# Patient Record
Sex: Female | Born: 1970 | Race: White | Hispanic: No | Marital: Married | Smoking: Never smoker
Health system: Southern US, Community
[De-identification: ages and names within clinical notes are randomized; demographics above are authoritative.]

## PROBLEM LIST (undated history)

## (undated) DIAGNOSIS — F319 Bipolar disorder, unspecified: Secondary | ICD-10-CM

## (undated) DIAGNOSIS — N200 Calculus of kidney: Secondary | ICD-10-CM

## (undated) DIAGNOSIS — I82409 Acute embolism and thrombosis of unspecified deep veins of unspecified lower extremity: Secondary | ICD-10-CM

## (undated) DIAGNOSIS — G2581 Restless legs syndrome: Secondary | ICD-10-CM

## (undated) DIAGNOSIS — K8020 Calculus of gallbladder without cholecystitis without obstruction: Secondary | ICD-10-CM

## (undated) DIAGNOSIS — K219 Gastro-esophageal reflux disease without esophagitis: Secondary | ICD-10-CM

## (undated) DIAGNOSIS — D649 Anemia, unspecified: Secondary | ICD-10-CM

## (undated) DIAGNOSIS — F419 Anxiety disorder, unspecified: Secondary | ICD-10-CM

## (undated) DIAGNOSIS — I2699 Other pulmonary embolism without acute cor pulmonale: Secondary | ICD-10-CM

## (undated) DIAGNOSIS — Z9889 Other specified postprocedural states: Secondary | ICD-10-CM

## (undated) HISTORY — PX: GASTRIC BYPASS: SHX52

## (undated) HISTORY — PX: CHOLECYSTECTOMY: SHX55

## (undated) HISTORY — PX: HERNIA REPAIR: SHX51

## (undated) HISTORY — DX: Anxiety disorder, unspecified: F41.9

## (undated) HISTORY — DX: Gastro-esophageal reflux disease without esophagitis: K21.9

## (undated) HISTORY — DX: Anemia, unspecified: D64.9

## (undated) HISTORY — DX: Calculus of gallbladder without cholecystitis without obstruction: K80.20

## (undated) HISTORY — DX: Acute embolism and thrombosis of unspecified deep veins of unspecified lower extremity: I82.409

## (undated) HISTORY — PX: KNEE ARTHROSCOPY: SUR90

## (undated) HISTORY — PX: BREAST BIOPSY: SHX20

## (undated) HISTORY — PX: TONSILLECTOMY: SUR1361

## (undated) HISTORY — PX: OTHER SURGICAL HISTORY: SHX169

---

## 2005-04-19 ENCOUNTER — Ambulatory Visit (HOSPITAL_BASED_OUTPATIENT_CLINIC_OR_DEPARTMENT_OTHER): Admission: RE | Admit: 2005-04-19 | Discharge: 2005-04-19 | Payer: Self-pay | Admitting: Orthopedic Surgery

## 2005-04-26 ENCOUNTER — Ambulatory Visit (HOSPITAL_COMMUNITY): Admission: RE | Admit: 2005-04-26 | Discharge: 2005-04-26 | Payer: Self-pay | Admitting: Orthopedic Surgery

## 2005-05-11 ENCOUNTER — Ambulatory Visit: Payer: Self-pay | Admitting: Family Medicine

## 2005-05-11 ENCOUNTER — Ambulatory Visit (HOSPITAL_COMMUNITY): Admission: RE | Admit: 2005-05-11 | Discharge: 2005-05-11 | Payer: Self-pay | Admitting: Orthopedic Surgery

## 2005-05-11 ENCOUNTER — Observation Stay (HOSPITAL_COMMUNITY): Admission: EM | Admit: 2005-05-11 | Discharge: 2005-05-14 | Payer: Self-pay | Admitting: Emergency Medicine

## 2005-05-12 ENCOUNTER — Ambulatory Visit: Payer: Self-pay | Admitting: Cardiology

## 2005-05-16 ENCOUNTER — Ambulatory Visit: Payer: Self-pay | Admitting: Family Medicine

## 2005-05-18 ENCOUNTER — Ambulatory Visit: Payer: Self-pay | Admitting: Family Medicine

## 2005-05-22 ENCOUNTER — Ambulatory Visit: Payer: Self-pay | Admitting: Family Medicine

## 2005-05-26 ENCOUNTER — Ambulatory Visit: Payer: Self-pay | Admitting: Family Medicine

## 2005-06-01 ENCOUNTER — Ambulatory Visit: Payer: Self-pay | Admitting: Family Medicine

## 2005-06-08 ENCOUNTER — Ambulatory Visit: Payer: Self-pay | Admitting: Family Medicine

## 2005-06-15 ENCOUNTER — Ambulatory Visit: Payer: Self-pay | Admitting: Sports Medicine

## 2005-06-22 ENCOUNTER — Ambulatory Visit: Payer: Self-pay | Admitting: Family Medicine

## 2005-06-29 ENCOUNTER — Ambulatory Visit: Payer: Self-pay | Admitting: Family Medicine

## 2005-07-21 ENCOUNTER — Ambulatory Visit: Payer: Self-pay | Admitting: Family Medicine

## 2005-07-28 ENCOUNTER — Ambulatory Visit: Payer: Self-pay | Admitting: Family Medicine

## 2005-08-04 ENCOUNTER — Ambulatory Visit: Payer: Self-pay | Admitting: Family Medicine

## 2005-08-17 ENCOUNTER — Ambulatory Visit: Payer: Self-pay | Admitting: Family Medicine

## 2005-08-31 ENCOUNTER — Ambulatory Visit: Payer: Self-pay | Admitting: Family Medicine

## 2005-09-14 ENCOUNTER — Ambulatory Visit: Payer: Self-pay | Admitting: Family Medicine

## 2005-09-28 ENCOUNTER — Ambulatory Visit: Payer: Self-pay | Admitting: Sports Medicine

## 2005-10-12 ENCOUNTER — Ambulatory Visit: Payer: Self-pay | Admitting: Family Medicine

## 2005-11-02 ENCOUNTER — Ambulatory Visit: Payer: Self-pay | Admitting: Family Medicine

## 2005-11-14 ENCOUNTER — Ambulatory Visit: Payer: Self-pay | Admitting: Sports Medicine

## 2005-11-26 ENCOUNTER — Inpatient Hospital Stay (HOSPITAL_COMMUNITY): Admission: AD | Admit: 2005-11-26 | Discharge: 2005-11-28 | Payer: Self-pay | Admitting: Psychiatry

## 2005-11-26 ENCOUNTER — Emergency Department (HOSPITAL_COMMUNITY): Admission: EM | Admit: 2005-11-26 | Discharge: 2005-11-26 | Payer: Self-pay | Admitting: Emergency Medicine

## 2005-11-27 ENCOUNTER — Ambulatory Visit: Payer: Self-pay | Admitting: Psychiatry

## 2005-12-04 ENCOUNTER — Other Ambulatory Visit (HOSPITAL_COMMUNITY): Admission: RE | Admit: 2005-12-04 | Discharge: 2005-12-08 | Payer: Self-pay | Admitting: Psychiatry

## 2005-12-18 HISTORY — PX: CERVICAL BIOPSY  W/ LOOP ELECTRODE EXCISION: SUR135

## 2007-02-14 DIAGNOSIS — E282 Polycystic ovarian syndrome: Secondary | ICD-10-CM | POA: Insufficient documentation

## 2007-02-14 DIAGNOSIS — E669 Obesity, unspecified: Secondary | ICD-10-CM | POA: Insufficient documentation

## 2007-02-14 DIAGNOSIS — F319 Bipolar disorder, unspecified: Secondary | ICD-10-CM | POA: Insufficient documentation

## 2007-10-08 DIAGNOSIS — E7800 Pure hypercholesterolemia, unspecified: Secondary | ICD-10-CM | POA: Insufficient documentation

## 2009-07-02 ENCOUNTER — Ambulatory Visit: Payer: Self-pay | Admitting: Family Medicine

## 2009-07-06 ENCOUNTER — Ambulatory Visit: Payer: Self-pay | Admitting: Family Medicine

## 2009-08-27 DIAGNOSIS — K769 Liver disease, unspecified: Secondary | ICD-10-CM | POA: Insufficient documentation

## 2009-10-01 DIAGNOSIS — E559 Vitamin D deficiency, unspecified: Secondary | ICD-10-CM | POA: Insufficient documentation

## 2009-11-08 HISTORY — PX: IVC FILTER INSERTION: CATH118245

## 2009-11-17 ENCOUNTER — Ambulatory Visit: Payer: Self-pay | Admitting: Internal Medicine

## 2009-11-27 ENCOUNTER — Inpatient Hospital Stay: Payer: Self-pay | Admitting: Internal Medicine

## 2009-12-06 DIAGNOSIS — R7309 Other abnormal glucose: Secondary | ICD-10-CM | POA: Insufficient documentation

## 2009-12-18 ENCOUNTER — Ambulatory Visit: Payer: Self-pay | Admitting: Internal Medicine

## 2009-12-22 ENCOUNTER — Ambulatory Visit: Payer: Self-pay | Admitting: Internal Medicine

## 2010-01-18 ENCOUNTER — Ambulatory Visit: Payer: Self-pay | Admitting: Internal Medicine

## 2010-04-27 ENCOUNTER — Emergency Department: Payer: Self-pay

## 2010-05-12 ENCOUNTER — Ambulatory Visit: Payer: Self-pay | Admitting: Urology

## 2010-05-16 ENCOUNTER — Ambulatory Visit: Payer: Self-pay | Admitting: Urology

## 2010-06-17 ENCOUNTER — Ambulatory Visit: Payer: Self-pay | Admitting: Internal Medicine

## 2010-07-14 ENCOUNTER — Ambulatory Visit: Payer: Self-pay | Admitting: Internal Medicine

## 2010-07-18 ENCOUNTER — Ambulatory Visit: Payer: Self-pay | Admitting: Internal Medicine

## 2010-12-02 ENCOUNTER — Ambulatory Visit: Payer: Self-pay | Admitting: Family Medicine

## 2010-12-05 ENCOUNTER — Ambulatory Visit: Payer: Self-pay | Admitting: Family Medicine

## 2011-02-07 ENCOUNTER — Ambulatory Visit: Payer: Self-pay | Admitting: Family Medicine

## 2011-02-14 ENCOUNTER — Ambulatory Visit: Payer: Self-pay | Admitting: Urology

## 2011-02-20 ENCOUNTER — Ambulatory Visit: Payer: Self-pay | Admitting: Urology

## 2011-06-13 ENCOUNTER — Other Ambulatory Visit: Payer: Self-pay | Admitting: Urology

## 2011-06-13 ENCOUNTER — Encounter (HOSPITAL_COMMUNITY): Payer: 59

## 2011-06-13 LAB — CBC
HCT: 42.1 % (ref 36.0–46.0)
Hemoglobin: 13.3 g/dL (ref 12.0–15.0)
MCH: 29.4 pg (ref 26.0–34.0)
MCHC: 31.6 g/dL (ref 30.0–36.0)
RBC: 4.52 MIL/uL (ref 3.87–5.11)

## 2011-06-13 LAB — PREGNANCY, URINE: Preg Test, Ur: NEGATIVE

## 2011-06-13 LAB — BASIC METABOLIC PANEL
BUN: 13 mg/dL (ref 6–23)
Chloride: 104 mEq/L (ref 96–112)
GFR calc Af Amer: >60 mL/min (ref >60)
GFR calc non Af Amer: >60 mL/min (ref >60)
Potassium: 4.3 mEq/L (ref 3.5–5.1)
Sodium: 139 mEq/L (ref 135–145)

## 2011-06-13 LAB — PROTIME-INR: INR: 1.26 (ref 0.00–1.49)

## 2011-06-13 LAB — SURGICAL PCR SCREEN: Staphylococcus aureus: NEGATIVE

## 2011-06-14 ENCOUNTER — Ambulatory Visit (HOSPITAL_COMMUNITY): Payer: 59

## 2011-06-14 ENCOUNTER — Ambulatory Visit (HOSPITAL_COMMUNITY)
Admission: RE | Admit: 2011-06-14 | Discharge: 2011-06-14 | Disposition: A | Discharge status: Home / Self Care | Payer: 59 | Source: Ambulatory Visit | Attending: Urology | Admitting: Urology

## 2011-06-14 DIAGNOSIS — Z79899 Other long term (current) drug therapy: Secondary | ICD-10-CM | POA: Insufficient documentation

## 2011-06-14 DIAGNOSIS — Z01812 Encounter for preprocedural laboratory examination: Secondary | ICD-10-CM | POA: Insufficient documentation

## 2011-06-14 DIAGNOSIS — Z9884 Bariatric surgery status: Secondary | ICD-10-CM | POA: Insufficient documentation

## 2011-06-14 DIAGNOSIS — Z86718 Personal history of other venous thrombosis and embolism: Secondary | ICD-10-CM | POA: Insufficient documentation

## 2011-06-14 DIAGNOSIS — N201 Calculus of ureter: Secondary | ICD-10-CM | POA: Insufficient documentation

## 2011-06-14 DIAGNOSIS — E7800 Pure hypercholesterolemia, unspecified: Secondary | ICD-10-CM | POA: Insufficient documentation

## 2011-06-14 DIAGNOSIS — Z7901 Long term (current) use of anticoagulants: Secondary | ICD-10-CM | POA: Insufficient documentation

## 2011-06-14 DIAGNOSIS — Z01818 Encounter for other preprocedural examination: Secondary | ICD-10-CM | POA: Insufficient documentation

## 2011-06-14 DIAGNOSIS — Z01810 Encounter for preprocedural cardiovascular examination: Secondary | ICD-10-CM | POA: Insufficient documentation

## 2011-06-14 DIAGNOSIS — I252 Old myocardial infarction: Secondary | ICD-10-CM | POA: Insufficient documentation

## 2011-06-14 NOTE — Op Note (Signed)
  NAMEEMERSON, BARRETTO              ACCOUNT NO.:  [id number]  MEDICAL RECORD NO.:  [id number]  LOCATION:  DAYL                         FACILITY:  [facility]  PHYSICIAN:  Bertram Millard. Sadi Arave, M.D.DATE OF BIRTH:  1971-03-30  DATE OF PROCEDURE:  06/14/2011 DATE OF DISCHARGE:                              OPERATIVE REPORT   PREOPERATIVE DIAGNOSIS:  A 3-mm left distal ureteral stone.  POSTOPERATIVE DIAGNOSIS:  A 3-mm left distal ureteral stone.  PRINCIPAL PROCEDURES:  Left ureteroscopic stone extraction.  SURGEON:  Bertram Millard. Bryann Mcnealy, M.D.  ANESTHESIA:  General with LMA.  COMPLICATIONS:  None.  BRIEF HISTORY:  The patient is a 40 year old female with persistent, symptomatic left distal ureteral stone.  This has not passed with conservative management using medical expulsion therapy.  She is quite symptomatic and wants the procedure done before her upcoming vacation. Risks and complications of cystoscopy, ureteroscopy and stone extraction were discussed in depth with the patient.  She understands these and desires to proceed.  DESCRIPTION OF PROCEDURE:  The patient was identified and marked in the holding area.  She received preoperative IV antibiotics.  She was taken to the operating room where general anesthetic was administered using the LMA.  She was placed in the dorsal lithotomy position.  Genitalia and perineum were prepped and draped.  Time-out was then performed.  Rigid cystoscope was passed in the bladder.  This revealed normal findings except for some inflammatory change around the left ureteral orifice.  The right ureteral orifice was normal.  I then passed a guidewire which was easily advanced in the left renal pelvis using fluoroscopic guidance.  The cystoscope was removed and the ureteroscope passed without difficulty through the ureteral orifice.  The small distal ureteral stone was encountered.  It was grasped with a nitinol basket and extracted.  Following  extraction, I inspected the entire left ureter.  No further abnormalities or stones were noted.  I decided not to leave a stent in. Toradol was given intravenously, this scope was removed and the bladder drained.  The patient tolerated the procedure well was returned to PACU in stable condition.     Bertram Millard. Retta Diones, M.D.     SMD/MEDQ  D:  06/14/2011  T:  06/14/2011  Job:  [id number]  Electronically Signed by Marcine Matar M.D. on 06/14/2011 02:46:34 PM

## 2012-08-15 ENCOUNTER — Ambulatory Visit: Payer: Self-pay | Admitting: Family Medicine

## 2012-08-15 LAB — COMPREHENSIVE METABOLIC PANEL
Albumin: 3.4 g/dL (ref 3.4–5.0)
Alkaline Phosphatase: 98 U/L (ref 50–136)
Anion Gap: 8 (ref 7–16)
BUN: 10 mg/dL (ref 7–18)
Bilirubin,Total: 0.8 mg/dL (ref 0.2–1.0)
Calcium, Total: 9 mg/dL (ref 8.5–10.1)
Chloride: 106 mmol/L (ref 98–107)
Co2: 26 mmol/L (ref 21–32)
Creatinine: 0.55 mg/dL — ABNORMAL LOW (ref 0.60–1.30)
EGFR (African American): >60
EGFR (Non-African Amer.): >60
Glucose: 83 mg/dL (ref 65–99)
Osmolality: 278 (ref 275–301)
Potassium: 3.6 mmol/L (ref 3.5–5.1)
SGOT(AST): 22 U/L (ref 15–37)
SGPT (ALT): 23 U/L (ref 12–78)
Sodium: 140 mmol/L (ref 136–145)
Total Protein: 6.5 g/dL (ref 6.4–8.2)

## 2012-08-15 LAB — URINALYSIS, COMPLETE
Leukocyte Esterase: NEGATIVE
Nitrite: NEGATIVE
Ph: 5 (ref 4.5–8.0)
Protein: NEGATIVE

## 2012-08-15 LAB — CBC WITH DIFFERENTIAL/PLATELET
Basophil %: 0.4 %
Eosinophil #: 0.2 10*3/uL (ref 0.0–0.7)
Eosinophil %: 2.5 %
HGB: 11.7 g/dL — ABNORMAL LOW (ref 12.0–16.0)
Lymphocyte %: 19.5 %
MCH: 29.8 pg (ref 26.0–34.0)
MCV: 91 fL (ref 80–100)
Monocyte %: 8.2 %
Neutrophil #: 6.7 10*3/uL — ABNORMAL HIGH (ref 1.4–6.5)
Neutrophil %: 69.4 %
RBC: 3.95 10*6/uL (ref 3.80–5.20)
WBC: 9.7 10*3/uL (ref 3.6–11.0)

## 2012-08-15 LAB — LIPASE, BLOOD: Lipase: 157 U/L (ref 73–393)

## 2012-08-16 ENCOUNTER — Emergency Department (HOSPITAL_COMMUNITY): Payer: 59

## 2012-08-16 ENCOUNTER — Emergency Department (HOSPITAL_COMMUNITY)
Admission: EM | Admit: 2012-08-16 | Discharge: 2012-08-16 | Disposition: A | Discharge status: Home / Self Care | Payer: 59 | Attending: Emergency Medicine | Admitting: Emergency Medicine

## 2012-08-16 ENCOUNTER — Encounter (HOSPITAL_COMMUNITY): Payer: Self-pay | Admitting: *Deleted

## 2012-08-16 DIAGNOSIS — R1013 Epigastric pain: Secondary | ICD-10-CM | POA: Insufficient documentation

## 2012-08-16 DIAGNOSIS — Z9089 Acquired absence of other organs: Secondary | ICD-10-CM | POA: Insufficient documentation

## 2012-08-16 DIAGNOSIS — F319 Bipolar disorder, unspecified: Secondary | ICD-10-CM | POA: Insufficient documentation

## 2012-08-16 DIAGNOSIS — Z87891 Personal history of nicotine dependence: Secondary | ICD-10-CM | POA: Insufficient documentation

## 2012-08-16 DIAGNOSIS — R109 Unspecified abdominal pain: Secondary | ICD-10-CM

## 2012-08-16 DIAGNOSIS — Z7901 Long term (current) use of anticoagulants: Secondary | ICD-10-CM | POA: Insufficient documentation

## 2012-08-16 HISTORY — DX: Restless legs syndrome: G25.81

## 2012-08-16 HISTORY — DX: Other pulmonary embolism without acute cor pulmonale: I26.99

## 2012-08-16 HISTORY — DX: Bipolar disorder, unspecified: F31.9

## 2012-08-16 HISTORY — DX: Calculus of kidney: N20.0

## 2012-08-16 LAB — CBC WITH DIFFERENTIAL/PLATELET
Basophils Absolute: 0 10*3/uL (ref 0.0–0.1)
Eosinophils Relative: 3 % (ref 0–5)
Lymphocytes Relative: 18 % (ref 12–46)
Lymphs Abs: 1.7 10*3/uL (ref 0.7–4.0)
MCV: 88.6 fL (ref 78.0–100.0)
Neutro Abs: 6.4 10*3/uL (ref 1.7–7.7)
Neutrophils Relative %: 70 % (ref 43–77)
Platelets: 387 10*3/uL (ref 150–400)
RBC: 4.28 MIL/uL (ref 3.87–5.11)
RDW: 15.3 % (ref 11.5–15.5)
WBC: 9.2 10*3/uL (ref 4.0–10.5)

## 2012-08-16 LAB — URINALYSIS, ROUTINE W REFLEX MICROSCOPIC
Bilirubin Urine: NEGATIVE
Hgb urine dipstick: NEGATIVE
Nitrite: NEGATIVE
Specific Gravity, Urine: 1.013 (ref 1.005–1.030)
Urobilinogen, UA: 0.2 mg/dL (ref 0.0–1.0)

## 2012-08-16 LAB — COMPREHENSIVE METABOLIC PANEL
ALT: 14 U/L (ref 0–35)
AST: 23 U/L (ref 0–37)
Alkaline Phosphatase: 85 U/L (ref 39–117)
CO2: 24 mEq/L (ref 19–32)
Calcium: 8.9 mg/dL (ref 8.4–10.5)
Chloride: 106 mEq/L (ref 96–112)
GFR calc Af Amer: >90 mL/min (ref >90)
GFR calc non Af Amer: >90 mL/min (ref >90)
Glucose, Bld: 77 mg/dL (ref 70–99)
Potassium: 4.7 mEq/L (ref 3.5–5.1)
Sodium: 139 mEq/L (ref 135–145)

## 2012-08-16 LAB — URINE MICROSCOPIC-ADD ON

## 2012-08-16 LAB — PROTIME-INR: Prothrombin Time: 27.1 seconds — ABNORMAL HIGH (ref 11.6–15.2)

## 2012-08-16 LAB — LIPASE, BLOOD: Lipase: 30 U/L (ref 11–59)

## 2012-08-16 MED ORDER — IOHEXOL 300 MG/ML  SOLN
100.0000 mL | Freq: Once | INTRAMUSCULAR | Status: AC | PRN
  Administered 2012-08-16: 100 mL via INTRAVENOUS

## 2012-08-16 MED ORDER — SODIUM CHLORIDE 0.9 % IV SOLN
INTRAVENOUS | Status: DC
  Administered 2012-08-16: 14:00:00 via INTRAVENOUS

## 2012-08-16 MED ORDER — PANTOPRAZOLE SODIUM 20 MG PO TBEC: 40.0000 mg | DELAYED_RELEASE_TABLET | Freq: Every day | ORAL | Status: DC

## 2012-08-16 MED ORDER — HYDROCODONE-ACETAMINOPHEN 5-500 MG PO TABS: 1.0000 | ORAL_TABLET | Freq: Four times a day (QID) | ORAL | Status: DC | PRN

## 2012-08-16 MED ORDER — SUCRALFATE 1 G PO TABS
1.0000 g | ORAL_TABLET | Freq: Once | ORAL | Status: AC
  Administered 2012-08-16: 1 g via ORAL
  Filled 2012-08-16: qty 1

## 2012-08-16 MED ORDER — PANTOPRAZOLE SODIUM 40 MG PO TBEC
40.0000 mg | DELAYED_RELEASE_TABLET | Freq: Once | ORAL | Status: AC
  Administered 2012-08-16: 40 mg via ORAL
  Filled 2012-08-16: qty 1

## 2012-08-16 MED ORDER — SODIUM CHLORIDE 0.9 % IV BOLUS (SEPSIS)
1000.0000 mL | Freq: Once | INTRAVENOUS | Status: AC
  Administered 2012-08-16: 1000 mL via INTRAVENOUS

## 2012-08-16 MED ORDER — HYDROCODONE-ACETAMINOPHEN 5-325 MG PO TABS
1.0000 | ORAL_TABLET | Freq: Once | ORAL | Status: AC
  Administered 2012-08-16: 1 via ORAL
  Filled 2012-08-16: qty 1

## 2012-08-16 MED ORDER — SUCRALFATE 1 G PO TABS: ORAL_TABLET | ORAL | Status: DC

## 2012-08-16 MED ORDER — HYDROMORPHONE HCL PF 1 MG/ML IJ SOLN
1.0000 mg | Freq: Once | INTRAMUSCULAR | Status: AC
  Administered 2012-08-16: 1 mg via INTRAVENOUS
  Filled 2012-08-16: qty 1

## 2012-08-16 MED ORDER — ONDANSETRON HCL 4 MG/2ML IJ SOLN
4.0000 mg | Freq: Once | INTRAMUSCULAR | Status: AC
  Administered 2012-08-16: 4 mg via INTRAVENOUS
  Filled 2012-08-16: qty 2

## 2012-08-16 NOTE — ED Notes (Signed)
Rick, PA at bedside. 

## 2012-08-16 NOTE — ED Provider Notes (Signed)
History     CSN: [id number]  Arrival date & time 08/16/12  1140   First MD Initiated Contact with Patient 08/16/12 1214      Chief Complaint  Patient presents with  . Abdominal Pain  . Nausea    (Consider location/radiation/quality/duration/timing/severity/associated sxs/prior treatment) HPI Comments: Seen yest at [facility] ED.  States she had small amount of blood in her urine and was thought to have a kidney stone.  Told to return today for a scheduled CT to further evaluate.  H/o kidney stones.  She primarily complains of episgastric pain, "burning" which is worse with eating or drinking..  Has only had a couple episodes of vomiting.  Has been having pain ~ 2 weeks although worse in past 5 days and acutely worse since yest.  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  No leg pain or trauma.  No CP, SOB or hemoptysis.  No change in coumadin dosage.  S/p cholecystectomy and gastric bypass surgery.  Has GI MD in [place].  Patient is a 41 y.o. female presenting with abdominal pain. The history is provided by the patient. No language interpreter was used.  Abdominal Pain The primary symptoms of the illness include abdominal pain, nausea and vomiting. The primary symptoms of the illness do not include fever, diarrhea, dysuria, vaginal discharge or vaginal bleeding. The onset of the illness was gradual.  The illness is associated with eating. The patient states that she believes she is currently not pregnant. The patient has not had a change in bowel habit. Risk factors for an acute abdominal problem include a history of abdominal surgery. Additional symptoms associated with the illness include anorexia and back pain. Symptoms associated with the illness do not include diaphoresis, constipation, urgency, hematuria or frequency. Significant associated medical issues do not include PUD, GERD, inflammatory bowel disease, diabetes, sickle cell disease, gallstones, liver disease, substance abuse,  diverticulitis, HIV or cardiac disease.    Past Medical History  Diagnosis Date  . Kidney stones   . Pulmonary embolism   . RLS (restless legs syndrome)   . Bipolar disorder     Past Surgical History  Procedure Date  . Gastric bypass   . Tonsillectomy   . Kidney stone removal   . Knee arthroscopy   . Cholecystectomy     No family history on file.  History  Substance Use Topics  . Smoking status: Former Games developer  . Smokeless tobacco: Not on file  . Alcohol Use: No    OB History    Grav Para Term Preterm Abortions TAB SAB Ect Mult Living                  Review of Systems  Constitutional: Negative for fever and diaphoresis.  Gastrointestinal: Positive for nausea, vomiting, abdominal pain and anorexia. Negative for diarrhea and constipation.  Genitourinary: Negative for dysuria, urgency, frequency, hematuria, vaginal bleeding and vaginal discharge.  Musculoskeletal: Positive for back pain.  All other systems reviewed and are negative.    Allergies  Review of patient's allergies indicates no known allergies.  Home Medications   Current Outpatient Rx  Name Route Sig Dispense Refill  . CLONAZEPAM 0.5 MG PO TABS Oral Take 0.5 mg by mouth 2 (two) times daily as needed. Anxiety    . LAMOTRIGINE 200 MG PO TABS Oral Take 200 mg by mouth daily.    Marland Kitchen PRAMIPEXOLE DIHYDROCHLORIDE 0.5 MG PO TABS Oral Take 1 mg by mouth at bedtime and may  repeat dose one time if needed. Take two 0.5mg  (1 mg total)    . WARFARIN SODIUM 5 MG PO TABS Oral Take 10-15 mg by mouth daily. Take three tablets (15 mg total) on Monday, Wednesday, Friday; then take two tablets (10 mg total) on Sunday, Tuesday, Thursday Saturday    . HYDROCODONE-ACETAMINOPHEN 5-500 MG PO TABS Oral Take 1 tablet by mouth every 6 (six) hours as needed for pain. 20 tablet 0  . PANTOPRAZOLE SODIUM 20 MG PO TBEC Oral Take 2 tablets (40 mg total) by mouth daily. 14 tablet 0  . SUCRALFATE 1 G PO TABS  One tablet 1 hr before meals  and at bedtime 56 tablet 0    BP 99/51  Pulse 65  Temp 98.2 F (36.8 C) (Oral)  Resp 18  Wt 150 lb (68.04 kg)  SpO2 99%  LMP 08/02/2012  Physical Exam  Nursing note and vitals reviewed. Constitutional: She is oriented to person, place, and time. She appears well-developed and well-nourished. No distress.  HENT:  Head: Normocephalic and atraumatic.  Eyes: EOM are normal.  Neck: Normal range of motion.  Cardiovascular: Normal rate, regular rhythm, normal heart sounds and normal pulses.   No extrasystoles are present.  Pulmonary/Chest: Effort normal and breath sounds normal. No accessory muscle usage. Not tachypneic. No respiratory distress. She has no decreased breath sounds. She exhibits no tenderness.  Abdominal: Soft. Normal appearance and bowel sounds are normal. She exhibits no distension and no mass. There is tenderness in the epigastric area. There is CVA tenderness. There is no rebound, no guarding, no tenderness at McBurney's point and negative Murphy's sign.         + L CVAT  Musculoskeletal: Normal range of motion.  Neurological: She is alert and oriented to person, place, and time.  Skin: Skin is warm and dry.  Psychiatric: She has a normal mood and affect. Judgment normal.    ED Course  Procedures (including critical care time)  Labs Reviewed  COMPREHENSIVE METABOLIC PANEL - Abnormal; Notable for the following:    Albumin 3.4 (*)     All other components within normal limits  URINALYSIS, ROUTINE W REFLEX MICROSCOPIC - Abnormal; Notable for the following:    Ketones, ur TRACE (*)     Leukocytes, UA SMALL (*)     All other components within normal limits  PROTIME-INR - Abnormal; Notable for the following:    Prothrombin Time 27.1 (*)     INR 2.46 (*)     All other components within normal limits  CBC WITH DIFFERENTIAL  POCT PREGNANCY, URINE  LIPASE, BLOOD  URINE MICROSCOPIC-ADD ON   Ct Abdomen Pelvis W Contrast  08/16/2012  *RADIOLOGY REPORT*  Clinical  Data: Persistent epigastric pain, history of gastric bypass surgery  CT ABDOMEN AND PELVIS WITH CONTRAST  Technique:  Multidetector CT imaging of the abdomen and pelvis was performed following the standard protocol during bolus administration of intravenous contrast. Sagittal and coronal MPR images reconstructed from axial data set.  Contrast: OMNIPAQUE IOHEXOL 300 MG/ML  SOLN Dilute oral contrast.  Comparison: 04/18/2012  Findings: Minimal dependent atelectasis at lung bases. Post cholecystectomy and gastric bypass surgery. Linear high attenuation focus within inferior right lobe of liver, unchanged. Liver, spleen, pancreas, kidneys, and adrenal glands normal appearance. IVC filter present with the distal aspects of the limbs extending external to the IVC. Minimal scattered atherosclerotic calcification.  Stomach decompressed without evidence of anastomotic obstruction. Large and small bowel loops normal appearance. Unremarkable bladder,  ureters, and appendix. IUD within uterus. Normal sized ovaries and tiny amount of cul-de-sac free pelvic fluid noted. No mass, adenopathy, free fluid or inflammatory process. No acute osseous findings.  IMPRESSION: No acute intra abdominal or intrapelvic abnormalities. Distal aspects of the limbs of the IVC filter are again noted to extend external to the IVC.   Original Report Authenticated By: Lollie Marrow, M.D.      1. Abdominal pain       MDM          Evalina Field, PA 09/05/12 [id number]

## 2012-08-16 NOTE — ED Notes (Signed)
Pt states "was seen @ an UC in [place] yesterday, had blood work done, was supposed to have a CT @ 2:15 today, I was told it was probably kidney stones but if the pain got too bad to go to the ER, I have a hx of kidney stones and go to [facility] and they've always told me to come here, I've felt so bloated, my stomach burns even when I drink water."

## 2012-08-16 NOTE — ED Notes (Signed)
Pt returned from CT scan. NAD noted. 

## 2012-08-16 NOTE — ED Notes (Signed)
Pt currently drinking contrast

## 2012-08-16 NOTE — ED Notes (Signed)
Pt also states "they told me @ UC I was dehydrated, my back hurts but that only started last night"

## 2012-08-16 NOTE — ED Notes (Signed)
Pt getting dressed. Waiting for discharge paperwork. NAD noted at this time.

## 2012-08-16 NOTE — ED Notes (Signed)
Patient transported to CT 

## 2012-08-21 ENCOUNTER — Ambulatory Visit (INDEPENDENT_AMBULATORY_CARE_PROVIDER_SITE_OTHER): Payer: 59 | Admitting: Family Medicine

## 2012-08-21 VITALS — BP 102/65 | HR 73 | Temp 98.3°F | Resp 18 | Ht 67.75 in | Wt 165.0 lb

## 2012-08-21 DIAGNOSIS — R1013 Epigastric pain: Secondary | ICD-10-CM

## 2012-08-21 DIAGNOSIS — R110 Nausea: Secondary | ICD-10-CM

## 2012-08-21 LAB — POCT CBC
HCT, POC: 39 % (ref 37.7–47.9)
Hemoglobin: 11.9 g/dL — AB (ref 12.2–16.2)
MCH, POC: 28.1 pg (ref 27–31.2)
MCV: 92.1 fL (ref 80–97)
MPV: 8.1 fL (ref 0–99.8)
POC MID %: 5.9 %M (ref 0–12)
RBC: 4.23 M/uL (ref 4.04–5.48)
WBC: 8.5 10*3/uL (ref 4.6–10.2)

## 2012-08-21 MED ORDER — SUCRALFATE 1 G PO TABS: 1.0000 g | ORAL_TABLET | Freq: Four times a day (QID) | ORAL | Status: DC

## 2012-08-21 MED ORDER — HYDROCODONE-ACETAMINOPHEN 5-500 MG PO TABS: 1.0000 | ORAL_TABLET | Freq: Four times a day (QID) | ORAL | Status: AC | PRN

## 2012-08-21 MED ORDER — PANTOPRAZOLE SODIUM 40 MG PO TBEC: 40.0000 mg | DELAYED_RELEASE_TABLET | Freq: Every day | ORAL | Status: DC

## 2012-08-21 MED ORDER — PANTOPRAZOLE SODIUM 20 MG PO TBEC: 40.0000 mg | DELAYED_RELEASE_TABLET | Freq: Every day | ORAL | Status: DC

## 2012-08-21 MED ORDER — HYDROCODONE-ACETAMINOPHEN 5-500 MG PO TABS: 1.0000 | ORAL_TABLET | Freq: Four times a day (QID) | ORAL | Status: DC | PRN

## 2012-08-21 NOTE — Progress Notes (Signed)
Subjective:    Patient ID: Kristen [name], female    DOB: [birth year], 41 y.o.   MRN: [id number]  HPIThis 41 y.o. female presents for evaluation of abdominal pain.  Onset upper abdomen with burning and radiates down into abdomen. Diffuse abdominal pain; +burning pain.  Diffuse abdominal cramping like labor pains. Eating has no effect; does not make better or worse.  Severe pain x 10 minutes and then improves.  S/p ED visit on Friday [facility]; s/p labs, urine, negative.  S/p CT with contrast; no kidney stones; no blockage.  Diagnosed with GI process/etiology.  Recommended referral to GI specialist.  Discharged on Protonix 40mg  daily and Carafate to coat stomach; also prescribed hydrocodone at night.  Carafate pills are huge.  Really sore epigastric region.  When drinks coffee, no pain.  Could eat chicken noodle soup with burning.  Trying to eat Activia yogurt; also eating Boost.  Eating makes worse but will start spontaneously.  At night, taking Coumadin, Mirapex.  Crying and screaming after taking medication with water.  S/p cholelithiasis.  Mother has similar symptoms but has H. Pylori.  Some people suggest gastritis.  +very sore in epigastric region.  +nausea; no vomiting; at first of August went on trip with son's school.  Twice after eating dinner, started vomiting.  Has only vomited x 2 since surgery.  Burning started after vomiting. Last week, drank Maalox without improvement.  Having normal bowel movements; has not had bowel movement in past two days but has taken hydrocodone; +bloating; +abdominal swelling.  No diarrhea; no black stools. No urinary symptoms; no flank pain.  Does not drink a lot.  Pain not horrible in mornings.  Milk helps a little.  Has not been weighing so not sure wight.   Review of Systems  Constitutional: Positive for appetite change. Negative for fever, chills, diaphoresis, activity change, fatigue and unexpected weight change.  Respiratory: Negative for shortness of breath.     Cardiovascular: Negative for chest pain, palpitations and leg swelling.  Gastrointestinal: Positive for nausea, vomiting, abdominal pain and rectal pain. Negative for diarrhea, constipation, blood in stool, abdominal distention and anal bleeding.  Genitourinary: Negative for dysuria, urgency, frequency, hematuria, flank pain, vaginal pain and dyspareunia.    Past Medical History  Diagnosis Date  . Kidney stones   . Pulmonary embolism   . RLS (restless legs syndrome)   . Bipolar disorder   . DVT (deep venous thrombosis)   . Anxiety   . GERD (gastroesophageal reflux disease)   . Cholelithiasis     Past Surgical History  Procedure Date  . Gastric bypass   . Tonsillectomy   . Kidney stone removal   . Knee arthroscopy   . Cholecystectomy   . Hernia repair     Prior to Admission medications   Medication Sig Start Date End Date Taking? Authorizing Provider  clonazePAM (KLONOPIN) 0.5 MG tablet Take 0.5 mg by mouth 2 (two) times daily as needed. Anxiety   Yes Historical Provider, MD  lamoTRIgine (LAMICTAL) 200 MG tablet Take 200 mg by mouth daily.   Yes Historical Provider, MD  pantoprazole (PROTONIX) 40 MG tablet Take 1 tablet (40 mg total) by mouth daily. 08/21/12 08/21/13 Yes Ethelda Chick, MD  pramipexole (MIRAPEX) 0.5 MG tablet Take 1 mg by mouth at bedtime and may repeat dose one time if needed. Take two 0.5mg  (1 mg total)   Yes Historical Provider, MD  sucralfate (CARAFATE) 1 G tablet Take 1 tablet (1 g total) by mouth  4 (four) times daily. One tablet 1 hr before meals and at bedtime 08/21/12  Yes Ethelda Chick, MD  warfarin (COUMADIN) 5 MG tablet Take 10-15 mg by mouth daily. Take three tablets (15 mg total) on Monday, Wednesday, Friday; then take two tablets (10 mg total) on Sunday, Tuesday, Thursday Saturday   Yes Historical Provider, MD    No Known Allergies  History   Social History  . Marital Status: Married    Spouse Name: N/A    Number of Children: 3  . Years of  Education: N/A   Occupational History  . teacher's assistant    Social History Main Topics  . Smoking status: Former Games developer  . Smokeless tobacco: Not on file  . Alcohol Use: No  . Drug Use: No  . Sexually Active: Yes   Other Topics Concern  . Not on file   Social History Narrative  . No narrative on file    Family History  Problem Relation Age of Onset  . Diabetes Father   . Stroke Father   . Heart attack Father   . Kidney failure Father   . Hypertension Father   . Colon cancer Paternal Grandfather   . Hypertension Paternal Grandfather   . Breast cancer Paternal Grandmother   . Diabetes Maternal Grandfather   . Stroke Maternal Grandfather   . Uterine cancer Maternal Grandmother   . Ulcers Maternal Grandmother        Objective:   Physical Exam  Nursing note and vitals reviewed. Constitutional: She is oriented to person, place, and time. She appears well-developed and well-nourished. No distress.  HENT:  Mouth/Throat: Oropharynx is clear and moist.  Eyes: Conjunctivae normal are normal. Pupils are equal, round, and reactive to light.  Neck: Normal range of motion. Neck supple. No thyromegaly present.  Cardiovascular: Normal rate and normal heart sounds.   Pulmonary/Chest: Effort normal and breath sounds normal. No respiratory distress. She has no wheezes. She has no rales.  Abdominal: Soft. Bowel sounds are normal. She exhibits no distension and no mass. There is no hepatosplenomegaly. There is tenderness in the epigastric area. There is no rebound, no guarding and no CVA tenderness. No hernia.  Lymphadenopathy:    She has no cervical adenopathy.  Neurological: She is alert and oriented to person, place, and time.  Skin: Skin is warm and dry. She is not diaphoretic.  Psychiatric: She has a normal mood and affect. Her behavior is normal. Judgment and thought content normal.       Assessment & Plan:   1. Abdominal pain, epigastric  H. pylori antibody, IgG, POCT  CBC, Amylase, Lipase, Ambulatory referral to Gastroenterology, pantoprazole (PROTONIX) 40 MG tablet, sucralfate (CARAFATE) 1 G tablet, HYDROcodone-acetaminophen (VICODIN) 5-500 MG per tablet, DISCONTINUED: pantoprazole (PROTONIX) 20 MG tablet, DISCONTINUED: sucralfate (CARAFATE) 1 G tablet, DISCONTINUED: HYDROcodone-acetaminophen (VICODIN) 5-500 MG per tablet  2. Nausea  Ambulatory referral to Gastroenterology  3.  S/p gastric bypass for obesity.  1.  Epigastric Abdominal Pain: New.  Associated with nausea, anorexia.  Suggestive of PUD versus gastritis.  Increase dose of Protonix; rx for Carafate.  Refer to GI. Refill of Vicodin to use sparingly at night. 2. Nausea: New. Associated with epigastric abdominal pain.  BRAT diet, hydrate. 3.  S/p gastric bypass for obesity.  Meds ordered this encounter  Medications  . DISCONTD: pantoprazole (PROTONIX) 20 MG tablet    Sig: Take 2 tablets (40 mg total) by mouth daily.    Dispense:  60 tablet  Refill:  2  . DISCONTD: sucralfate (CARAFATE) 1 G tablet    Sig: Take 1 tablet (1 g total) by mouth 4 (four) times daily. One tablet 1 hr before meals and at bedtime    Dispense:  120 tablet    Refill:  3    Liquid if available  . DISCONTD: HYDROcodone-acetaminophen (VICODIN) 5-500 MG per tablet    Sig: Take 1 tablet by mouth every 6 (six) hours as needed for pain.    Dispense:  40 tablet    Refill:  0  . pantoprazole (PROTONIX) 40 MG tablet    Sig: Take 1 tablet (40 mg total) by mouth daily.    Dispense:  60 tablet    Refill:  2  . sucralfate (CARAFATE) 1 G tablet    Sig: Take 1 tablet (1 g total) by mouth 4 (four) times daily. One tablet 1 hr before meals and at bedtime    Dispense:  120 tablet    Refill:  3    Liquid if available  . HYDROcodone-acetaminophen (VICODIN) 5-500 MG per tablet    Sig: Take 1 tablet by mouth every 6 (six) hours as needed for pain.    Dispense:  40 tablet    Refill:  0

## 2012-08-21 NOTE — Patient Instructions (Addendum)
1. Abdominal pain, epigastric  H. pylori antibody, IgG, POCT CBC, Amylase, Lipase, Ambulatory referral to Gastroenterology, pantoprazole (PROTONIX) 40 MG tablet, sucralfate (CARAFATE) 1 G tablet, HYDROcodone-acetaminophen (VICODIN) 5-500 MG per tablet, DISCONTINUED: pantoprazole (PROTONIX) 20 MG tablet, DISCONTINUED: sucralfate (CARAFATE) 1 G tablet, DISCONTINUED: HYDROcodone-acetaminophen (VICODIN) 5-500 MG per tablet  2. Nausea  Ambulatory referral to Gastroenterology   Peptic Ulcers Ulcers are small, open craters or sores that develop in the lining of the stomach or the duodenum (the first part of the small intestine). The term peptic ulcer is used to describe both types of ulcers. There are a number of treatments that relieve the discomfort of ulcers. In most cases ulcers do heal.  CAUSES AND COMMON FEATURES OF PEPTIC ULCERS  Peptic ulcers occur only in areas of the digestive system that come in contact with digestive juices. These juices are secreted (given off) by the stomach. They include acid and an enzyme called pepsin that breaks down proteins. Many people with duodenal ulcers have too much digestive juice spilling down from the stomach. Most people with gastric (stomach) ulcers have normal or below normal amounts of stomach acid. Sometimes, when the mucous membrane (protective lining) of the stomach and duodenum does not protect well, this may add to the growth of ulcers. Duodenal ulcers often produces pain in a small area between the breastbone and navel. Pain varies from hunger pain to constant gnawing or burning sensations (feeling). Sometimes the pain is felt during sleep and may awaken the person in the middle of the night. Often the pain occurs two or three hours after eating, when the stomach is empty. Other common symptoms (problems) include overeating for pain relief. Eating relieves the pain of a duodenal ulcer. Gastric ulcer pain may be felt in the same place as the pain of a duodenal  ulcer, or slightly higher up. There may also be sensations of feeling full, indigestion, and heartburn. Sometimes pain occurs when the stomach is full. This causes loss of appetite followed by weight loss. HOME CARE INSTRUCTIONS   Use of tobacco products have been found to slow down the healing of an ulcer. STOP SMOKING.   Avoid alcohol, aspirin, and other inflammation (swelling and soreness) reducing drugs. These substances weaken the stomach lining.   Eat regular, nutritious meals.   Avoid foods that bother you.   Take medications and antacids as directed. Over-the-counter medications are used to neutralize stomach acid. Prescription medications reduce acid secretion, block acid production, or provide a protective coating over the ulcer. If a specific antacid was prescribed, do not switch brands without your caregiver's approval.  Surgery is usually not necessary. Diet and/or drug therapy usually is effective. Surgery may be necessary if perforation, obstruction due to scarring, or uncontrollable bleeding is found, or if severe pain is not otherwise controlled. SEEK IMMEDIATE MEDICAL CARE IF:  You see signs of bleeding. This includes vomiting fresh, bright red blood or passing bloody or tarry, black stools.   You suffer weakness, fatigue, or loss of consciousness. These symptoms can result from severe hemorrhaging (bleeding). Shock may result.   You have sudden, intense, severe abdominal (belly) pain. This is the first sign of a perforation. This would require immediate surgical treatment.   You have intense pain and continued vomiting. This could signal an obstruction of the digestive tract.  Document Released: 12/01/2000 Document Revised: 11/23/2011 Document Reviewed: 11/30/2008 [facility] Patient Information 2012 [place], [place].

## 2012-08-23 ENCOUNTER — Telehealth: Payer: Self-pay

## 2012-08-23 NOTE — Telephone Encounter (Signed)
Please advise. Patient was seen 9/4 for abdominal pain, states no relief with Vicodin. Waiting on GI appt and Hpylori labs pending.

## 2012-08-23 NOTE — Telephone Encounter (Signed)
Pt states that she is still experiencing abdominal pain. She states that the vicoden is not helping her pain and would like to know what can be done until she has an appt with the gastroenterologist Best# [phone]

## 2012-08-23 NOTE — Telephone Encounter (Signed)
Call --- patient can take 2 Vicodin at one time if needed for pain.  Also continue Protonix and Carafate/Sulcrulfate.  KMS

## 2012-08-24 NOTE — Telephone Encounter (Signed)
LMOM TO CB 

## 2012-08-24 NOTE — Telephone Encounter (Signed)
Advised pt of Dr. Lonn Georgia Note

## 2012-08-28 DIAGNOSIS — R1013 Epigastric pain: Secondary | ICD-10-CM | POA: Insufficient documentation

## 2012-09-05 NOTE — ED Provider Notes (Signed)
Medical screening examination/treatment/procedure(s) were performed by non-physician practitioner and as supervising physician I was immediately available for consultation/collaboration.  Juliet Rude. Rubin Payor, MD 09/05/12 1102

## 2012-09-17 ENCOUNTER — Encounter: Payer: Self-pay | Admitting: Family Medicine

## 2012-09-17 DIAGNOSIS — R1013 Epigastric pain: Secondary | ICD-10-CM

## 2012-09-27 NOTE — Progress Notes (Signed)
Reviewed and agree.

## 2013-01-27 ENCOUNTER — Encounter: Payer: Self-pay | Admitting: Obstetrics & Gynecology

## 2013-01-27 ENCOUNTER — Ambulatory Visit (INDEPENDENT_AMBULATORY_CARE_PROVIDER_SITE_OTHER): Payer: BC Managed Care – PPO | Admitting: Obstetrics & Gynecology

## 2013-01-27 VITALS — BP 113/77 | HR 79 | Ht 69.0 in | Wt 175.0 lb

## 2013-01-27 DIAGNOSIS — Z124 Encounter for screening for malignant neoplasm of cervix: Secondary | ICD-10-CM

## 2013-01-27 DIAGNOSIS — Z1151 Encounter for screening for human papillomavirus (HPV): Secondary | ICD-10-CM

## 2013-01-27 DIAGNOSIS — Z01419 Encounter for gynecological examination (general) (routine) without abnormal findings: Secondary | ICD-10-CM

## 2013-01-27 NOTE — Patient Instructions (Signed)
Preventive Care for Adults, Female A healthy lifestyle and preventive care can promote health and wellness. Preventive health guidelines for women include the following key practices.  A routine yearly physical is a good way to check with your caregiver about your health and preventive screening. It is a chance to share any concerns and updates on your health, and to receive a thorough exam.  Visit your dentist for a routine exam and preventive care every 6 months. Brush your teeth twice a day and floss once a day. Good oral hygiene prevents tooth decay and gum disease.  The frequency of eye exams is based on your age, health, family medical history, use of contact lenses, and other factors. Follow your caregiver's recommendations for frequency of eye exams.  Eat a healthy diet. Foods like vegetables, fruits, whole grains, low-fat dairy products, and lean protein foods contain the nutrients you need without too many calories. Decrease your intake of foods high in solid fats, added sugars, and salt. Eat the right amount of calories for you.Get information about a proper diet from your caregiver, if necessary.  Regular physical exercise is one of the most important things you can do for your health. Most adults should get at least 150 minutes of moderate-intensity exercise (any activity that increases your heart rate and causes you to sweat) each week. In addition, most adults need muscle-strengthening exercises on 2 or more days a week.  Maintain a healthy weight. The body mass index (BMI) is a screening tool to identify possible weight problems. It provides an estimate of body fat based on height and weight. Your caregiver can help determine your BMI, and can help you achieve or maintain a healthy weight.For adults 20 years and older:  A BMI below 18.5 is considered underweight.  A BMI of 18.5 to 24.9 is normal.  A BMI of 25 to 29.9 is considered overweight.  A BMI of 30 and above is  considered obese.  Maintain normal blood lipids and cholesterol levels by exercising and minimizing your intake of saturated fat. Eat a balanced diet with plenty of fruit and vegetables. Blood tests for lipids and cholesterol should begin at age 20 and be repeated every 5 years. If your lipid or cholesterol levels are high, you are over 50, or you are at high risk for heart disease, you may need your cholesterol levels checked more frequently.Ongoing high lipid and cholesterol levels should be treated with medicines if diet and exercise are not effective.  If you smoke, find out from your caregiver how to quit. If you do not use tobacco, do not start.  If you are pregnant, do not drink alcohol. If you are breastfeeding, be very cautious about drinking alcohol. If you are not pregnant and choose to drink alcohol, do not exceed 1 drink per day. One drink is considered to be 12 ounces (355 mL) of beer, 5 ounces (148 mL) of wine, or 1.5 ounces (44 mL) of liquor.  Avoid use of street drugs. Do not share needles with anyone. Ask for help if you need support or instructions about stopping the use of drugs.  High blood pressure causes heart disease and increases the risk of stroke. Your blood pressure should be checked at least every 1 to 2 years. Ongoing high blood pressure should be treated with medicines if weight loss and exercise are not effective.  If you are 55 to 41 years old, ask your caregiver if you should take aspirin to prevent strokes.  Diabetes   screening involves taking a blood sample to check your fasting blood sugar level. This should be done once every 3 years, after age 45, if you are within normal weight and without risk factors for diabetes. Testing should be considered at a younger age or be carried out more frequently if you are overweight and have at least 1 risk factor for diabetes.  Breast cancer screening is essential preventive care for women. You should practice "breast  self-awareness." This means understanding the normal appearance and feel of your breasts and may include breast self-examination. Any changes detected, no matter how small, should be reported to a caregiver. Women in their 20s and 30s should have a clinical breast exam (CBE) by a caregiver as part of a regular health exam every 1 to 3 years. After age 40, women should have a CBE every year. Starting at age 40, women should consider having a mammography (breast X-ray test) every year. Women who have a family history of breast cancer should talk to their caregiver about genetic screening. Women at a high risk of breast cancer should talk to their caregivers about having magnetic resonance imaging (MRI) and a mammography every year.  The Pap test is a screening test for cervical cancer. A Pap test can show cell changes on the cervix that might become cervical cancer if left untreated. A Pap test is a procedure in which cells are obtained and examined from the lower end of the uterus (cervix).  Women should have a Pap test starting at age 21.  Between ages 21 and 29, Pap tests should be repeated every 2 years.  Beginning at age 30, you should have a Pap test every 3 years as long as the past 3 Pap tests have been normal.  Some women have medical problems that increase the chance of getting cervical cancer. Talk to your caregiver about these problems. It is especially important to talk to your caregiver if a new problem develops soon after your last Pap test. In these cases, your caregiver may recommend more frequent screening and Pap tests.  The above recommendations are the same for women who have or have not gotten the vaccine for human papillomavirus (HPV).  If you had a hysterectomy for a problem that was not cancer or a condition that could lead to cancer, then you no longer need Pap tests. Even if you no longer need a Pap test, a regular exam is a good idea to make sure no other problems are  starting.  If you are between ages 65 and 70, and you have had normal Pap tests going back 10 years, you no longer need Pap tests. Even if you no longer need a Pap test, a regular exam is a good idea to make sure no other problems are starting.  If you have had past treatment for cervical cancer or a condition that could lead to cancer, you need Pap tests and screening for cancer for at least 20 years after your treatment.  If Pap tests have been discontinued, risk factors (such as a new sexual partner) need to be reassessed to determine if screening should be resumed.  The HPV test is an additional test that may be used for cervical cancer screening. The HPV test looks for the virus that can cause the cell changes on the cervix. The cells collected during the Pap test can be tested for HPV. The HPV test could be used to screen women aged 30 years and older, and should   be used in women of any age who have unclear Pap test results. After the age of 30, women should have HPV testing at the same frequency as a Pap test.  Colorectal cancer can be detected and often prevented. Most routine colorectal cancer screening begins at the age of 50 and continues through age 75. However, your caregiver may recommend screening at an earlier age if you have risk factors for colon cancer. On a yearly basis, your caregiver may provide home test kits to check for hidden blood in the stool. Use of a small camera at the end of a tube, to directly examine the colon (sigmoidoscopy or colonoscopy), can detect the earliest forms of colorectal cancer. Talk to your caregiver about this at age 50, when routine screening begins. Direct examination of the colon should be repeated every 5 to 10 years through age 75, unless early forms of pre-cancerous polyps or small growths are found.  Hepatitis C blood testing is recommended for all people born from 1945 through 1965 and any individual with known risks for hepatitis C.  Practice  safe sex. Use condoms and avoid high-risk sexual practices to reduce the spread of sexually transmitted infections (STIs). STIs include gonorrhea, chlamydia, syphilis, trichomonas, herpes, HPV, and human immunodeficiency virus (HIV). Herpes, HIV, and HPV are viral illnesses that have no cure. They can result in disability, cancer, and death. Sexually active women aged 25 and younger should be checked for chlamydia. Older women with new or multiple partners should also be tested for chlamydia. Testing for other STIs is recommended if you are sexually active and at increased risk.  Osteoporosis is a disease in which the bones lose minerals and strength with aging. This can result in serious bone fractures. The risk of osteoporosis can be identified using a bone density scan. Women ages 65 and over and women at risk for fractures or osteoporosis should discuss screening with their caregivers. Ask your caregiver whether you should take a calcium supplement or vitamin D to reduce the rate of osteoporosis.  Menopause can be associated with physical symptoms and risks. Hormone replacement therapy is available to decrease symptoms and risks. You should talk to your caregiver about whether hormone replacement therapy is right for you.  Use sunscreen with sun protection factor (SPF) of 30 or more. Apply sunscreen liberally and repeatedly throughout the day. You should seek shade when your shadow is shorter than you. Protect yourself by wearing long sleeves, pants, a wide-brimmed hat, and sunglasses year round, whenever you are outdoors.  Once a month, do a whole body skin exam, using a mirror to look at the skin on your back. Notify your caregiver of new moles, moles that have irregular borders, moles that are larger than a pencil eraser, or moles that have changed in shape or color.  Stay current with required immunizations.  Influenza. You need a dose every fall (or winter). The composition of the flu vaccine  changes each year, so being vaccinated once is not enough.  Pneumococcal polysaccharide. You need 1 to 2 doses if you smoke cigarettes or if you have certain chronic medical conditions. You need 1 dose at age 65 (or older) if you have never been vaccinated.  Tetanus, diphtheria, pertussis (Tdap, Td). Get 1 dose of Tdap vaccine if you are younger than age 65, are over 65 and have contact with an infant, are a healthcare worker, are pregnant, or simply want to be protected from whooping cough. After that, you need a Td   booster dose every 10 years. Consult your caregiver if you have not had at least 3 tetanus and diphtheria-containing shots sometime in your life or have a deep or dirty wound.  HPV. You need this vaccine if you are a woman age 26 or younger. The vaccine is given in 3 doses over 6 months.  Measles, mumps, rubella (MMR). You need at least 1 dose of MMR if you were born in 1957 or later. You may also need a second dose.  Meningococcal. If you are age 19 to 21 and a first-year college student living in a residence hall, or have one of several medical conditions, you need to get vaccinated against meningococcal disease. You may also need additional booster doses.  Zoster (shingles). If you are age 60 or older, you should get this vaccine.  Varicella (chickenpox). If you have never had chickenpox or you were vaccinated but received only 1 dose, talk to your caregiver to find out if you need this vaccine.  Hepatitis A. You need this vaccine if you have a specific risk factor for hepatitis A virus infection or you simply wish to be protected from this disease. The vaccine is usually given as 2 doses, 6 to 18 months apart.  Hepatitis B. You need this vaccine if you have a specific risk factor for hepatitis B virus infection or you simply wish to be protected from this disease. The vaccine is given in 3 doses, usually over 6 months. Preventive Services / Frequency Ages 19 to 39  Blood  pressure check.** / Every 1 to 2 years.  Lipid and cholesterol check.** / Every 5 years beginning at age 20.  Clinical breast exam.** / Every 3 years for women in their 20s and 30s.  Pap test.** / Every 2 years from ages 21 through 29. Every 3 years starting at age 30 through age 65 or 70 with a history of 3 consecutive normal Pap tests.  HPV screening.** / Every 3 years from ages 30 through ages 65 to 70 with a history of 3 consecutive normal Pap tests.  Hepatitis C blood test.** / For any individual with known risks for hepatitis C.  Skin self-exam. / Monthly.  Influenza immunization.** / Every year.  Pneumococcal polysaccharide immunization.** / 1 to 2 doses if you smoke cigarettes or if you have certain chronic medical conditions.  Tetanus, diphtheria, pertussis (Tdap, Td) immunization. / A one-time dose of Tdap vaccine. After that, you need a Td booster dose every 10 years.  HPV immunization. / 3 doses over 6 months, if you are 26 and younger.  Measles, mumps, rubella (MMR) immunization. / You need at least 1 dose of MMR if you were born in 1957 or later. You may also need a second dose.  Meningococcal immunization. / 1 dose if you are age 19 to 21 and a first-year college student living in a residence hall, or have one of several medical conditions, you need to get vaccinated against meningococcal disease. You may also need additional booster doses.  Varicella immunization.** / Consult your caregiver.  Hepatitis A immunization.** / Consult your caregiver. 2 doses, 6 to 18 months apart.  Hepatitis B immunization.** / Consult your caregiver. 3 doses usually over 6 months. Ages 40 to 64  Blood pressure check.** / Every 1 to 2 years.  Lipid and cholesterol check.** / Every 5 years beginning at age 20.  Clinical breast exam.** / Every year after age 40.  Mammogram.** / Every year beginning at age 40   and continuing for as long as you are in good health. Consult with your  caregiver.  Pap test.** / Every 3 years starting at age 30 through age 65 or 70 with a history of 3 consecutive normal Pap tests.  HPV screening.** / Every 3 years from ages 30 through ages 65 to 70 with a history of 3 consecutive normal Pap tests.  Fecal occult blood test (FOBT) of stool. / Every year beginning at age 50 and continuing until age 75. You may not need to do this test if you get a colonoscopy every 10 years.  Flexible sigmoidoscopy or colonoscopy.** / Every 5 years for a flexible sigmoidoscopy or every 10 years for a colonoscopy beginning at age 50 and continuing until age 75.  Hepatitis C blood test.** / For all people born from 1945 through 1965 and any individual with known risks for hepatitis C.  Skin self-exam. / Monthly.  Influenza immunization.** / Every year.  Pneumococcal polysaccharide immunization.** / 1 to 2 doses if you smoke cigarettes or if you have certain chronic medical conditions.  Tetanus, diphtheria, pertussis (Tdap, Td) immunization.** / A one-time dose of Tdap vaccine. After that, you need a Td booster dose every 10 years.  Measles, mumps, rubella (MMR) immunization. / You need at least 1 dose of MMR if you were born in 1957 or later. You may also need a second dose.  Varicella immunization.** / Consult your caregiver.  Meningococcal immunization.** / Consult your caregiver.  Hepatitis A immunization.** / Consult your caregiver. 2 doses, 6 to 18 months apart.  Hepatitis B immunization.** / Consult your caregiver. 3 doses, usually over 6 months. Ages 65 and over  Blood pressure check.** / Every 1 to 2 years.  Lipid and cholesterol check.** / Every 5 years beginning at age 20.  Clinical breast exam.** / Every year after age 40.  Mammogram.** / Every year beginning at age 40 and continuing for as long as you are in good health. Consult with your caregiver.  Pap test.** / Every 3 years starting at age 30 through age 65 or 70 with a 3  consecutive normal Pap tests. Testing can be stopped between 65 and 70 with 3 consecutive normal Pap tests and no abnormal Pap or HPV tests in the past 10 years.  HPV screening.** / Every 3 years from ages 30 through ages 65 or 70 with a history of 3 consecutive normal Pap tests. Testing can be stopped between 65 and 70 with 3 consecutive normal Pap tests and no abnormal Pap or HPV tests in the past 10 years.  Fecal occult blood test (FOBT) of stool. / Every year beginning at age 50 and continuing until age 75. You may not need to do this test if you get a colonoscopy every 10 years.  Flexible sigmoidoscopy or colonoscopy.** / Every 5 years for a flexible sigmoidoscopy or every 10 years for a colonoscopy beginning at age 50 and continuing until age 75.  Hepatitis C blood test.** / For all people born from 1945 through 1965 and any individual with known risks for hepatitis C.  Osteoporosis screening.** / A one-time screening for women ages 65 and over and women at risk for fractures or osteoporosis.  Skin self-exam. / Monthly.  Influenza immunization.** / Every year.  Pneumococcal polysaccharide immunization.** / 1 dose at age 65 (or older) if you have never been vaccinated.  Tetanus, diphtheria, pertussis (Tdap, Td) immunization. / A one-time dose of Tdap vaccine if you are over   65 and have contact with an infant, are a healthcare worker, or simply want to be protected from whooping cough. After that, you need a Td booster dose every 10 years.  Varicella immunization.** / Consult your caregiver.  Meningococcal immunization.** / Consult your caregiver.  Hepatitis A immunization.** / Consult your caregiver. 2 doses, 6 to 18 months apart.  Hepatitis B immunization.** / Check with your caregiver. 3 doses, usually over 6 months. ** Family history and personal history of risk and conditions may change your caregiver's recommendations. Document Released: 01/30/2002 Document Revised: 02/26/2012  Document Reviewed: 05/01/2011 [employer] Patient Information 2013 [employer].  

## 2013-01-27 NOTE — Progress Notes (Signed)
  Subjective:     Kristen [name] is a 42 y.o. G21P3003 female and is here for a comprehensive gynecologic physical exam. The patient reports no problems.  History   Social History  . Marital Status: Married    Spouse Name: N/A    Number of Children: 3  . Years of Education: N/A   Occupational History  . teacher's assistant    Social History Main Topics  . Smoking status: Never Smoker   . Smokeless tobacco: Not on file  . Alcohol Use: No  . Drug Use: No  . Sexually Active: Yes -- Female partner(s)    Birth Control/ Protection: IUD   Other Topics Concern  . Not on file   Social History Narrative  . No narrative on file   Health Maintenance  Topic Date Due  . Influenza Vaccine  08/18/1972  . Pap Smear  02/18/1990  . Tetanus/tdap  02/19/1991    The following portions of the patient's history were reviewed and updated as appropriate: allergies, current medications, past family history, past medical history, past social history, past surgical history and problem list.  Review of Systems A comprehensive review of systems was negative.   Objective:   BP 113/77  Pulse 79  Ht 5\' 9"  (1.753 m)  Wt 175 lb (79.379 kg)  BMI 25.83 kg/m2  LMP 01/06/2013 GENERAL: Well-developed, well-nourished female in no acute distress. Multiple red nevi noted on torso and abdomen (followed by a Dermatologist) HEENT: Normocephalic, atraumatic. Sclerae anicteric.  NECK: Supple. Normal thyroid.  LUNGS: Clear to auscultation bilaterally.  HEART: Regular rate and rhythm. BREASTS: Symmetric in size. No masses, skin changes, nipple drainage, or lymphadenopathy. ABDOMEN: Soft, nontender, nondistended. No organomegaly. PELVIC: Normal external female genitalia. Vagina is pink and rugated.  Normal discharge. Normal cervix contour. Pap smear obtained. Uterus is normal in size. No adnexal mass or tenderness.  EXTREMITIES: No cyanosis, clubbing, or edema, 2+ distal pulses.   Assessment:    Healthy female  exam.    Plan:   Pap done, will follow up results and manage accordingly. Mammogram scheduled Routine preventative health maintenance measures emphasized

## 2013-01-28 ENCOUNTER — Encounter: Payer: Self-pay | Admitting: Obstetrics & Gynecology

## 2013-05-01 ENCOUNTER — Ambulatory Visit: Payer: 59 | Admitting: Internal Medicine

## 2013-10-27 LAB — LIPID PANEL
Cholesterol: 182 mg/dL (ref 0–200)
HDL: 74 mg/dL — AB (ref 35–70)
LDL CALC: 98 mg/dL
Triglycerides: 48 mg/dL (ref 40–160)

## 2013-10-27 LAB — HEPATIC FUNCTION PANEL
ALT: 15 U/L (ref 7–35)
AST: 19 U/L (ref 13–35)

## 2013-10-27 LAB — BASIC METABOLIC PANEL
BUN: 8 mg/dL (ref 4–21)
Creatinine: 0.7 mg/dL (ref 0.5–1.1)
Glucose: 83 mg/dL
Potassium: 4.4 mmol/L (ref 3.4–5.3)
Sodium: 142 mmol/L (ref 137–147)

## 2013-10-27 LAB — CBC AND DIFFERENTIAL
HCT: 41 % (ref 36–46)
Hemoglobin: 14 g/dL (ref 12.0–16.0)
Platelets: 330 10*3/uL (ref 150–399)
WBC: 5.2 10^3/mL

## 2014-01-01 ENCOUNTER — Telehealth: Payer: Self-pay | Admitting: Neurology

## 2014-01-01 MED ORDER — PRAMIPEXOLE DIHYDROCHLORIDE ER 0.75 MG PO TB24: 1.0000 | ORAL_TABLET | Freq: Every evening | ORAL | Status: DC

## 2014-01-01 NOTE — Telephone Encounter (Signed)
[facility], [facility]

## 2014-01-01 NOTE — Telephone Encounter (Signed)
Patient's husband calling about Mirapex script refill, patient also needs to be reassigned, former Dr. Sandria [facility] patient.

## 2014-01-01 NOTE — Telephone Encounter (Signed)
Sent one refill via [facility] while patient is pending assignment.

## 2014-01-02 NOTE — Telephone Encounter (Signed)
Assigned to Dr. Frances FurbishAthar with her approval (RLS/ Sleep).  Had not seen Dr. Vickey Hugerohmeier.

## 2014-01-06 ENCOUNTER — Ambulatory Visit: Payer: 59 | Admitting: Neurology

## 2014-01-08 ENCOUNTER — Telehealth: Payer: Self-pay | Admitting: *Deleted

## 2014-01-08 MED ORDER — PRAMIPEXOLE DIHYDROCHLORIDE 0.75 MG PO TABS: 0.7500 mg | ORAL_TABLET | Freq: Every evening | ORAL | Status: DC

## 2014-01-08 NOTE — Telephone Encounter (Signed)
I'm not exactly sure how to answer this. She had an appointment 2 days ago which she missed. She will have to schedule another appointment with me. In the interim, please change Mirapex ER to Mirapex generic 0.75 mg strength if that is the dose she was taking at night. thx Janene Harveysa

## 2014-01-08 NOTE — Telephone Encounter (Signed)
I sent in one refill on regular Mirapex 0.75 with same instructions as the ER Rx.  I called the patient back to advise.  Spoke with Mr Kristen [name].  Explained we changed the patient from ER to Regular Release.  Also informed them they would need to call back and schedule an appt.  He verbalized understanding and says they will call back tomorrow.

## 2014-01-08 NOTE — Telephone Encounter (Signed)
Ins does not cover the Mirapex ER.  I submitted a prior auth request asking for an exception to be granted, and have not yet heard back.  I called the ins.  They indicated the request is currently pending, and was sent to the medical review board for a determination.  They will contact both the patient and us of the outcome.  I called the patient back.  Spoke with Mr Kristen [name].  He asked that I call them back again and ask them to expedite the request.  I called again.  Spoke with Kristen [name].  She said they are still processing the request and it would be 24-48 hours before a determination was made.  They would not allow me to do anything further via phone, but the request was market Urgent.  Mr. Kristen [name] would like to know if something different can be prescribed that may be covered.  He called ins and they will cover Regular Pramipexole or Regular Ropinorole.  He says the cost of Mirapex ER would be $1600, and they cannot afford that.   Former [facility] patient assigned to Dr Kristen [name].  Please advise.  I will be happy call call in a new Rx if change is approved.  Thank you.

## 2014-02-03 ENCOUNTER — Encounter: Payer: Self-pay | Admitting: Obstetrics & Gynecology

## 2014-02-13 ENCOUNTER — Other Ambulatory Visit: Payer: Self-pay | Admitting: Neurology

## 2014-07-06 ENCOUNTER — Ambulatory Visit: Payer: 59 | Admitting: Neurology

## 2014-07-07 ENCOUNTER — Ambulatory Visit: Payer: Self-pay | Admitting: Family Medicine

## 2014-07-09 ENCOUNTER — Ambulatory Visit (INDEPENDENT_AMBULATORY_CARE_PROVIDER_SITE_OTHER): Payer: BC Managed Care – PPO | Admitting: Neurology

## 2014-07-09 ENCOUNTER — Encounter: Payer: Self-pay | Admitting: Neurology

## 2014-07-09 VITALS — BP 116/80 | HR 64 | Temp 98.0°F | Ht 69.0 in | Wt 189.0 lb

## 2014-07-09 DIAGNOSIS — G4761 Periodic limb movement disorder: Secondary | ICD-10-CM

## 2014-07-09 DIAGNOSIS — G2581 Restless legs syndrome: Secondary | ICD-10-CM

## 2014-07-09 MED ORDER — ROTIGOTINE 1 MG/24HR TD PT24: 1.0000 mg | MEDICATED_PATCH | Freq: Every day | TRANSDERMAL | Status: DC

## 2014-07-09 NOTE — Patient Instructions (Signed)
We will try neupro patch 1 mg (and possibly 2 mg in the near future) for your RLS instead of mirapex. If it does not work out, we may change back to mirapex and will try 1 mg.

## 2014-07-09 NOTE — Progress Notes (Signed)
Subjective:    Patient ID: Kristen [name] is a 43 y.o. female.  HPI    Interim history:   Ms. Kristen [name] is a 43 year old right-handed woman with an underlying medical history of polycystic ovary syndrome, obesity, status post gastric bypass surgery in October 2002, bipolar disorder, obstructive sleep apnea, kidney stones, deep vein thrombophlebitis in 2006 and history of pulmonary embolism in November 2010, on Coumadin, who presents for followup consultation of her RLS of over 10 years duration. She is unaccompanied today. This is her first visit with me and she previously followed with Dr. Fayrene Fearing love and was last seen by him on 01/23/2013, at which time he felt that she had worsening RLS symptoms with the addition of a 2 to. He started her on Mirapex ER and obtained blood work including methylmalonic acid which was normal, vitamin B12 which was low at 169, ferritin which was low at 7. She was called and advised to start taking iron supplements and to start taking B12 injections.  Today, she reports that the immediate release Mirapex is not as effective and symptoms are not as well controlled. She denies any augmentation. Symptoms are particularly worse when she is in a confined space or in the car. Symptoms are particularly worse at night. Her symptoms affect her lower extremities symmetrically from above the knees on down. She has no actual symptoms in her upper body or trunk. She has had no recent medication changes. She has been on Zoloft for years. She sees a psychiatrist for her bipolar. She is on [name].   I reviewed Dr. Imagene Gurney prior notes and the patient's records and below is a summary of that review: She was initially seen by Dr. Sandria Manly in March 2004 for involuntary movements of her arms, legs, shoulders, and neck occurring at night while asleep. This was thought to be periodic leg movements in sleep and she endorsed symptoms of RLS occurring later in the day. She had a sleep study in 2010  which showed moderate sleep apnea, severe snoring, mild desaturations, and moderate to severe periodic leg movements of sleep. She was started on CPAP at 10 cm. There was no family history of RLS. Her ferritin level at the time was 118. Her bipolar was treated with Abilify in the past and Latuda, and she has a history of hyperlipidemia as well as right knee arthroscopic surgery complicated by phlebitis, gastric bypass surgery which was complicated by pulmonary embolism despite a Greenfield filter in December 2000 and, kidney stones with multiple operations as well as polycystic ovary syndrome. Her BX has been helpful. She denied impulse control disorder, postural dizziness  or daytime somnolence with Mirapex.   Her Past Medical History Is Significant For: Past Medical History  Diagnosis Date  . Kidney stones   . Pulmonary embolism   . RLS (restless legs syndrome)   . Bipolar disorder   . DVT (deep venous thrombosis)   . Anxiety   . GERD (gastroesophageal reflux disease)   . Cholelithiasis     Her Past Surgical History Is Significant For: Past Surgical History  Procedure Laterality Date  . Gastric bypass    . Tonsillectomy    . Kidney stone removal    . Knee arthroscopy    . Cholecystectomy    . Hernia repair      Her Family History Is Significant For: Family History  Problem Relation Age of Onset  . Diabetes Father   . Stroke Father   . Heart attack Father   .  Kidney failure Father   . Hypertension Father   . Colon cancer Paternal Grandfather   . Hypertension Paternal Grandfather   . Breast cancer Paternal Grandmother   . Diabetes Maternal Grandfather   . Stroke Maternal Grandfather   . Uterine cancer Maternal Grandmother   . Ulcers Maternal Grandmother     Her Social History Is Significant For: History   Social History  . Marital Status: Married    Spouse Name: N/A    Number of Children: 3  . Years of Education: N/A   Occupational History  . teacher's assistant     Social History Main Topics  . Smoking status: Never Smoker   . Smokeless tobacco: None  . Alcohol Use: No  . Drug Use: No  . Sexual Activity: Yes    Partners: Male    Birth Control/ Protection: IUD   Other Topics Concern  . None   Social History Narrative  . None    Her Allergies Are:  Allergies  Allergen Reactions  . Codeine Nausea Only  :   Her Current Medications Are:  Outpatient Encounter Prescriptions as of 07/09/2014  Medication Sig  . lamoTRIgine (LAMICTAL) 200 MG tablet Take 200 mg by mouth daily.  . pramipexole (MIRAPEX) 0.75 MG tablet TAKE 1 TABLET (0.75 MG TOTAL) BY MOUTH EVERY EVENING.  Marland Kitchen. sertraline (ZOLOFT) 50 MG tablet Take 1 tablet by mouth daily.  Marland Kitchen. warfarin (COUMADIN) 5 MG tablet Take 10-15 mg by mouth daily. Take three tablets (15 mg total) on Monday, Wednesday, Friday; then take two tablets (10 mg total) on Sunday, Tuesday, Thursday Saturday  . pantoprazole (PROTONIX) 40 MG tablet Take 1 tablet (40 mg total) by mouth daily.  . [DISCONTINUED] clonazePAM (KLONOPIN) 0.5 MG tablet Take 0.5 mg by mouth 2 (two) times daily as needed. Anxiety  . [DISCONTINUED] Lurasidone HCl (LATUDA) 60 MG TABS Take by mouth.  . [DISCONTINUED] sucralfate (CARAFATE) 1 G tablet Take 1 tablet (1 g total) by mouth 4 (four) times daily. One tablet 1 hr before meals and at bedtime  :  Review of Systems:  Out of a complete 14 point review of systems, all are reviewed and negative with the exception of these symptoms as listed below:  Review of Systems  Constitutional: Negative.   HENT: Negative.   Eyes: Negative.   Respiratory: Negative.   Cardiovascular: Negative.   Gastrointestinal: Negative.   Endocrine: Negative.   Genitourinary: Negative.   Musculoskeletal: Negative.   Skin: Negative.   Allergic/Immunologic: Negative.   Neurological:       Restless leg syndrome  Hematological: Negative.   Psychiatric/Behavioral: Positive for sleep disturbance (RLS).    Objective:   Neurologic Exam  Physical Exam Physical Examination:   Filed Vitals:   07/09/14 1129  BP: 116/80  Pulse: 64  Temp: 98 F (36.7 C)    General Examination: The patient is a very pleasant 43 y.o. female in no acute distress. She appears well-developed and well-nourished and well groomed.   HEENT: Normocephalic, atraumatic, pupils are equal, round and reactive to light and accommodation. Funduscopic exam is normal with sharp disc margins noted. Extraocular tracking is good without limitation to gaze excursion or nystagmus noted. Normal smooth pursuit is noted. Hearing is grossly intact. Tympanic membranes are clear bilaterally. Face is symmetric with normal facial animation and normal facial sensation. Speech is clear with no dysarthria noted. There is no hypophonia. There is no lip, neck/head, jaw or voice tremor. Neck is supple with full range of passive  and active motion. There are no carotid bruits on auscultation. Oropharynx exam reveals: mild mouth dryness, good dental hygiene and mild airway crowding, due to larger tongue. Mallampati is class I. Tongue protrudes centrally and palate elevates symmetrically.  Chest: Clear to auscultation without wheezing, rhonchi or crackles noted.  Heart: S1+S2+0, regular and normal without murmurs, rubs or gallops noted.   Abdomen: Soft, non-tender and non-distended with normal bowel sounds appreciated on auscultation.  Extremities: There is no pitting edema in the distal lower extremities bilaterally. Pedal pulses are intact.  Skin: Warm and dry without trophic changes noted. There are no varicose veins.  Musculoskeletal: exam reveals no obvious joint deformities, tenderness or joint swelling or erythema.   Neurologically:  Mental status: The patient is awake, alert and oriented in all 4 spheres. Her immediate and remote memory, attention, language skills and fund of knowledge are appropriate. There is no evidence of aphasia, agnosia, apraxia or  anomia. Speech is clear with normal prosody and enunciation. Thought process is linear. Mood is normal and affect is normal.  Cranial nerves II - XII are as described above under HEENT exam. In addition: shoulder shrug is normal with equal shoulder height noted. Motor exam: Normal bulk, strength and tone is noted. There is no drift, tremor or rebound. Romberg is negative. Reflexes are 2+ throughout. Babinski: Toes are flexor bilaterally. Fine motor skills and coordination: intact with normal finger taps, normal hand movements, normal rapid alternating patting, normal foot taps and normal foot agility.  Cerebellar testing: No dysmetria or intention tremor on finger to nose testing. Heel to shin is unremarkable bilaterally. There is no truncal or gait ataxia.  Sensory exam: intact to light touch, pinprick, vibration, temperature sense in the upper and lower extremities.  Gait, station and balance: She stands easily. No veering to one side is noted. No leaning to one side is noted. Posture is age-appropriate and stance is narrow based. Gait shows normal stride length and normal pace. No problems turning are noted. She turns en bloc. Tandem walk is unremarkable. Intact toe and heel stance is noted.               Assessment and Plan:   In summary, Jimena NYLENE INLOW is a very pleasant 43 y.o.-year old female with an underlying medical history of polycystic ovary syndrome, obesity, status post gastric bypass surgery in October 2002, bipolar disorder, obstructive sleep apnea, kidney stones, deep vein thrombophlebitis in 2006 and history of pulmonary embolism in November 2010, on Coumadin, who presents for followup consultation of her RLS of over 10 years duration.  She was reasonably well controlled on Mirapex long-acting but since her insurance denied her Mirapex ER she has been on generic immediate release Mirapex with suboptimal results. Today I suggested that we change her to neupro for tria, starting at 1 mg  strength once every 24 hours. We can increase it to 2 mg in the near future. I provided her with a free one month trial card and a co-pay card as well. If her insurance gives [place] pushback with the Neupro which I do hope she can try it, we can certainly go back to Mirapex generic and go up slightly on the dose to a total of 1 mg. I did talk to her about the concern with augmentation on higher doses of dopamine agonists. I talked her about impulse control disorder, sleepiness and other side effects with dopamine agonists. She has had no issues in that regard. Her physical exam is  nonfocal and I reassured her in that regard. I would like to see her back in 6 months from now, sooner if the need arises. She was in agreement.

## 2014-07-14 ENCOUNTER — Telehealth: Payer: Self-pay | Admitting: Neurology

## 2014-07-14 NOTE — Telephone Encounter (Signed)
Patient stated prior authorization was needed for Rotigotine (NEUPRO) 1 MG/24HR PT24 per Pharmacy.  Please call and advise.

## 2014-07-14 NOTE — Telephone Encounter (Signed)
All clinical info was already sent to ins on 07/24.  The request is currently under review by ins.  I called the patient back, got no answer.  Left message.

## 2014-07-16 ENCOUNTER — Telehealth: Payer: Self-pay

## 2014-07-16 NOTE — Telephone Encounter (Signed)
[employer] [place] notified us they have approved our request for coverage on Neupro effective until 12/17/2038 or until the policy changes or is terminated Ref # [id number]

## 2014-07-22 ENCOUNTER — Telehealth: Payer: Self-pay | Admitting: Neurology

## 2014-07-22 DIAGNOSIS — G4761 Periodic limb movement disorder: Secondary | ICD-10-CM

## 2014-07-22 DIAGNOSIS — G2581 Restless legs syndrome: Secondary | ICD-10-CM

## 2014-07-22 MED ORDER — ROTIGOTINE 2 MG/24HR TD PT24: 1.0000 | MEDICATED_PATCH | Freq: Every day | TRANSDERMAL | Status: DC

## 2014-07-22 NOTE — Telephone Encounter (Signed)
Informed patient per Dr Teofilo PodAthar's message below,she verbalized understanding

## 2014-07-22 NOTE — Telephone Encounter (Signed)
Changed Rx to 2 mg Neupro. pls advise pt that Rx is sent to pharm.

## 2014-10-19 ENCOUNTER — Encounter: Payer: Self-pay | Admitting: Neurology

## 2014-11-18 ENCOUNTER — Telehealth: Payer: Self-pay | Admitting: Neurology

## 2014-11-18 NOTE — Telephone Encounter (Signed)
Rescheduled and confirmed appointment with patient

## 2014-11-18 NOTE — Telephone Encounter (Signed)
Patient to call back to confirm appointment for new time on 01/11/15

## 2015-01-11 ENCOUNTER — Ambulatory Visit (INDEPENDENT_AMBULATORY_CARE_PROVIDER_SITE_OTHER): Payer: BLUE CROSS/BLUE SHIELD | Admitting: Neurology

## 2015-01-11 ENCOUNTER — Encounter: Payer: Self-pay | Admitting: Neurology

## 2015-01-11 VITALS — BP 104/69 | HR 71 | Temp 98.4°F | Ht 69.0 in | Wt 141.0 lb

## 2015-01-11 DIAGNOSIS — G2581 Restless legs syndrome: Secondary | ICD-10-CM

## 2015-01-11 DIAGNOSIS — G4761 Periodic limb movement disorder: Secondary | ICD-10-CM

## 2015-01-11 MED ORDER — ROTIGOTINE 2 MG/24HR TD PT24: 1.0000 | MEDICATED_PATCH | Freq: Every day | TRANSDERMAL | Status: DC

## 2015-01-11 NOTE — Patient Instructions (Signed)
Since you are doing well, we can continue with Neupro patch 2 mg once daily and I can see you back in a year!

## 2015-01-11 NOTE — Progress Notes (Signed)
Subjective:    Patient ID: Kristen [name] is a 44 y.o. female.  HPI     Interim history:   Kristen [name] is a 44 year old right-handed woman with an underlying medical history of polycystic ovary syndrome, obesity (status post gastric bypass surgery in October 2002), bipolar disorder, obstructive sleep apnea, kidney stones, deep vein thrombophlebitis in 2006 and history of pulmonary embolism in November 2010 (on Coumadin), who presents for followup consultation of her RLS of over 10 years duration. She is unaccompanied today. I first met her on 07/09/14, at which time she reported that the immediate release Mirapex was not as effective. She denied any augmentation. She did have flareup in her symptoms when confined in a car. Symptoms were worse at night. She had no upper body symptoms. She had been on Zoloft for years. She was seen a psychiatrist for her bipolar disorder and was on [place]. Her insurance had denied long-acting Mirapex. I suggested a trial of Neupro patch. I started her on 1 mg strength and then increase it in August 2015 to 2 milligrams once daily.  Today, she reports doing well. She has been tolerating the 2 mg dose of Neupro and it is helpful. She has no new complaints. She had an increase in her Latuda and a decrease in her Zoloft dose per her psychiatrist. She denies any side effects with Neupro in particular, she has had no significant sleepiness, no swelling, no nausea and no psychiatric side effects.  She previously followed with Dr. Morene Antu and was last seen by him on 01/23/2013, at which time he felt that she had worsening RLS symptoms and started her on Mirapex ER and obtained blood work including methylmalonic acid which was normal, vitamin B12 which was low at 169, ferritin which was low at 7. She was called and advised to start taking iron supplements and to start taking B12 injections.  She was initially seen by Dr. Erling Cruz in March 2004 for involuntary movements of her  arms, legs, shoulders, and neck occurring at night while asleep. This was thought to be periodic leg movements in sleep and she endorsed symptoms of RLS occurring later in the day. She had a sleep study in 2010 which showed moderate sleep apnea, severe snoring, mild desaturations, and moderate to severe periodic leg movements of sleep. She was started on CPAP at 10 cm. There was no family history of RLS. Her ferritin level at the time was 118. Her bipolar was treated with Abilify in the past and Latuda, and she has a history of hyperlipidemia as well as right knee arthroscopic surgery complicated by phlebitis, gastric bypass surgery which was complicated by pulmonary embolism despite a Greenfield filter in December 2000 and, kidney stones with multiple operations as well as polycystic ovary syndrome. Her BX has been helpful. She denied impulse control disorder, postural dizziness or daytime somnolence with Mirapex.       Her Past Medical History Is Significant For: Past Medical History  Diagnosis Date  . Kidney stones   . Pulmonary embolism   . RLS (restless legs syndrome)   . Bipolar disorder   . DVT (deep venous thrombosis)   . Anxiety   . GERD (gastroesophageal reflux disease)   . Cholelithiasis     Her Past Surgical History Is Significant For: Past Surgical History  Procedure Laterality Date  . Gastric bypass    . Tonsillectomy    . Kidney stone removal    . Knee arthroscopy    .  Cholecystectomy    . Hernia repair      Her Family History Is Significant For: Family History  Problem Relation Age of Onset  . Diabetes Father   . Stroke Father   . Heart attack Father   . Kidney failure Father   . Hypertension Father   . Colon cancer Paternal Grandfather   . Hypertension Paternal Grandfather   . Breast cancer Paternal Grandmother   . Diabetes Maternal Grandfather   . Stroke Maternal Grandfather   . Uterine cancer Maternal Grandmother   . Ulcers Maternal Grandmother      Her Social History Is Significant For: History   Social History  . Marital Status: Married    Spouse Name: N/A    Number of Children: 3  . Years of Education: N/A   Occupational History  . teacher's assistant    Social History Main Topics  . Smoking status: Never Smoker   . Smokeless tobacco: Never Used  . Alcohol Use: No  . Drug Use: No  . Sexual Activity:    Partners: Male    Birth Control/ Protection: IUD   Other Topics Concern  . None   Social History Narrative    Her Allergies Are:  Allergies  Allergen Reactions  . Codeine Nausea Only  :   Her Current Medications Are:  Outpatient Encounter Prescriptions as of 01/11/2015  Medication Sig  . ALPRAZolam (XANAX) 0.5 MG tablet As needed  . lamoTRIgine (LAMICTAL) 200 MG tablet Take 200 mg by mouth daily.  . Lurasidone HCl (LATUDA PO) Take 100 mg by mouth. Once daily, 56m and a 264m . rotigotine (NEUPRO) 2 MG/24HR Place 1 patch onto the skin daily.  . sertraline (ZOLOFT) 25 MG tablet Take 25 mg by mouth daily.  . Marland Kitchenarfarin (COUMADIN) 5 MG tablet Take 10-15 mg by mouth daily. Take three tablets (15 mg total) on Monday, Wednesday, Friday; then take two tablets (10 mg total) on Sunday, Tuesday, Thursday Saturday  . [DISCONTINUED] pantoprazole (PROTONIX) 40 MG tablet Take 1 tablet (40 mg total) by mouth daily.  . [DISCONTINUED] pramipexole (MIRAPEX) 0.75 MG tablet TAKE 1 TABLET (0.75 MG TOTAL) BY MOUTH EVERY EVENING. (Patient not taking: Reported on 01/11/2015)  . [DISCONTINUED] sertraline (ZOLOFT) 50 MG tablet Take 1 tablet by mouth daily.  :  Review of Systems:  Out of a complete 14 point review of systems, all are reviewed and negative with the exception of these symptoms as listed below:    Review of Systems  Neurological:       Restless legs    Objective:  Neurologic Exam  Physical Exam Physical Examination:   Filed Vitals:   01/11/15 1332  BP: 104/69  Pulse: 71  Temp: 98.4 F (36.9 C)     General Examination: The patient is a very pleasant [age].o. female in no acute distress. She appears well-developed and well-nourished and well groomed.   HEENT: Normocephalic, atraumatic, pupils are equal, round and reactive to light and accommodation. Funduscopic exam is normal with sharp disc margins noted. Extraocular tracking is good without limitation to gaze excursion or nystagmus noted. Normal smooth pursuit is noted. Hearing is grossly intact. Face is symmetric with normal facial animation and normal facial sensation. Speech is clear with no dysarthria noted. There is no hypophonia. There is no lip, neck/head, jaw or voice tremor. Neck is supple with full range of passive and active motion. There are no carotid bruits on auscultation. Oropharynx exam reveals: mild mouth dryness, good dental  hygiene and mild airway crowding, due to larger tongue. Mallampati is class I. Tongue protrudes centrally and palate elevates symmetrically.   Chest: Clear to auscultation without wheezing, rhonchi or crackles noted.  Heart: S1+S2+0, regular and normal without murmurs, rubs or gallops noted.   Abdomen: Soft, non-tender and non-distended with normal bowel sounds appreciated on auscultation.  Extremities: There is no pitting edema in the distal lower extremities bilaterally. Pedal pulses are intact.  Skin: Warm and dry without trophic changes noted. There are no varicose veins.  Musculoskeletal: exam reveals no obvious joint deformities, tenderness or joint swelling or erythema.   Neurologically:  Mental status: The patient is awake, alert and oriented in all 4 spheres. Her immediate and remote memory, attention, language skills and fund of knowledge are appropriate. There is no evidence of aphasia, agnosia, apraxia or anomia. Speech is clear with normal prosody and enunciation. Thought process is linear. Mood is normal and affect is normal.  Cranial nerves II - XII are as described above under  HEENT exam. In addition: shoulder shrug is normal with equal shoulder height noted. Motor exam: Normal bulk, strength and tone is noted. There is no drift, tremor or rebound. Romberg is negative. Reflexes are 2+ throughout. Fine motor skills and coordination: intact with normal finger taps, normal hand movements, normal rapid alternating patting, normal foot taps and normal foot agility.  Cerebellar testing: No dysmetria or intention tremor on finger to nose testing. Heel to shin is unremarkable bilaterally. There is no truncal or gait ataxia.  Sensory exam: intact to light touch, pinprick, vibration, temperature sense in the upper and lower extremities.  Gait, station and balance: She stands easily. No veering to one side is noted. No leaning to one side is noted. Posture is age-appropriate and stance is narrow based. Gait shows normal stride length and normal pace. No problems turning are noted. She turns en bloc. Tandem walk is unremarkable.               Assessment and Plan:   In summary, Kalijah JAIONA SIMIEN is a very pleasant 44 year old female with an underlying medical history of polycystic ovary syndrome, obesity, status post gastric bypass surgery in October 2002, bipolar disorder, obstructive sleep apnea, kidney stones, deep vein thrombophlebitis in 2006 and history of pulmonary embolism in November 2010, on Coumadin, who presents for followup consultation of her RLS of over 10 years duration. She was reasonably well controlled on Mirapex long-acting in the past, but her insurance denied her Mirapex ER and she had suboptimal results on the generic Mirapex. She has been on Neupro 2 mg with good results and no side effects reported. Her physical exam is nonfocal and I reassured her in that regard. Since she is doing well I can see her back in one year. She was in agreement.

## 2015-05-13 LAB — POCT INR: INR: 3.9 — AB (ref <=1.1)

## 2015-05-13 LAB — PROTIME-INR: Protime: 46.2 seconds — AB (ref 10.0–13.8)

## 2015-05-20 DIAGNOSIS — J302 Other seasonal allergic rhinitis: Secondary | ICD-10-CM | POA: Insufficient documentation

## 2015-05-20 DIAGNOSIS — Z86711 Personal history of pulmonary embolism: Secondary | ICD-10-CM | POA: Insufficient documentation

## 2015-05-20 DIAGNOSIS — Z9229 Personal history of other drug therapy: Secondary | ICD-10-CM | POA: Insufficient documentation

## 2015-05-20 DIAGNOSIS — G2581 Restless legs syndrome: Secondary | ICD-10-CM | POA: Insufficient documentation

## 2015-05-20 DIAGNOSIS — E611 Iron deficiency: Secondary | ICD-10-CM | POA: Insufficient documentation

## 2015-05-20 DIAGNOSIS — K760 Fatty (change of) liver, not elsewhere classified: Secondary | ICD-10-CM | POA: Insufficient documentation

## 2015-05-20 DIAGNOSIS — I80209 Phlebitis and thrombophlebitis of unspecified deep vessels of unspecified lower extremity: Secondary | ICD-10-CM | POA: Insufficient documentation

## 2015-05-20 DIAGNOSIS — N879 Dysplasia of cervix uteri, unspecified: Secondary | ICD-10-CM | POA: Insufficient documentation

## 2015-05-20 DIAGNOSIS — E538 Deficiency of other specified B group vitamins: Secondary | ICD-10-CM | POA: Insufficient documentation

## 2015-05-20 DIAGNOSIS — R87619 Unspecified abnormal cytological findings in specimens from cervix uteri: Secondary | ICD-10-CM | POA: Insufficient documentation

## 2015-05-20 DIAGNOSIS — Z9884 Bariatric surgery status: Secondary | ICD-10-CM | POA: Insufficient documentation

## 2015-05-20 DIAGNOSIS — T819XXA Unspecified complication of procedure, initial encounter: Secondary | ICD-10-CM | POA: Insufficient documentation

## 2015-05-22 ENCOUNTER — Encounter (HOSPITAL_COMMUNITY): Payer: Self-pay | Admitting: *Deleted

## 2015-05-22 ENCOUNTER — Emergency Department (HOSPITAL_COMMUNITY)
Admission: EM | Admit: 2015-05-22 | Discharge: 2015-05-22 | Disposition: A | Discharge status: Home / Self Care | Payer: BLUE CROSS/BLUE SHIELD | Attending: Emergency Medicine | Admitting: Emergency Medicine

## 2015-05-22 DIAGNOSIS — Z3202 Encounter for pregnancy test, result negative: Secondary | ICD-10-CM | POA: Insufficient documentation

## 2015-05-22 DIAGNOSIS — F419 Anxiety disorder, unspecified: Secondary | ICD-10-CM | POA: Insufficient documentation

## 2015-05-22 DIAGNOSIS — Z87442 Personal history of urinary calculi: Secondary | ICD-10-CM | POA: Diagnosis not present

## 2015-05-22 DIAGNOSIS — Z86718 Personal history of other venous thrombosis and embolism: Secondary | ICD-10-CM | POA: Diagnosis not present

## 2015-05-22 DIAGNOSIS — F319 Bipolar disorder, unspecified: Secondary | ICD-10-CM | POA: Diagnosis not present

## 2015-05-22 DIAGNOSIS — Z8719 Personal history of other diseases of the digestive system: Secondary | ICD-10-CM | POA: Diagnosis not present

## 2015-05-22 DIAGNOSIS — R319 Hematuria, unspecified: Secondary | ICD-10-CM

## 2015-05-22 DIAGNOSIS — Z79899 Other long term (current) drug therapy: Secondary | ICD-10-CM | POA: Diagnosis not present

## 2015-05-22 DIAGNOSIS — Z86711 Personal history of pulmonary embolism: Secondary | ICD-10-CM | POA: Diagnosis not present

## 2015-05-22 DIAGNOSIS — R109 Unspecified abdominal pain: Secondary | ICD-10-CM | POA: Diagnosis present

## 2015-05-22 DIAGNOSIS — Z7901 Long term (current) use of anticoagulants: Secondary | ICD-10-CM | POA: Diagnosis not present

## 2015-05-22 DIAGNOSIS — Z8669 Personal history of other diseases of the nervous system and sense organs: Secondary | ICD-10-CM | POA: Insufficient documentation

## 2015-05-22 LAB — CBC WITH DIFFERENTIAL/PLATELET
Basophils Absolute: 0 10*3/uL (ref 0.0–0.1)
Basophils Relative: 1 % (ref 0–1)
EOS PCT: 4 % (ref 0–5)
Eosinophils Absolute: 0.3 10*3/uL (ref 0.0–0.7)
HEMATOCRIT: 34.7 % — AB (ref 36.0–46.0)
HEMOGLOBIN: 10.7 g/dL — AB (ref 12.0–15.0)
Lymphocytes Relative: 25 % (ref 12–46)
Lymphs Abs: 1.6 10*3/uL (ref 0.7–4.0)
MCH: 26 pg (ref 26.0–34.0)
MCHC: 30.8 g/dL (ref 30.0–36.0)
MCV: 84.2 fL (ref 78.0–100.0)
MONO ABS: 0.4 10*3/uL (ref 0.1–1.0)
Monocytes Relative: 7 % (ref 3–12)
NEUTROS ABS: 4.2 10*3/uL (ref 1.7–7.7)
Neutrophils Relative %: 63 % (ref 43–77)
PLATELETS: 565 10*3/uL — AB (ref 150–400)
RBC: 4.12 MIL/uL (ref 3.87–5.11)
RDW: 17.3 % — ABNORMAL HIGH (ref 11.5–15.5)
WBC: 6.5 10*3/uL (ref 4.0–10.5)

## 2015-05-22 LAB — URINE MICROSCOPIC-ADD ON

## 2015-05-22 LAB — URINALYSIS, ROUTINE W REFLEX MICROSCOPIC
Bilirubin Urine: NEGATIVE
Glucose, UA: NEGATIVE mg/dL
Ketones, ur: NEGATIVE mg/dL
Leukocytes, UA: NEGATIVE
NITRITE: NEGATIVE
PH: 6.5 (ref 5.0–8.0)
Protein, ur: NEGATIVE mg/dL
Specific Gravity, Urine: 1.013 (ref 1.005–1.030)
UROBILINOGEN UA: 1 mg/dL (ref 0.0–1.0)

## 2015-05-22 LAB — BASIC METABOLIC PANEL
Anion gap: 5 (ref 5–15)
BUN: 9 mg/dL (ref 6–20)
CALCIUM: 8.3 mg/dL — AB (ref 8.9–10.3)
CO2: 25 mmol/L (ref 22–32)
Chloride: 108 mmol/L (ref 101–111)
Creatinine, Ser: 0.65 mg/dL (ref 0.44–1.00)
GFR calc Af Amer: >60 mL/min (ref >60)
GFR calc non Af Amer: >60 mL/min (ref >60)
Glucose, Bld: 92 mg/dL (ref 65–99)
Potassium: 3.9 mmol/L (ref 3.5–5.1)
Sodium: 138 mmol/L (ref 135–145)

## 2015-05-22 LAB — POC URINE PREG, ED: PREG TEST UR: NEGATIVE

## 2015-05-22 MED ORDER — HYDROCODONE-ACETAMINOPHEN 5-325 MG PO TABS: 1.0000 | ORAL_TABLET | ORAL | Status: DC | PRN

## 2015-05-22 MED ORDER — KETOROLAC TROMETHAMINE 15 MG/ML IJ SOLN
15.0000 mg | Freq: Once | INTRAMUSCULAR | Status: AC
  Administered 2015-05-22: 15 mg via INTRAVENOUS
  Filled 2015-05-22: qty 1

## 2015-05-22 MED ORDER — HYDROMORPHONE HCL 1 MG/ML IJ SOLN
1.0000 mg | Freq: Once | INTRAMUSCULAR | Status: AC
  Administered 2015-05-22: 1 mg via INTRAVENOUS
  Filled 2015-05-22: qty 1

## 2015-05-22 MED ORDER — SODIUM CHLORIDE 0.9 % IV BOLUS (SEPSIS)
500.0000 mL | Freq: Once | INTRAVENOUS | Status: AC
  Administered 2015-05-22: 500 mL via INTRAVENOUS

## 2015-05-22 NOTE — ED Notes (Signed)
Awake. Verbally responsive. A/O x4. Resp even and unlabored. No audible adventitious breath sounds noted. ABC's intact.  

## 2015-05-22 NOTE — ED Notes (Signed)
Awake. Verbally responsive. Resp even and unlabored. No audible adventitious breath sounds noted. ABC's intact. Pt reported having lt flank pain 3/10. Family at bedside. IV patent and intact infusing NS at 97499ml/hr without difficulty.

## 2015-05-22 NOTE — ED Notes (Signed)
Pt c/o L flank pain x1 week. Denies dysuria, blood in urine. Hx of kidney stones, sts pain feels similar. No treatment at home. Sees [facility], has not called with this episode.

## 2015-05-22 NOTE — Discharge Instructions (Signed)
If you were given medicines take as directed.  If you are on coumadin or contraceptives realize their levels and effectiveness is altered by many different medicines.  If you have any reaction (rash, tongues swelling, other) to the medicines stop taking and see a physician.    If your blood pressure was elevated in the ER make sure you follow up for management with a primary doctor or return for chest pain, shortness of breath or stroke symptoms.  Please follow up as directed and return to the ER or see a physician for new or worsening symptoms.  Thank you. Filed Vitals:   05/22/15 1417 05/22/15 1438  BP: 121/77   Pulse: 74   Temp: 98.4 F (36.9 C)   TempSrc: Oral   Resp: 17   SpO2: 99% 100%

## 2015-05-22 NOTE — ED Provider Notes (Signed)
CSN: [id number]     Arrival date & time 05/22/15  1300 History   First MD Initiated Contact with Patient 05/22/15 1436     Chief Complaint  Patient presents with  . Flank Pain     (Consider location/radiation/quality/duration/timing/severity/associated sxs/prior Treatment) HPI Comments: 44 year old female with complicated medical history for age including blood clots, gastric bypass, on chronic anticoagulation, multiple kidney stones presents with left flank pain acute onset since Tuesday. This is exactly the same as her previous kidney stones for which she has had multiple and required multiple interventions by urology.. She has [facility] to follow-up with outpatient. No vomiting or fevers. No urinary symptoms. Nothing has improved the pain.  Patient is a 44 y.o. female presenting with flank pain. The history is provided by the patient.  Flank Pain Pertinent negatives include no chest pain, no abdominal pain, no headaches and no shortness of breath.    Past Medical History  Diagnosis Date  . Kidney stones   . Pulmonary embolism   . RLS (restless legs syndrome)   . Bipolar disorder   . DVT (deep venous thrombosis)   . Anxiety   . GERD (gastroesophageal reflux disease)   . Cholelithiasis    Past Surgical History  Procedure Laterality Date  . Gastric bypass    . Tonsillectomy    . Kidney stone removal    . Knee arthroscopy    . Cholecystectomy    . Hernia repair    . Cervical biopsy  w/ loop electrode excision  2007   Family History  Problem Relation Age of Onset  . Diabetes Father   . Stroke Father   . Heart attack Father   . Kidney failure Father   . Hypertension Father   . Colon cancer Paternal Grandfather   . Hypertension Paternal Grandfather   . Breast cancer Paternal Grandmother   . Diabetes Maternal Grandfather   . Stroke Maternal Grandfather   . Uterine cancer Maternal Grandmother   . Ulcers Maternal Grandmother    History  Substance Use Topics  .  Smoking status: Never Smoker   . Smokeless tobacco: Never Used  . Alcohol Use: No   OB History    Gravida Para Term Preterm AB TAB SAB Ectopic Multiple Living   Review of Systems  Constitutional: Negative for fever and chills.  HENT: Negative for congestion.   Eyes: Negative for visual disturbance.  Respiratory: Negative for shortness of breath.   Cardiovascular: Negative for chest pain.  Gastrointestinal: Negative for vomiting and abdominal pain.  Genitourinary: Positive for flank pain. Negative for dysuria.  Musculoskeletal: Negative for back pain, neck pain and neck stiffness.  Skin: Negative for rash.  Neurological: Negative for light-headedness and headaches.      Allergies  Codeine  Home Medications   Prior to Admission medications   Medication Sig Start Date End Date Taking? Authorizing Provider  ALPRAZolam Prudy Feeler) 0.5 MG tablet Take 0.5 mg by mouth See admin instructions. 0.5 mg  Three times daily and 1 mg at bedtime 10/21/14  Yes Historical Provider, MD  lamoTRIgine (LAMICTAL) 200 MG tablet Take 200 mg by mouth daily.   Yes Historical Provider, MD  levonorgestrel (MIRENA) 20 MCG/24HR IUD by Intrauterine route. 10/08/07  Yes Historical Provider, MD  Lurasidone HCl 120 MG TABS Take 120 mg by mouth at bedtime.   Yes Historical Provider, MD  rotigotine (NEUPRO) 2 MG/24HR Place 1 patch onto  the skin daily. 01/11/15  Yes Huston FoleySaima Athar, MD  sertraline (ZOLOFT) 25 MG tablet Take 50 mg by mouth daily.    Yes Historical Provider, MD  Vitamin K, Phytonadione, 100 MCG TABS Take 100 mcg by mouth daily. Take with warfarin 04/28/15  Yes Historical Provider, MD  warfarin (COUMADIN) 5 MG tablet Take 12.5-15 mg by mouth daily. Monday and Thursday 15 mg and take 12.5 on other days   Yes Historical Provider, MD  HYDROcodone-acetaminophen (NORCO) 5-325 MG per tablet Take 1-2 tablets by mouth every 4 (four) hours as needed. 05/22/15   Blane OharaJoshua Nayzeth Altman, MD   BP 121/77 mmHg  Pulse  74  Temp(Src) 98.4 F (36.9 C) (Oral)  Resp 17  SpO2 100%  LMP 05/15/2015 Physical Exam  Constitutional: She is oriented to person, place, and time. She appears well-developed and well-nourished.  HENT:  Head: Normocephalic and atraumatic.  Eyes: Conjunctivae are normal. Right eye exhibits no discharge. Left eye exhibits no discharge.  Neck: Normal range of motion. Neck supple. No tracheal deviation present.  Cardiovascular: Normal rate and regular rhythm.   Pulmonary/Chest: Effort normal and breath sounds normal.  Abdominal: Soft. She exhibits no distension. There is no tenderness. There is no guarding.  Musculoskeletal: She exhibits tenderness (mild left flank). She exhibits no edema.  Neurological: She is alert and oriented to person, place, and time.  Skin: Skin is warm. No rash noted.  Psychiatric: She has a normal mood and affect.  Nursing note and vitals reviewed.   ED Course  Procedures (including critical care time)  Emergency Focused Ultrasound Exam Limited retroperitoneal ultrasound of kidneys  Performed and interpreted by Dr. Jodi MourningZavitz Indication: flank pain Focused abdominal ultrasound with both kidneys imaged in transverse and longitudinal planes in real-time. Interpretation: minimal left hydronephrosis visualized.   Images archived electronically  CPT Code: [id number] (limited retroperitoneal)  Labs Review Labs Reviewed  URINALYSIS, ROUTINE W REFLEX MICROSCOPIC (NOT AT [facility]) - Abnormal; Notable for the following:    APPearance CLOUDY (*)    Hgb urine dipstick TRACE (*)    All other components within normal limits  CBC WITH DIFFERENTIAL/PLATELET - Abnormal; Notable for the following:    Hemoglobin 10.7 (*)    HCT 34.7 (*)    RDW 17.3 (*)    Platelets 565 (*)    All other components within normal limits  BASIC METABOLIC PANEL - Abnormal; Notable for the following:    Calcium 8.3 (*)    All other components within normal limits  URINE MICROSCOPIC-ADD ON -  Abnormal; Notable for the following:    Squamous Epithelial / LPF MANY (*)    Bacteria, UA FEW (*)    All other components within normal limits  POC URINE PREG, ED    Imaging Review No results found.   EKG Interpretation None      MDM   Final diagnoses:  Acute left flank pain  Hematuria   Left flank pain similar to kidney stones. Bedside ultrasound minimal hydronephrosis discussed with patient no significant nephrosis seen. Urinalysis reviewed trace hemoglobin. Discussed clinical suspicion for kidney stone and with multiple CT scans the past patient agrees with holding on radiation and follow-up with urology closely outpatient. Pain medicines given, pain improved in ER.  Results and differential diagnosis were discussed with the patient/parent/guardian. Close follow up outpatient was discussed, comfortable with the plan.   Medications  ketorolac (TORADOL) 15 MG/ML injection 15 mg (not administered)  HYDROmorphone (DILAUDID) injection 1 mg (1 mg Intravenous Given 05/22/15 1508)  sodium chloride 0.9 % bolus 500 mL (0 mLs Intravenous Stopped 05/22/15 1608)    Filed Vitals:   05/22/15 1417 05/22/15 1438  BP: 121/77   Pulse: 74   Temp: 98.4 F (36.9 C)   TempSrc: Oral   Resp: 17   SpO2: 99% 100%    Final diagnoses:  Acute left flank pain  Hematuria        Blane Ohara, MD 05/22/15 [phone]

## 2015-05-22 NOTE — ED Notes (Signed)
Awake. Verbally responsive. Resp even and unlabored. No audible adventitious breath sounds noted. ABC's intact. Pt reported having lt flank pain nonradiating. IV saline lock patent and intact. Family at bedside.

## 2015-05-22 NOTE — ED Notes (Signed)
Pt reported having lt flank pain nonradiating. Pt denies nausea, hematuria and dysuria. Pt denies urinary symptoms.

## 2015-05-26 ENCOUNTER — Encounter: Payer: Self-pay | Admitting: Family Medicine

## 2015-05-26 ENCOUNTER — Ambulatory Visit (INDEPENDENT_AMBULATORY_CARE_PROVIDER_SITE_OTHER): Payer: BLUE CROSS/BLUE SHIELD | Admitting: Family Medicine

## 2015-05-26 VITALS — BP 98/66 | HR 70 | Temp 98.3°F | Resp 16 | Ht 68.5 in | Wt 191.6 lb

## 2015-05-26 DIAGNOSIS — M545 Low back pain, unspecified: Secondary | ICD-10-CM

## 2015-05-26 DIAGNOSIS — N200 Calculus of kidney: Secondary | ICD-10-CM | POA: Diagnosis not present

## 2015-05-26 MED ORDER — HYDROCODONE-ACETAMINOPHEN 5-325 MG PO TABS: 1.0000 | ORAL_TABLET | ORAL | Status: DC | PRN

## 2015-05-26 MED ORDER — ONDANSETRON 8 MG PO TBDP: 8.0000 mg | ORAL_TABLET | Freq: Three times a day (TID) | ORAL | Status: DC | PRN

## 2015-05-26 NOTE — Progress Notes (Signed)
Subjective:     Patient ID: Kristen [name], female   DOB: 06/21/1971, 44 y.o.   MRN: [id number]  HPI  Chief Complaint  Patient presents with  . Flank Pain    patient comes in office today complaining of pain on her left side x 7 days. Patient states this past Saturday she had [facility] ultrasound showed kiney stone. Patient followed up with urology yesterday and 2 kidney stnes were noted on the left kidney.   Was not felt to have an infection and that kidney stones per CT were not obstructing. States she continues to have pain and nausea/vomiting at times. Reports she has both passed and had stone extractions in the past ([facility] [place]). Reports pain is persistent and unaffected by position   Review of Systems  Respiratory: Negative for shortness of breath.   Gastrointestinal: Negative for constipation.  Genitourinary: Negative for dysuria, vaginal discharge and pelvic pain.       Objective:   Physical Exam  Pulmonary/Chest: Effort normal and breath sounds normal. No respiratory distress.  Muscle strength in lower extremities 5/5. SLR's to 90 degrees without radiation of back pain. + left CVA tenderness    Assessment:     1. Left-sided low back pain without sciatica Possible residual pain after passing a stone - HYDROcodone-acetaminophen (NORCO) 5-325 MG per tablet; Take 1-2 tablets by mouth every 4 (four) hours as needed.  Dispense: 30 tablet; Refill: 0 - ondansetron (ZOFRAN ODT) 8 MG disintegrating tablet; Take 1 tablet (8 mg total) by mouth every 8 (eight) hours as needed for nausea or vomiting.  Dispense: 20 tablet; Refill: 0  2. Nephrolithiasis  - HYDROcodone-acetaminophen (NORCO) 5-325 MG per tablet; Take 1-2 tablets by mouth every 4 (four) hours as needed.  Dispense: 30 tablet; Refill: 0 - ondansetron (ZOFRAN ODT) 8 MG disintegrating tablet; Take 1 tablet (8 mg total) by mouth every 8 (eight) hours as needed for nausea or vomiting.  Dispense: 20  tablet; Refill: 0    Plan:     Will increase fluids and monitor for improvement. Consider repeat CT if not improving.

## 2015-05-26 NOTE — Patient Instructions (Signed)
Push fluids May need to repeat scan if not improving or passing a stone

## 2015-05-28 ENCOUNTER — Ambulatory Visit: Payer: Self-pay | Admitting: Family Medicine

## 2015-06-02 ENCOUNTER — Ambulatory Visit: Payer: Self-pay

## 2015-07-02 ENCOUNTER — Ambulatory Visit (INDEPENDENT_AMBULATORY_CARE_PROVIDER_SITE_OTHER): Payer: BLUE CROSS/BLUE SHIELD

## 2015-07-02 DIAGNOSIS — I2699 Other pulmonary embolism without acute cor pulmonale: Secondary | ICD-10-CM | POA: Diagnosis not present

## 2015-07-02 LAB — POCT INR
INR: 2.7
PT: 32.9

## 2015-07-02 NOTE — Patient Instructions (Signed)
PT/ INR  INR 2.7  Continue same dose Coumadin 15 mg M and Thurs and 12.5 mg the other days. Come back in 4 weeks. Call if nay questions or concerns.

## 2015-07-15 ENCOUNTER — Encounter: Payer: Self-pay | Admitting: Obstetrics and Gynecology

## 2015-07-15 ENCOUNTER — Ambulatory Visit (INDEPENDENT_AMBULATORY_CARE_PROVIDER_SITE_OTHER): Payer: BLUE CROSS/BLUE SHIELD | Admitting: Obstetrics and Gynecology

## 2015-07-15 VITALS — BP 115/64 | HR 74 | Ht 68.0 in | Wt 192.1 lb

## 2015-07-15 DIAGNOSIS — Z30433 Encounter for removal and reinsertion of intrauterine contraceptive device: Secondary | ICD-10-CM | POA: Diagnosis not present

## 2015-07-15 DIAGNOSIS — Z975 Presence of (intrauterine) contraceptive device: Secondary | ICD-10-CM

## 2015-07-15 NOTE — Progress Notes (Signed)
Patient ID: TRITIA ENDO, female   DOB: [birth year], 44 y.o.   MRN: [id number]   Kristen [name] is a 44 y.o. year old [id number] Caucasian female who presents for removal and replacement of a Mirena IUD. She was given informed consent for removal and reinsertion of her Mirena. Her Mirena was placed 04/2010, Patient's last menstrual period was 07/08/2015., and her pregnancy test today was negative.   The risks and benefits of the method and placement have been thouroughly reviewed with the patient and all questions were answered.  Specifically the patient is aware of failure rate of 12/998, expulsion of the IUD and of possible perforation.  The patient is aware of irregular bleeding due to the method and understands the incidence of irregular bleeding diminishes with time.  Signed copy of informed consent in chart.   Patient's last menstrual period was 07/08/2015. BP 115/64 mmHg  Pulse 74  Ht  (1.727 m)  Wt 192 lb 1.6 oz (87.136 kg)  BMI 29.22 kg/m2  LMP 07/08/2015 No results found for this or any previous visit (from the past 24 hour(s)).   Appropriate time out taken. A graves speculum was placed in the vagina.  The cervix was visualized, prepped using Betadine. The strings were visible. They were grasped and the Mirena was easily removed. The cervix was then grasped with a single-tooth tenaculum. The uterus was found to be neutral and it sounded to 8 cm.  Mirena IUD placed per manufacturer's recommendations without complications. The strings were trimmed to 3 cm.  The patient tolerated the procedure well.   The patient was given post procedure instructions, including signs and symptoms of infection and to check for the strings after each menses or each month, and refraining from intercourse or anything in the vagina for 3 days.  She was given a Mirena care card with date Mirena placed, and date Mirena to be removed.    [name], CNM

## 2015-07-15 NOTE — Patient Instructions (Signed)
Thank you for enrolling in MyChart. Please follow the instructions below to securely access your online medical record. MyChart allows you to send messages to your doctor, view your test results, renew your prescriptions, schedule appointments, and more.  How Do I Sign Up? 1. In your Internet browser, go to [email or web address] 2. Click on the New  User? link in the Sign In box.  3. Enter your MyChart Access Code exactly as it appears below. You will not need to use this code after you have completed the sign-up process. If you do not sign up before the expiration date, you must request a new code. MyChart Access Code: [email or web address] Expires: 07/21/2015  4:17 PM  4. Enter the last four digits of your Social Security Number (xxxx) and Date of Birth (mm/dd/yyyy) as indicated and click Next. You will be taken to the next sign-up page. 5. Create a MyChart ID. This will be your MyChart login ID and cannot be changed, so think of one that is secure and easy to remember. 6. Create a MyChart password. You can change your password at any time. 7. Enter your Password Reset Question and Answer and click Next. This can be used at a later time if you forget your password.  8. Select your communication preference, and if applicable enter your e-mail address. You will receive e-mail notification when new information is available in MyChart by choosing to receive e-mail notifications and filling in your e-mail. 9. Click Sign In. You can now view your medical record.   Additional Information If you have questions, you can email [email or web address] or call [phone] to talk to our MyChart staff. Remember, MyChart is NOT to be used for urgent needs. For medical emergencies, dial 911.   Levonorgestrel intrauterine device (IUD) What is this medicine? LEVONORGESTREL IUD (LEE voe nor jes trel) is a contraceptive (birth control) device. The device is placed inside the uterus by a  healthcare professional. It is used to prevent pregnancy and can also be used to treat heavy bleeding that occurs during your period. Depending on the device, it can be used for 3 to 5 years. This medicine may be used for other purposes; ask your health care provider or pharmacist if you have questions. COMMON BRAND NAME(S): Elveria Royals What should I tell my health care provider before I take this medicine? They need to know if you have any of these conditions: -abnormal Pap smear -cancer of the breast, uterus, or cervix -diabetes -endometritis -genital or pelvic infection now or in the past -have more than one sexual partner or your partner has more than one partner -heart disease -history of an ectopic or tubal pregnancy -immune system problems -IUD in place -liver disease or tumor -problems with blood clots or take blood-thinners -use intravenous drugs -uterus of unusual shape -vaginal bleeding that has not been explained -an unusual or allergic reaction to levonorgestrel, other hormones, silicone, or polyethylene, medicines, foods, dyes, or preservatives -pregnant or trying to get pregnant -breast-feeding How should I use this medicine? This device is placed inside the uterus by a health care professional. Talk to your pediatrician regarding the use of this medicine in children. Special care may be needed. Overdosage: If you think you have taken too much of this medicine contact a poison control center or emergency room at once. NOTE: This medicine is only for you. Do not share this medicine with others. What if I miss a dose? This does not apply. What  may interact with this medicine? Do not take this medicine with any of the following medications: -amprenavir -bosentan -fosamprenavir This medicine may also interact with the following medications: -aprepitant -barbiturate medicines for inducing sleep or treating seizures -bexarotene -griseofulvin -medicines to  treat seizures like carbamazepine, ethotoin, felbamate, oxcarbazepine, phenytoin, topiramate -modafinil -pioglitazone -rifabutin -rifampin -rifapentine -some medicines to treat HIV infection like atazanavir, indinavir, lopinavir, nelfinavir, tipranavir, ritonavir -St. John's wort -warfarin This list may not describe all possible interactions. Give your health care provider a list of all the medicines, herbs, non-prescription drugs, or dietary supplements you use. Also tell them if you smoke, drink alcohol, or use illegal drugs. Some items may interact with your medicine. What should I watch for while using this medicine? Visit your doctor or health care professional for regular check ups. See your doctor if you or your partner has sexual contact with others, becomes HIV positive, or gets a sexual transmitted disease. This product does not protect you against HIV infection (AIDS) or other sexually transmitted diseases. You can check the placement of the IUD yourself by reaching up to the top of your vagina with clean fingers to feel the threads. Do not pull on the threads. It is a good habit to check placement after each menstrual period. Call your doctor right away if you feel more of the IUD than just the threads or if you cannot feel the threads at all. The IUD may come out by itself. You may become pregnant if the device comes out. If you notice that the IUD has come out use a backup birth control method like condoms and call your health care provider. Using tampons will not change the position of the IUD and are okay to use during your period. What side effects may I notice from receiving this medicine? Side effects that you should report to your doctor or health care professional as soon as possible: -allergic reactions like skin rash, itching or hives, swelling of the face, lips, or tongue -fever, flu-like symptoms -genital sores -high blood pressure -no menstrual period for 6 weeks  during use -pain, swelling, warmth in the leg -pelvic pain or tenderness -severe or sudden headache -signs of pregnancy -stomach cramping -sudden shortness of breath -trouble with balance, talking, or walking -unusual vaginal bleeding, discharge -yellowing of the eyes or skin Side effects that usually do not require medical attention (report to your doctor or health care professional if they continue or are bothersome): -acne -breast pain -change in sex drive or performance -changes in weight -cramping, dizziness, or faintness while the device is being inserted -headache -irregular menstrual bleeding within first 3 to 6 months of use -nausea This list may not describe all possible side effects. Call your doctor for medical advice about side effects. You may report side effects to FDA at 1-800-FDA-1088. Where should I keep my medicine? This does not apply. NOTE: This sheet is a summary. It may not cover all possible information. If you have questions about this medicine, talk to your doctor, pharmacist, or health care provider.  2015, Elsevier/Gold Standard. (2012-01-04 13:54:04)

## 2015-07-24 ENCOUNTER — Ambulatory Visit
Admission: EM | Admit: 2015-07-24 | Discharge: 2015-07-24 | Disposition: A | Discharge status: Home / Self Care | Payer: BLUE CROSS/BLUE SHIELD | Attending: Internal Medicine | Admitting: Internal Medicine

## 2015-07-24 DIAGNOSIS — L42 Pityriasis rosea: Secondary | ICD-10-CM | POA: Diagnosis not present

## 2015-07-24 MED ORDER — HYDROCORTISONE VALERATE 0.2 % EX OINT: 1.0000 "application " | TOPICAL_OINTMENT | Freq: Two times a day (BID) | CUTANEOUS | Status: DC

## 2015-07-24 MED ORDER — ACYCLOVIR 800 MG PO TABS: 800.0000 mg | ORAL_TABLET | Freq: Three times a day (TID) | ORAL | Status: AC

## 2015-07-24 NOTE — Discharge Instructions (Signed)
Prescriptions for westcort ointment (steroid) and acyclovir (antiviral medicine) were sent to the [facility] in [place].  Recheck or followup with Dr Elease Hashimoto if new fever >100.5 or if itching is not controlled.  Anticipate gradual improvement over the next 4-6 weeks.  Pityriasis Rosea Pityriasis rosea is a rash which is probably caused by a virus. It generally starts as a scaly, red patch on the trunk (the area of the body that a t-shirt would cover) but does not appear on sun exposed areas. The rash is usually preceded by an initial larger spot called the "herald patch" a week or more before the rest of the rash appears. Generally within one to two days the rash appears rapidly on the trunk, upper arms, and sometimes the upper legs. The rash usually appears as flat, oval patches of scaly pink color. The rash can also be raised and one is able to feel it with a finger. The rash can also be finely crinkled and may slough off leaving a ring of scale around the spot. Sometimes a mild sore throat is present with the rash. It usually affects children and young adults in the spring and autumn. Women are more frequently affected than men. TREATMENT  Pityriasis rosea is a self-limited condition. This means it goes away within 4 to 8 weeks without treatment. The spots may persist for several months, especially in darker-colored skin after the rash has resolved and healed. Benadryl and steroid creams may be used if itching is a problem. SEEK MEDICAL CARE IF:   Your rash does not go away or persists longer than three months.  You develop fever and joint pain.  You develop severe headache and confusion.  You develop breathing difficulty, vomiting and/or extreme weakness. Document Released: 01/10/2002 Document Revised: 02/26/2012 Document Reviewed: 01/29/2009 [facility] Patient Information 2015 [place], [place]. This information is not intended to replace advice given to you by your health care provider. Make sure you  discuss any questions you have with your health care provider.

## 2015-07-24 NOTE — ED Provider Notes (Addendum)
CSN: [id number]     Arrival date & time 07/24/15  1424 History   First MD Initiated Contact with Patient 07/24/15 1541     Chief Complaint  Patient presents with  . Rash   HPI  Patient is a 44 year old lady who presents with the onset of an itchy rash on her chest and back today. Yesterday, she had some tactile temperature, malaise, which is improved today. No abdominal discomfort, except for maybe a little nausea and some loose stools after eating for about 2 days. No runny nose, no congestion, no cough, no sore throat. She did recently have a very big fever blister on her lip.  Past Medical History  Diagnosis Date  . Kidney stones   . Pulmonary embolism   . RLS (restless legs syndrome)   . Bipolar disorder   . DVT (deep venous thrombosis)   . Anxiety   . GERD (gastroesophageal reflux disease)   . Cholelithiasis    Past Surgical History  Procedure Laterality Date  . Gastric bypass    . Tonsillectomy    . Kidney stone removal    . Knee arthroscopy    . Cholecystectomy    . Hernia repair    . Cervical biopsy  w/ loop electrode excision  2007   Family History  Problem Relation Age of Onset  . Diabetes Father   . Stroke Father   . Heart attack Father   . Kidney failure Father   . Hypertension Father   . Colon cancer Paternal Grandfather   . Hypertension Paternal Grandfather   . Breast cancer Paternal Grandmother   . Diabetes Maternal Grandfather   . Stroke Maternal Grandfather   . Uterine cancer Maternal Grandmother   . Ulcers Maternal Grandmother    History  Substance Use Topics  . Smoking status: Never Smoker   . Smokeless tobacco: Never Used  . Alcohol Use: No   OB History    Gravida Para Term Preterm AB TAB SAB Ectopic Multiple Living   Review of Systems  All other systems reviewed and are negative.   Allergies  Codeine  Home Medications   Prior to Admission medications   Medication Sig Start Date End Date Taking? Authorizing  Provider         ALPRAZolam Prudy Feeler) 0.5 MG tablet Take 0.5 mg by mouth See admin instructions. 0.5 mg  Three times daily and 1 mg at bedtime 10/21/14   Historical Provider, MD  celecoxib (CELEBREX) 200 MG capsule Take 1 capsule by mouth daily. 06/28/15 07/28/15  Historical Provider, MD  HYDROcodone-acetaminophen (NORCO) 5-325 MG per tablet Take 1-2 tablets by mouth every 4 (four) hours as needed. Patient not taking: Reported on 07/02/2015 05/26/15   Anola Gurney, PA         lamoTRIgine (LAMICTAL) 200 MG tablet Take 200 mg by mouth daily.    Historical Provider, MD  levonorgestrel (MIRENA) 20 MCG/24HR IUD by Intrauterine route. 10/08/07   Historical Provider, MD  Lurasidone HCl 120 MG TABS Take 120 mg by mouth at bedtime.    Historical Provider, MD  ondansetron (ZOFRAN ODT) 8 MG disintegrating tablet Take 1 tablet (8 mg total) by mouth every 8 (eight) hours as needed for nausea or vomiting. 05/26/15   Anola Gurney, PA  rotigotine (NEUPRO) 2 MG/24HR Place 1 patch onto the skin daily. 01/11/15   Huston Foley, MD  sertraline (ZOLOFT) 25 MG tablet Take 50 mg  by mouth daily.     Historical Provider, MD  Vitamin K, Phytonadione, 100 MCG TABS Take 100 mcg by mouth daily. Take with warfarin 04/28/15   Historical Provider, MD  warfarin (COUMADIN) 5 MG tablet Take 12.5-15 mg by mouth daily. Monday and Thursday 15 mg and take 12.5 on other days    Historical Provider, MD   BP 120/82 mmHg  Pulse 78  Temp(Src) 97.6 F (36.4 C) (Tympanic)  Resp 20  Ht 5\' 8"  (1.727 m)  Wt 185 lb (83.915 kg)  BMI 28.14 kg/m2  SpO2 100%  LMP 07/08/2015 Physical Exam  Constitutional: She is oriented to person, place, and time. No distress.  Alert, nicely groomed  HENT:  Head: Atraumatic.  Eyes:  Conjugate gaze, no eye redness/drainage  Neck: Neck supple.  Cardiovascular: Normal rate.   Pulmonary/Chest: No respiratory distress.  Abdominal: She exhibits no distension.  Musculoskeletal: Normal range of motion.  No leg  swelling  Neurological: She is alert and oriented to person, place, and time.  Skin: Skin is warm and dry.  No cyanosis Numerous red bumps in patches over the torso anterior and back, with a larger one on the right shoulder blade, that was the first bump to erupt. Some of the larger bumps are faintly scaly. No vesicles, no ulceration, no crusting. A few bumps that appeared on the proximal thighs.  Nursing note and vitals reviewed.   ED Course  Procedures none   MDM   1. Pityriasis rosea    Discharge Medication List as of 07/24/2015  3:56 PM    START taking these medications   Details  acyclovir (ZOVIRAX) 800 MG tablet Take 1 tablet (800 mg total) by mouth 3 (three) times daily., Starting 07/24/2015, Until Wed 07/28/15, Normal    hydrocortisone valerate ointment (WESTCORT) 0.2 % Apply 1 application topically 2 (two) times daily., Starting 07/24/2015, Until Discontinued, Normal       Recheck or follow-up with PCP/Dr. Elease Hashimoto, if itching is uncontrolled or for new fever greater than 100.5. Anticipate gradual improvement in rash over the next 4-6 weeks.    Eustace Moore, MD 07/24/15 1601  Eustace Moore, MD 07/24/15 1700

## 2015-07-24 NOTE — ED Notes (Signed)
Just this morning started with rash that is itchy.

## 2015-07-30 ENCOUNTER — Ambulatory Visit (INDEPENDENT_AMBULATORY_CARE_PROVIDER_SITE_OTHER): Payer: BLUE CROSS/BLUE SHIELD

## 2015-07-30 DIAGNOSIS — I2699 Other pulmonary embolism without acute cor pulmonale: Secondary | ICD-10-CM

## 2015-07-30 LAB — POCT INR
INR: 3.5
PT: 41.8

## 2015-07-30 NOTE — Patient Instructions (Signed)
Take Coumadin 12.5 mg daily and come back in 2 weeks. Call if any questions or concerns.

## 2015-08-02 ENCOUNTER — Encounter: Payer: Self-pay | Admitting: Family Medicine

## 2015-08-02 ENCOUNTER — Ambulatory Visit (INDEPENDENT_AMBULATORY_CARE_PROVIDER_SITE_OTHER): Payer: BLUE CROSS/BLUE SHIELD | Admitting: Family Medicine

## 2015-08-02 VITALS — BP 110/52 | HR 101 | Temp 98.6°F | Resp 106 | Ht 68.5 in | Wt 192.2 lb

## 2015-08-02 DIAGNOSIS — Z9229 Personal history of other drug therapy: Secondary | ICD-10-CM

## 2015-08-02 DIAGNOSIS — R319 Hematuria, unspecified: Secondary | ICD-10-CM

## 2015-08-02 LAB — POCT URINALYSIS DIPSTICK
Bilirubin, UA: NEGATIVE
Glucose, UA: NEGATIVE
KETONES UA: NEGATIVE
Leukocytes, UA: NEGATIVE
Nitrite, UA: NEGATIVE
PROTEIN UA: NEGATIVE
SPEC GRAV UA: 1.015
Urobilinogen, UA: 1
pH, UA: 6.5

## 2015-08-02 LAB — POCT INR: INR: 2.6

## 2015-08-02 NOTE — Patient Instructions (Signed)
Increase fluids. If you continue to see blood in your urine hold you Coumadin x one dose.

## 2015-08-02 NOTE — Progress Notes (Signed)
Subjective:     Patient ID: Kristen [name], female   DOB: Jan 13, 1971, 44 y.o.   MRN: [id number]  HPI  Chief Complaint  Patient presents with  . Hematuria    Patient comes in office today with concerns of blood in her urine. Patient reports that last week when she had her PT checked it was at 3.5 and was advised to watch for bleeding and brusing  First had painless urinary bleeding yesterday and more today.   Review of Systems  Genitourinary:       Hx of kidney stones       Objective:   Physical Exam  Constitutional: She appears well-developed and well-nourished. No distress.       Assessment:    1. Hematuria - POCT urinalysis dipstick - POCT INR  2. History of anticoagulant therapy - POCT INR    Plan:    Increase fluids, may hold x one dose if bleeding continues. F/u Protime pending 8/26.

## 2015-08-13 ENCOUNTER — Ambulatory Visit: Payer: BLUE CROSS/BLUE SHIELD

## 2015-10-07 ENCOUNTER — Ambulatory Visit: Payer: BLUE CROSS/BLUE SHIELD | Admitting: Family Medicine

## 2015-10-08 ENCOUNTER — Encounter: Payer: Self-pay | Admitting: Family Medicine

## 2015-10-08 ENCOUNTER — Ambulatory Visit (INDEPENDENT_AMBULATORY_CARE_PROVIDER_SITE_OTHER): Payer: BLUE CROSS/BLUE SHIELD | Admitting: Family Medicine

## 2015-10-08 VITALS — BP 104/72 | HR 60 | Temp 98.0°F | Resp 16 | Ht 68.5 in | Wt 197.0 lb

## 2015-10-08 DIAGNOSIS — Z0000 Encounter for general adult medical examination without abnormal findings: Secondary | ICD-10-CM | POA: Diagnosis not present

## 2015-10-08 DIAGNOSIS — R5383 Other fatigue: Secondary | ICD-10-CM

## 2015-10-08 DIAGNOSIS — E611 Iron deficiency: Secondary | ICD-10-CM | POA: Diagnosis not present

## 2015-10-08 DIAGNOSIS — E538 Deficiency of other specified B group vitamins: Secondary | ICD-10-CM | POA: Diagnosis not present

## 2015-10-08 DIAGNOSIS — E7800 Pure hypercholesterolemia, unspecified: Secondary | ICD-10-CM | POA: Diagnosis not present

## 2015-10-08 DIAGNOSIS — Z23 Encounter for immunization: Secondary | ICD-10-CM | POA: Diagnosis not present

## 2015-10-08 DIAGNOSIS — R7309 Other abnormal glucose: Secondary | ICD-10-CM

## 2015-10-08 DIAGNOSIS — G479 Sleep disorder, unspecified: Secondary | ICD-10-CM

## 2015-10-08 DIAGNOSIS — I2699 Other pulmonary embolism without acute cor pulmonale: Secondary | ICD-10-CM | POA: Diagnosis not present

## 2015-10-08 DIAGNOSIS — E559 Vitamin D deficiency, unspecified: Secondary | ICD-10-CM

## 2015-10-08 LAB — POCT INR
INR: 1.2
PT: 14.7

## 2015-10-08 NOTE — Addendum Note (Signed)
Addended by: Kavin LeechWALSH, LAURA E on: 10/08/2015 03:45 PM   Modules accepted: Orders

## 2015-10-08 NOTE — Progress Notes (Signed)
Patient ID: Kristen [name], female   DOB: 1971/07/07, 44 y.o.   MRN: [id number]         Patient: Kristen [name], Female    DOB: [birth year], 44 y.o.   MRN: [id number] Visit Date: 10/08/2015  Today's Provider: Lorie Phenix, MD   Chief Complaint  Patient presents with  . Annual Exam   Subjective:    Annual physical exam Kristen [name] is a 44 y.o. female who presents today for health maintenance and complete physical. She feels fairly well.  Reviewed health maintenance issues.     Also, she is having trouble with fatigue and sore muscles/joints.  She reports exercising regulary.  She walks all day long.  She reports she is sleeping poorly.  PT states she wakes up a few times during the night.  She does not wake up feeling refreshed.  She is concern that her sleep apnea may be returning.  Had this before her weight loss surgery and feels it is returning secondary to weight gain.  Also needs her PT checked. Has history of PEs and needs chronic anticoagulation. No episodes of bleeding. Is taking her medication regularly.  Also taking her Vitamin K. She is difficult to regulate, but does not want to try newer agents. Afraid how they will be absorbed with no way to keep track of them.   -----------------------------------------------------------------   Review of Systems  Constitutional: Positive for fatigue. Negative for fever, chills, diaphoresis, activity change, appetite change and unexpected weight change.  HENT: Negative.   Eyes: Negative.   Respiratory: Negative.   Cardiovascular: Negative.   Gastrointestinal: Negative.   Endocrine: Negative.   Genitourinary: Negative.   Musculoskeletal: Positive for myalgias and arthralgias. Negative for back pain, joint swelling, gait problem, neck pain and neck stiffness.  Skin: Negative.   Allergic/Immunologic: Negative.   Neurological: Negative.   Psychiatric/Behavioral: Positive for decreased concentration. Negative for suicidal  ideas, hallucinations, behavioral problems, confusion, sleep disturbance, self-injury, dysphoric mood and agitation. The patient is nervous/anxious. The patient is not hyperactive.     Social History She  reports that she has never smoked. She has never used smokeless tobacco. She reports that she does not drink alcohol or use illicit drugs. Social History   Social History  . Marital Status: Married    Spouse Name: N/A  . Number of Children: 3  . Years of Education: N/A   Occupational History  . teacher's assistant    Social History Main Topics  . Smoking status: Never Smoker   . Smokeless tobacco: Never Used  . Alcohol Use: No  . Drug Use: No  . Sexual Activity:    Partners: Male    Birth Control/ Protection: IUD   Other Topics Concern  . None   Social History Narrative    Patient Active Problem List   Diagnosis Date Noted  . Allergic rhinitis, seasonal 05/20/2015  . Abnormal cells of cervix 05/20/2015  . Complication of surgery 05/20/2015  . Deep phlebitis-leg (HCC) 05/20/2015  . Fatty infiltration of liver 05/20/2015  . Bariatric surgery status 05/20/2015  . Iron deficiency 05/20/2015  . PE (pulmonary embolism) 05/20/2015  . Restless leg 05/20/2015  . B12 deficiency 05/20/2015  . History of anticoagulant therapy 05/20/2015  . Abdominal pain, epigastric 08/28/2012  . Abnormal blood sugar 12/06/2009  . Avitaminosis D 10/01/2009  . Chronic nonalcoholic liver disease 08/27/2009  . Hypercholesteremia 10/08/2007  . Bilateral polycystic ovarian syndrome 10/08/2007  . POLYCYSTIC OVARY 02/14/2007  . OBESITY, NOS 02/14/2007  .  BIPOLAR DISORDER 02/14/2007  . DEEP VEIN THROMBOPHLEBITIS, LEG 02/14/2007    Past Surgical History  Procedure Laterality Date  . Gastric bypass    . Tonsillectomy    . Kidney stone removal    . Knee arthroscopy    . Cholecystectomy    . Hernia repair    . Cervical biopsy  w/ loop electrode excision  2007    Family History  Family  Status  Relation Status Death Age  . Mother Alive   . Father Deceased   . Maternal Grandmother Deceased   . Maternal Grandfather Deceased   . Paternal Grandmother Deceased   . Paternal Grandfather Deceased    Her family history includes Breast cancer in her paternal grandmother; Colon cancer in her paternal grandfather; Diabetes in her father and maternal grandfather; Heart attack in her father; Hypertension in her father and paternal grandfather; Kidney failure in her father; Stroke in her father and maternal grandfather; Ulcers in her maternal grandmother; Uterine cancer in her maternal grandmother.    Allergies  Allergen Reactions  . Codeine Nausea Only    Previous Medications   ALPRAZOLAM (XANAX) 0.5 MG TABLET    Take 0.5 mg by mouth See admin instructions. 0.5 mg  Three times daily and 1 mg at bedtime   HYDROCORTISONE VALERATE OINTMENT (WESTCORT) 0.2 %    Apply 1 application topically 2 (two) times daily.   LAMOTRIGINE (LAMICTAL) 200 MG TABLET    Take 200 mg by mouth daily.   LEVONORGESTREL (MIRENA) 20 MCG/24HR IUD    by Intrauterine route.   LURASIDONE HCL 120 MG TABS    Take 120 mg by mouth at bedtime.   ROTIGOTINE (NEUPRO) 2 MG/24HR    Place 1 patch onto the skin daily.   SERTRALINE (ZOLOFT) 25 MG TABLET    Take 50 mg by mouth daily.    VITAMIN K, PHYTONADIONE, 100 MCG TABS    Take 100 mcg by mouth daily. Take with warfarin   WARFARIN (COUMADIN) 5 MG TABLET    Take 12.5-15 mg by mouth daily. Monday and Thursday 15 mg and take 12.5 on other days    Patient Care Team: Lorie PhenixNancy Latissa Frick, MD as PCP - General (Family Medicine)     Objective:   Vitals: BP 104/72 mmHg  Pulse 60  Temp(Src) 98 F (36.7 C) (Oral)  Resp 16  Ht 5' 8.5" (1.74 m)  Wt 197 lb (89.359 kg)  BMI 29.51 kg/m2  LMP 09/18/2015 (Within Days)   Physical Exam  Constitutional: She is oriented to person, place, and time. She appears well-developed and well-nourished.  HENT:  Head: Normocephalic and atraumatic.   Right Ear: Tympanic membrane, external ear and ear canal normal.  Left Ear: Tympanic membrane, external ear and ear canal normal.  Nose: Nose normal.  Mouth/Throat: Uvula is midline, oropharynx is clear and moist and mucous membranes are normal.  Eyes: Conjunctivae, EOM and lids are normal. Pupils are equal, round, and reactive to light.  Neck: Trachea normal and normal range of motion. Neck supple. Carotid bruit is not present. No thyroid mass and no thyromegaly present.  Cardiovascular: Normal rate, regular rhythm and normal heart sounds.   Pulmonary/Chest: Effort normal and breath sounds normal.  Abdominal: Soft. Normal appearance and bowel sounds are normal. There is no hepatosplenomegaly. There is no tenderness.  Musculoskeletal: Normal range of motion.  Lymphadenopathy:    She has no cervical adenopathy.    She has no axillary adenopathy.  Neurological: She is alert and oriented  to person, place, and time. She has normal strength. No cranial nerve deficit.  Skin: Skin is warm, dry and intact.  Psychiatric: She has a normal mood and affect. Her speech is normal and behavior is normal. Judgment and thought content normal. Cognition and memory are normal.     Depression Screen No flowsheet data found.    Assessment & Plan:     Routine Health Maintenance and Physical Exam  Exercise Activities and Dietary recommendations Goals    None      Immunization History  Administered Date(s) Administered  . Tdap 09/24/2011    Health Maintenance  Topic Date Due  . HIV Screening  02/19/1987  . INFLUENZA VACCINE  07/19/2015  . PAP SMEAR  01/28/2016  . TETANUS/TDAP  09/23/2021      Discussed health benefits of physical activity, and encouraged her to engage in regular exercise appropriate for her age and condition.   1. Annual physical exam Eat healthy and exercise.   2. PE (pulmonary embolism) Low today. Adjust as noted, 12.5 daily except 15 mg on Mon, Wed, Fri,  and  recheck in 4 weeks.   - POCT INR Results for orders placed or performed in visit on 08/02/15  POCT urinalysis dipstick  Result Value Ref Range   Color, UA yellow    Clarity, UA cloudy    Glucose, UA negative    Bilirubin, UA negative    Ketones, UA negative    Spec Grav, UA 1.015    Blood, UA large    pH, UA 6.5    Protein, UA negative    Urobilinogen, UA 1.0    Nitrite, UA negative    Leukocytes, UA Negative Negative  POCT INR  Result Value Ref Range   INR 2.6     3. B12 deficiency Has been low, will recheck.  - Vitamin B12  4. Avitaminosis D Has been low in the past, will recheck.  - Vit D  25 hydroxy (rtn osteoporosis monitoring)  5. Iron deficiency - CBC with Differential/Platelet  6. Hypercholesteremia - Lipid panel  7. Abnormal blood sugar Will make sure sugar ok, secondary to weight gain.  - Comprehensive metabolic panel - Hemoglobin A1c  8. Other fatigue Will check labs.   - TSH   9. Sleep disorder May have recurrent apnea secondary to weight gain.  Will order sleep study.  - Ambulatory referral to Sleep Studies   Patient was seen and examined by Leo Grosser, MD, and note scribed by Kavin Leech, CMA.  I have reviewed the document for accuracy and completeness and I agree with above. Leo Grosser, MD   Lorie Phenix, MD

## 2015-10-08 NOTE — Patient Instructions (Signed)
Increase Coumadin to 15mg  Monday and Thursday and 12.5mg  the rest of the week.  Recheck in two weeks.

## 2015-10-09 LAB — COMPREHENSIVE METABOLIC PANEL
ALK PHOS: 79 IU/L (ref 39–117)
ALT: 11 IU/L (ref 0–32)
AST: 21 IU/L (ref 0–40)
Albumin/Globulin Ratio: 1.8 (ref 1.1–2.5)
Albumin: 4.1 g/dL (ref 3.5–5.5)
BUN / CREAT RATIO: 9 (ref 9–23)
BUN: 6 mg/dL (ref 6–24)
Bilirubin Total: 1 mg/dL (ref 0.0–1.2)
CHLORIDE: 103 mmol/L (ref 97–106)
CO2: 24 mmol/L (ref 18–29)
Calcium: 8.6 mg/dL — ABNORMAL LOW (ref 8.7–10.2)
Creatinine, Ser: 0.7 mg/dL (ref 0.57–1.00)
GFR calc Af Amer: 123 mL/min/{1.73_m2} (ref >59)
GFR calc non Af Amer: 106 mL/min/{1.73_m2} (ref >59)
GLOBULIN, TOTAL: 2.3 g/dL (ref 1.5–4.5)
Glucose: 82 mg/dL (ref 65–99)
Potassium: 4.2 mmol/L (ref 3.5–5.2)
Sodium: 140 mmol/L (ref 136–144)
Total Protein: 6.4 g/dL (ref 6.0–8.5)

## 2015-10-09 LAB — TSH: TSH: 0.587 u[IU]/mL (ref 0.450–4.500)

## 2015-10-09 LAB — CBC WITH DIFFERENTIAL/PLATELET
Basophils Absolute: 0 10*3/uL (ref 0.0–0.2)
Basos: 1 %
EOS (ABSOLUTE): 0.1 10*3/uL (ref 0.0–0.4)
Eos: 3 %
HEMATOCRIT: 32.4 % — AB (ref 34.0–46.6)
HEMOGLOBIN: 10.1 g/dL — AB (ref 11.1–15.9)
Immature Grans (Abs): 0 10*3/uL (ref 0.0–0.1)
Immature Granulocytes: 0 %
LYMPHS ABS: 1.5 10*3/uL (ref 0.7–3.1)
LYMPHS: 26 %
MCH: 24.8 pg — ABNORMAL LOW (ref 26.6–33.0)
MCHC: 31.2 g/dL — AB (ref 31.5–35.7)
MCV: 80 fL (ref 79–97)
MONOCYTES: 9 %
Monocytes Absolute: 0.5 10*3/uL (ref 0.1–0.9)
NEUTROS ABS: 3.5 10*3/uL (ref 1.4–7.0)
Neutrophils: 61 %
Platelets: 652 10*3/uL — ABNORMAL HIGH (ref 150–379)
RBC: 4.07 x10E6/uL (ref 3.77–5.28)
RDW: 16.2 % — ABNORMAL HIGH (ref 12.3–15.4)
WBC: 5.7 10*3/uL (ref 3.4–10.8)

## 2015-10-09 LAB — LIPID PANEL
Chol/HDL Ratio: 2.7 ratio units (ref 0.0–4.4)
Cholesterol, Total: 203 mg/dL — ABNORMAL HIGH (ref 100–199)
HDL: 75 mg/dL (ref >39)
LDL CALC: 115 mg/dL — AB (ref 0–99)
TRIGLYCERIDES: 66 mg/dL (ref 0–149)
VLDL CHOLESTEROL CAL: 13 mg/dL (ref 5–40)

## 2015-10-09 LAB — HEMOGLOBIN A1C
Est. average glucose Bld gHb Est-mCnc: 126 mg/dL
Hgb A1c MFr Bld: 6 % — ABNORMAL HIGH (ref 4.8–5.6)

## 2015-10-09 LAB — VITAMIN D 25 HYDROXY (VIT D DEFICIENCY, FRACTURES): Vit D, 25-Hydroxy: 9 ng/mL — ABNORMAL LOW (ref 30.0–100.0)

## 2015-10-09 LAB — VITAMIN B12: Vitamin B-12: 136 pg/mL — ABNORMAL LOW (ref 211–946)

## 2015-10-18 ENCOUNTER — Telehealth: Payer: Self-pay | Admitting: Family Medicine

## 2015-10-18 NOTE — Telephone Encounter (Signed)
Pt stated that she was in on 10/08/15 and wanted to check the status of her sleep study. Thanks TNP

## 2015-10-19 NOTE — Telephone Encounter (Signed)
LMTCB

## 2015-10-19 NOTE — Telephone Encounter (Signed)
Pt [facility]Their office will contact pt

## 2015-10-22 ENCOUNTER — Ambulatory Visit (INDEPENDENT_AMBULATORY_CARE_PROVIDER_SITE_OTHER): Payer: BLUE CROSS/BLUE SHIELD

## 2015-10-22 DIAGNOSIS — I2699 Other pulmonary embolism without acute cor pulmonale: Secondary | ICD-10-CM

## 2015-10-22 LAB — POCT INR
INR: 2.7
PT: 31.9

## 2015-10-22 NOTE — Patient Instructions (Signed)
Anticoagulation Dose Instructions as of 10/22/2015      Glynis SmilesSun Mon Tue Wed Thu Fri Sat   New Dose 12.5 mg 15 mg 12.5 mg 15 mg 12 mg 15 mg 12.5 mg    Description        Continue coumadin at 12.5 mg every day except Monday, Wednesday and Friday take 15 mg. Re check in 4 weeks.

## 2015-10-26 ENCOUNTER — Telehealth: Payer: Self-pay | Admitting: Family Medicine

## 2015-10-26 NOTE — Telephone Encounter (Signed)
Order for home sleep study faxed to [facility].Their office will contact pt.[facility] did not accept pt's insurance

## 2015-11-09 ENCOUNTER — Other Ambulatory Visit: Payer: Self-pay | Admitting: Family Medicine

## 2015-11-09 NOTE — Telephone Encounter (Signed)
Pharmacy sending refill request. Patient's last PT/INR check was on 10/22/2015 and was stable.

## 2015-11-19 ENCOUNTER — Ambulatory Visit (INDEPENDENT_AMBULATORY_CARE_PROVIDER_SITE_OTHER): Payer: BLUE CROSS/BLUE SHIELD | Admitting: Family Medicine

## 2015-11-19 DIAGNOSIS — I2699 Other pulmonary embolism without acute cor pulmonale: Secondary | ICD-10-CM | POA: Diagnosis not present

## 2015-11-19 LAB — POCT INR
INR: 2.7
PT: 32.8

## 2015-11-19 NOTE — Patient Instructions (Signed)
Continue coumadin at 12.5 mg every day except Monday, Wednesday and Friday take 15 mg. Re check in 4 weeks.

## 2015-11-19 NOTE — Patient Instructions (Signed)
Continue coumadin at 12.5 mg every day except Monday, Wednesday and Friday take 15 mg. Re check in 4 weeks. 

## 2015-11-25 ENCOUNTER — Ambulatory Visit: Payer: BLUE CROSS/BLUE SHIELD | Attending: Otolaryngology

## 2015-11-25 DIAGNOSIS — R0683 Snoring: Secondary | ICD-10-CM | POA: Diagnosis not present

## 2015-11-25 DIAGNOSIS — G4700 Insomnia, unspecified: Secondary | ICD-10-CM | POA: Insufficient documentation

## 2015-11-25 DIAGNOSIS — G2581 Restless legs syndrome: Secondary | ICD-10-CM | POA: Insufficient documentation

## 2015-12-14 ENCOUNTER — Ambulatory Visit (INDEPENDENT_AMBULATORY_CARE_PROVIDER_SITE_OTHER): Payer: BLUE CROSS/BLUE SHIELD | Admitting: Family Medicine

## 2015-12-14 ENCOUNTER — Encounter: Payer: Self-pay | Admitting: Family Medicine

## 2015-12-14 VITALS — BP 120/80 | HR 63 | Temp 98.1°F | Resp 16 | Ht 69.0 in | Wt 201.0 lb

## 2015-12-14 DIAGNOSIS — E559 Vitamin D deficiency, unspecified: Secondary | ICD-10-CM | POA: Diagnosis not present

## 2015-12-14 DIAGNOSIS — E538 Deficiency of other specified B group vitamins: Secondary | ICD-10-CM

## 2015-12-14 DIAGNOSIS — G479 Sleep disorder, unspecified: Secondary | ICD-10-CM

## 2015-12-14 DIAGNOSIS — I2699 Other pulmonary embolism without acute cor pulmonale: Secondary | ICD-10-CM

## 2015-12-14 LAB — POCT INR
INR: 3
PT: 36.2

## 2015-12-14 NOTE — Progress Notes (Signed)
Patient ID: Kristen JunglingChrissy M Vogt, female   DOB: 06/02/1971, 44 y.o.   MRN: [id number]        Patient: Kristen JunglingChrissy M Scialdone Female    DOB: 11/01/1971   44 y.o.   MRN: [id number] Visit Date: 12/14/2015  Today's Provider: Lorie PhenixNancy Levetta Bognar, MD   Chief Complaint  Patient presents with  . Follow-up    sleep study  . Follow-up    Pulmonary embolism   Subjective:    HPI  Follow up for sleep study  The patient [facility]. Patient advised to come in to discuss treatment options. Changes made at last visit include none.  Sleep study showed RLS and snoring but low AHI.   Is very fatigued still in the day.  Falls asleep frequently. Husband is very concerned that she still has sleep apnea. Hears her stop breathing.  She feels as fatigued as she did before her bypass. Is also gaining weight. Thyroid function normal.    ------------------------------------------------------------------------------------   Follow up for pulmonary embolism  The patient was last seen for this 4 weeks ago. Changes made at last visit include none.  She reports excellent compliance with treatment. She feels that condition is Unchanged. She is not having side effects. Patient reports she is taking Coumadin 15 mg on MWF and 12.5 mg on Sun., Tue., Thur., and Sat.  Lab Results  Component Value Date   INR 3.0 12/14/2015   INR 2.7 11/19/2015   INR 2.7 10/22/2015   PROTIME 46.2* 05/13/2015    ------------------------------------------------------------------------------------         Allergies  Allergen Reactions  . Codeine Nausea Only   Previous Medications   ALPRAZOLAM (XANAX) 0.5 MG TABLET    Take 0.5 mg by mouth See admin instructions. 0.5 mg  Three times daily and 1 mg at bedtime   CHOLECALCIFEROL (VITAMIN D) 2000 UNITS TABLET    Take 2,000 Units by mouth daily.   LAMOTRIGINE (LAMICTAL) 200 MG TABLET    Take 200 mg by mouth daily.   LEVONORGESTREL (MIRENA) 20  MCG/24HR IUD    by Intrauterine route.   LURASIDONE HCL 120 MG TABS    Take 120 mg by mouth at bedtime.   ROTIGOTINE (NEUPRO) 2 MG/24HR    Place 1 patch onto the skin daily.   SERTRALINE (ZOLOFT) 25 MG TABLET    Take 50 mg by mouth daily.    VITAMIN B-12 (CYANOCOBALAMIN) 100 MCG TABLET    Take 2,000 mcg by mouth daily.   VITAMIN K, PHYTONADIONE, 100 MCG TABS    Take 100 mcg by mouth daily. Take with warfarin   WARFARIN (COUMADIN) 5 MG TABLET    TAKE ONE TABLET BY MOUTH AS DIRECTED    Review of Systems  Constitutional: Positive for appetite change, fatigue and unexpected weight change.  Respiratory: Negative.   Cardiovascular: Negative.   Hematological: Negative.   Psychiatric/Behavioral: Positive for sleep disturbance. Negative for behavioral problems.    Social History  Substance Use Topics  . Smoking status: Never Smoker   . Smokeless tobacco: Never Used  . Alcohol Use: No   Objective:   BP 120/80 mmHg  Pulse 63  Temp(Src) 98.1 F (36.7 C) (Oral)  Resp 16  Ht 5\' 9"  (1.753 m)  Wt 201 lb (91.173 kg)  BMI 29.67 kg/m2  SpO2 98%  Physical Exam  Constitutional: She is oriented to person, place, and time. She appears well-developed and well-nourished.  Neurological: She is  alert and oriented to person, place, and time.  Psychiatric: She has a normal mood and affect. Her behavior is normal. Judgment and thought content normal.      Assessment & Plan:     1. PE (pulmonary embolism) At upper limits of goal.  Will change to 12.5 daily and 15 mg on Monday and Thursday. Recheck in 2 weeks. Wrote rx to check at home.    - POCT INR  2. Sleep disorder Sleep study did not confirm sleep apnea. Still appear to have sleep disorder. Will refer to sleep specialist.    - Ambulatory referral to Internal Medicine  3. B12 deficiency Has increased dose, will recheck.   - Vitamin B12  4. Avitaminosis D Stable. Check labs.   - VITAMIN D 25 Hydroxy (Vit-D Deficiency, Fractures)       Lorie Phenix, MD  [facility]

## 2015-12-16 LAB — VITAMIN B12: Vitamin B-12: 197 pg/mL — ABNORMAL LOW (ref 211–946)

## 2015-12-16 LAB — VITAMIN D 25 HYDROXY (VIT D DEFICIENCY, FRACTURES): Vit D, 25-Hydroxy: 16.3 ng/mL — ABNORMAL LOW (ref 30.0–100.0)

## 2015-12-17 ENCOUNTER — Ambulatory Visit: Payer: Self-pay

## 2016-01-06 ENCOUNTER — Other Ambulatory Visit: Payer: Self-pay | Admitting: Family Medicine

## 2016-01-06 DIAGNOSIS — I2699 Other pulmonary embolism without acute cor pulmonale: Secondary | ICD-10-CM

## 2016-01-13 ENCOUNTER — Ambulatory Visit (INDEPENDENT_AMBULATORY_CARE_PROVIDER_SITE_OTHER): Payer: BLUE CROSS/BLUE SHIELD | Admitting: Neurology

## 2016-01-13 ENCOUNTER — Encounter: Payer: Self-pay | Admitting: Neurology

## 2016-01-13 VITALS — BP 118/82 | HR 70 | Resp 16 | Ht 69.0 in | Wt 203.0 lb

## 2016-01-13 DIAGNOSIS — G4761 Periodic limb movement disorder: Secondary | ICD-10-CM | POA: Diagnosis not present

## 2016-01-13 DIAGNOSIS — G2581 Restless legs syndrome: Secondary | ICD-10-CM

## 2016-01-13 NOTE — Patient Instructions (Signed)
Since you have an appointment with Dr. Earl Gala for sleep on 02/01/16, we can wait things out. You are welcome to come back and see me after that or follow with Dr. Earl Gala.

## 2016-01-13 NOTE — Progress Notes (Signed)
Subjective:    Patient ID: Kristen [name] is a 45 y.o. female.  HPI     Interim history:   Kristen [name] is a 45 year old right-handed woman with an underlying medical history of polycystic ovary syndrome, obesity (status post gastric bypass surgery in October 2002), bipolar disorder, obstructive sleep apnea, kidney stones, deep vein thrombophlebitis in 2006 and history of pulmonary embolism in November 2010 (on Coumadin), who presents for followup consultation of her RLS of over 10 years duration. She is unaccompanied today. I last saw her on 01/11/15, at which time she reported doing well on the 2 mg dose of Neupro patch and felt it was helpful. She had an increase in her Latuda and a decrease in her Zoloft dose per her psychiatrist. She had no side effects with Neupro, in particular, she has had no significant sleepiness, no swelling, no nausea and no psychiatric side effects.  Today, 01/13/2016: She reports that her RLS have become worse. She says that her husband complains about her leg twitching at night. She had no interim medication changes. She has gained some weight in the past 12 months. Her primary care physician sent her for a sleep study. She had a baseline sleep study in [place] on 11/25/2015 interpreted by Dr. Margaretha Sheffield and I reviewed the report: Her AHI was 0.5 per hour, sleep efficiency 94.3%, REM latency 172.5 minutes, REM percentage 15.9%. Arousal index was 1.5 per hour. Average oxygen saturation was 98.7, nadir was 91.4%. PLM index was 78.8 with an arousal index of 0.2 per hour. Snoring was noted.   Previously:   I first met her on 07/09/14, at which time she reported that the immediate release Mirapex was not as effective. She denied any augmentation. She did have flareup in her symptoms when confined in a car. Symptoms were worse at night. She had no upper body symptoms. She had been on Zoloft for years. She was seen a psychiatrist for her bipolar disorder and was on [place].  Her insurance had denied long-acting Mirapex. I suggested a trial of Neupro patch. I started her on 1 mg strength and then increase it in August 2015 to 2 milligrams once daily.  She previously followed with Dr. Morene Antu and was last seen by him on 01/23/2013, at which time he felt that she had worsening RLS symptoms and started her on Mirapex ER and obtained blood work including methylmalonic acid which was normal, vitamin B12 which was low at 169, ferritin which was low at 7. She was called and advised to start taking iron supplements and to start taking B12 injections.   She was initially seen by Dr. Erling Cruz in March 2004 for involuntary movements of her arms, legs, shoulders, and neck occurring at night while asleep. This was thought to be periodic leg movements in sleep and she endorsed symptoms of RLS occurring later in the day. She had a sleep study in 2010 which showed moderate sleep apnea, severe snoring, mild desaturations, and moderate to severe periodic leg movements of sleep. She was started on CPAP at 10 cm. There was no family history of RLS. Her ferritin level at the time was 118. Her bipolar was treated with Abilify in the past and Latuda, and she has a history of hyperlipidemia as well as right knee arthroscopic surgery complicated by phlebitis, gastric bypass surgery which was complicated by pulmonary embolism despite a [place] filter in December 2000 and, kidney stones with multiple operations as well as polycystic ovary syndrome. Her BX  has been helpful. She denied impulse control disorder, postural dizziness or daytime somnolence with Mirapex.    Her Past Medical History Is Significant For: Past Medical History  Diagnosis Date  . Kidney stones   . Pulmonary embolism ([place])   . RLS (restless legs syndrome)   . Bipolar disorder ([place])   . DVT (deep venous thrombosis) ([place])   . Anxiety   . GERD (gastroesophageal reflux disease)   . Cholelithiasis     Her Past Surgical History Is  Significant For: Past Surgical History  Procedure Laterality Date  . Gastric bypass    . Tonsillectomy    . Kidney stone removal    . Knee arthroscopy    . Cholecystectomy    . Hernia repair    . Cervical biopsy  w/ loop electrode excision  2007    Her Family History Is Significant For: Family History  Problem Relation Age of Onset  . Diabetes Father   . Stroke Father   . Heart attack Father   . Kidney failure Father   . Hypertension Father   . Colon cancer Paternal Grandfather   . Hypertension Paternal Grandfather   . Breast cancer Paternal Grandmother   . Diabetes Maternal Grandfather   . Stroke Maternal Grandfather   . Uterine cancer Maternal Grandmother   . Ulcers Maternal Grandmother     Her Social History Is Significant For: Social History   Social History  . Marital Status: Married    Spouse Name: N/A  . Number of Children: 3  . Years of Education: N/A   Occupational History  . teacher's assistant    Social History Main Topics  . Smoking status: Never Smoker   . Smokeless tobacco: Never Used  . Alcohol Use: No  . Drug Use: No  . Sexual Activity:    Partners: Male    Birth Control/ Protection: IUD   Other Topics Concern  . None   Social History Narrative    Her Allergies Are:  Allergies  Allergen Reactions  . Codeine Nausea Only  :   Her Current Medications Are:  Outpatient Encounter Prescriptions as of 01/13/2016  Medication Sig  . ALPRAZolam (XANAX) 0.5 MG tablet Take 0.5 mg by mouth See admin instructions. 0.5 mg  Three times daily and 1 mg at bedtime  . Cholecalciferol (VITAMIN D) 2000 units tablet Take 2,000 Units by mouth daily.  Marland Kitchen lamoTRIgine (LAMICTAL) 200 MG tablet Take 200 mg by mouth daily.  Marland Kitchen levonorgestrel (MIRENA) 20 MCG/24HR IUD by Intrauterine route.  . Lurasidone HCl 120 MG TABS Take 120 mg by mouth at bedtime.  . rotigotine (NEUPRO) 2 MG/24HR Place 1 patch onto the skin daily.  . sertraline (ZOLOFT) 50 MG tablet   .  vitamin B-12 (CYANOCOBALAMIN) 100 MCG tablet Take 2,000 mcg by mouth daily.  . Vitamin K, Phytonadione, 100 MCG TABS Take 100 mcg by mouth daily. Take with warfarin  . warfarin (COUMADIN) 5 MG tablet TAKE ONE TABLET BY MOUTH AS DIRECTED  . [DISCONTINUED] sertraline (ZOLOFT) 25 MG tablet Take 50 mg by mouth daily.    No facility-administered encounter medications on file as of 01/13/2016.  :  Review of Systems:  Out of a complete 14 point review of systems, all are reviewed and negative with the exception of these symptoms as listed below:   Review of Systems  Neurological:       Patient feels that the Neupro patch is not working anymore.     Objective:  Neurologic  Exam  Physical Exam Physical Examination:   Filed Vitals:   01/13/16 1601  BP: 118/82  Pulse: 70  Resp: 16    General Examination: The patient is a very pleasant 45 y.o. female in no acute distress. She appears well-developed and well-nourished and well groomed.   HEENT: Normocephalic, atraumatic, pupils are equal, round and reactive to light and accommodation. Funduscopic exam is normal with sharp disc margins noted. Extraocular tracking is good without limitation to gaze excursion or nystagmus noted. Normal smooth pursuit is noted. Hearing is grossly intact. Face is symmetric with normal facial animation and normal facial sensation. Speech is clear with no dysarthria noted. There is no hypophonia. There is no lip, neck/head, jaw or voice tremor. Neck is supple with full range of passive and active motion. There are no carotid bruits on auscultation. Oropharynx exam reveals: mild mouth dryness, good dental hygiene and mild airway crowding, due to larger tongue. She has mild pharyngeal erythema. Mallampati is class I. Tongue protrudes centrally and palate elevates symmetrically.   Chest: Clear to auscultation without wheezing, rhonchi or crackles noted.  Heart: S1+S2+0, regular and normal without murmurs, rubs or gallops  noted.   Abdomen: Soft, non-tender and non-distended with normal bowel sounds appreciated on auscultation.  Extremities: There is no pitting edema in the distal lower extremities bilaterally. Pedal pulses are intact.  Skin: Warm and dry without trophic changes noted. There are no varicose veins.  Musculoskeletal: exam reveals no obvious joint deformities, tenderness or joint swelling or erythema.   Neurologically:  Mental status: The patient is awake, alert and oriented in all 4 spheres. Her immediate and remote memory, attention, language skills and fund of knowledge are appropriate. There is no evidence of aphasia, agnosia, apraxia or anomia. Speech is clear with normal prosody and enunciation. Thought process is linear. Mood is normal and affect is normal.  Cranial nerves II - XII are as described above under HEENT exam. In addition: shoulder shrug is normal with equal shoulder height noted. Motor exam: Normal bulk, strength and tone is noted. There is no drift, tremor or rebound. Romberg is negative. Reflexes are 2+ throughout. Fine motor skills and coordination: intact with normal finger taps, normal hand movements, normal rapid alternating patting, normal foot taps and normal foot agility.  Cerebellar testing: No dysmetria or intention tremor on finger to nose testing. Heel to shin is unremarkable bilaterally. There is no truncal or gait ataxia.  Sensory exam: intact to light touch, pinprick, vibration, temperature sense in the upper and lower extremities.  Gait, station and balance: She stands easily. No veering to one side is noted. No leaning to one side is noted. Posture is age-appropriate and stance is narrow based. Gait shows normal stride length and normal pace. No problems turning are noted. She turns en bloc. Tandem walk is unremarkable.             Assessment and Plan:   In summary, Kristen [name] is a very pleasant 45 year old female with an underlying medical history of  polycystic ovary syndrome, obesity, status post gastric bypass surgery in October 2002, bipolar disorder, obstructive sleep apnea, kidney stones, deep vein thrombophlebitis in 2006 and history of pulmonary embolism in November 2010, on Coumadin, who presents for followup consultation of her RLS of over 10 years duration, associated with PLMs. She did reasonably well on Mirapex long-acting in the past, but her insurance denied her Mirapex ER and she had suboptimal results on the generic Mirapex. She has been  on Neupro 2 mg with good results, but now feels that the Neupro is no longer as effective. Her husband complains about her leg twitching. Her recent baseline sleep study at an outside facility showed severe PLMS with minimal arousals. She says that she is scheduled to see Dr. Maxwell Caul, a sleep specialist on 02/01/2016. We mutually agreed that she should wait out the appointment. My next recommendation would be to increase the Neupro to 4 mg patch. I did advise her that her restless leg symptoms and PLMS may at least in part be secondary to her antidepressant medication. She is advised that she is welcome to follow-up with Dr. Maxwell Caul and I would be happy to see her back if needed. Her physical exam is nonfocal and I reassured her in that regard.

## 2016-06-16 ENCOUNTER — Ambulatory Visit (INDEPENDENT_AMBULATORY_CARE_PROVIDER_SITE_OTHER): Payer: BLUE CROSS/BLUE SHIELD | Admitting: Family Medicine

## 2016-06-16 DIAGNOSIS — I2699 Other pulmonary embolism without acute cor pulmonale: Secondary | ICD-10-CM | POA: Diagnosis not present

## 2016-06-16 LAB — POCT INR
INR: 2.2
PT: 26.4

## 2016-06-16 NOTE — Patient Instructions (Signed)
Anticoagulation Dose Instructions as of 06/16/2016      Kristen SmilesSun Mon Tue Wed Thu Fri Sat   New Dose 12.5 mg 15 mg 12.5 mg 15 mg 12 mg 15 mg 12.5 mg    Description        Continue coumadin at 12.5 mg every day except 15 mg on Monday and Thurs.Recheck in 4 weeks.

## 2016-06-29 ENCOUNTER — Ambulatory Visit (INDEPENDENT_AMBULATORY_CARE_PROVIDER_SITE_OTHER): Payer: BLUE CROSS/BLUE SHIELD | Admitting: Family Medicine

## 2016-06-29 ENCOUNTER — Encounter: Payer: Self-pay | Admitting: Family Medicine

## 2016-06-29 VITALS — BP 110/76 | HR 76 | Temp 98.0°F | Resp 16 | Wt 210.0 lb

## 2016-06-29 DIAGNOSIS — J069 Acute upper respiratory infection, unspecified: Secondary | ICD-10-CM

## 2016-06-29 MED ORDER — HYDROCODONE-HOMATROPINE 5-1.5 MG/5ML PO SYRP: ORAL_SOLUTION | ORAL | Status: DC

## 2016-06-29 NOTE — Progress Notes (Signed)
Subjective:     Patient ID: Kristen [name], female   DOB: 04/19/1971, 45 y.o.   MRN: [id number]  HPI  Chief Complaint  Patient presents with  . URI    x 3 days. Productive cough, chest pain, sore throat.  Husband was sick first and was seen by myself earlier today. Has been taking Mucinex for her sx.   Review of Systems     Objective:   Physical Exam  Constitutional: She appears well-developed and well-nourished. No distress.  Ears: T.M's intact without inflammation Throat: tonsils absent, no erythema Neck: mild anterior cervical tenderness without adenopathy Lungs: clear    Assessment:    1. Upper respiratory infection - HYDROcodone-homatropine (HYCODAN) 5-1.5 MG/5ML syrup; 5 ml 4-6 hours as needed for cough  Dispense: 240 mL; Refill: 0    Plan:    Discussed use of otc medication.

## 2016-06-29 NOTE — Patient Instructions (Signed)
Discussed use of Mucinex D for congestion, Delsym for cough, and Benadryl for postnasal drainage 

## 2016-07-07 ENCOUNTER — Ambulatory Visit (INDEPENDENT_AMBULATORY_CARE_PROVIDER_SITE_OTHER): Payer: BLUE CROSS/BLUE SHIELD | Admitting: Family Medicine

## 2016-07-07 ENCOUNTER — Encounter: Payer: Self-pay | Admitting: Family Medicine

## 2016-07-07 VITALS — BP 90/58 | HR 70 | Temp 98.4°F | Resp 16 | Ht 69.0 in | Wt 203.0 lb

## 2016-07-07 DIAGNOSIS — Z86711 Personal history of pulmonary embolism: Secondary | ICD-10-CM

## 2016-07-07 DIAGNOSIS — I2699 Other pulmonary embolism without acute cor pulmonale: Secondary | ICD-10-CM | POA: Diagnosis not present

## 2016-07-07 LAB — POCT INR
INR: 3.2
PT: 39

## 2016-07-07 MED ORDER — WARFARIN SODIUM 5 MG PO TABS: ORAL_TABLET | ORAL | Status: DC

## 2016-07-07 NOTE — Progress Notes (Signed)
Patient: Kristen JunglingChrissy M Trampe Female    DOB: 08/28/1971   45 y.o.   MRN: [id number] Visit Date: 07/07/2016  Today's Provider: Mila Merryonald Fisher, MD   Chief Complaint  Patient presents with  . Establish Care   Subjective:    HPI  PE (pulmonary embolism): From 12/14/2015-checked PT/INR.06/16/2016- recheck in 4 weeks.   Sleep disorder: From 12/14/2015- referred to sleep specialist due to could not confirm sleep apnea.  Avitaminosis D: From 12/14/2015- no changes  B12 deficiency: From 12/14/2015-no changes         Abnormal blood sugar: From 10/08/2015- checked labs, sowed blood sugar was just above normal.     Allergies  Allergen Reactions  . Codeine Nausea Only   Current Meds  Medication Sig  . ALPRAZolam (XANAX) 0.5 MG tablet Take 0.5 mg by mouth See admin instructions. 0.5 mg  Three times daily and 1 mg at bedtime  . Cholecalciferol (VITAMIN D) 2000 units tablet Take 2,000 Units by mouth daily.  Marland Kitchen. HYDROcodone-homatropine (HYCODAN) 5-1.5 MG/5ML syrup 5 ml 4-6 hours as needed for cough  . lamoTRIgine (LAMICTAL) 200 MG tablet Take 200 mg by mouth daily.  Marland Kitchen. levonorgestrel (MIRENA) 20 MCG/24HR IUD by Intrauterine route.  . Lurasidone HCl 120 MG TABS Take 120 mg by mouth at bedtime.  . sertraline (ZOLOFT) 100 MG tablet   . sertraline (ZOLOFT) 50 MG tablet   . Vitamin K, Phytonadione, 100 MCG TABS Take 100 mcg by mouth daily. Take with warfarin  . warfarin (COUMADIN) 5 MG tablet TAKE ONE TABLET BY MOUTH AS DIRECTED    Review of Systems  Constitutional: Negative for fever, chills, appetite change and fatigue.  Respiratory: Negative for chest tightness and shortness of breath.   Cardiovascular: Negative for chest pain and palpitations.  Gastrointestinal: Negative for nausea, vomiting and abdominal pain.  Neurological: Negative for dizziness and weakness.    Social History  Substance Use Topics  . Smoking status: Never Smoker   . Smokeless tobacco: Never Used  .  Alcohol Use: No   Objective:   BP 90/58 mmHg  Pulse 70  Temp(Src) 98.4 F (36.9 C) (Oral)  Resp 16  Ht 5\' 9"  (1.753 m)  Wt 203 lb (92.08 kg)  BMI 29.96 kg/m2  SpO2 98%  LMP 06/05/2016  Physical Exam  General Appearance:    Alert, cooperative, no distress, obese  Eyes:    PERRL, conjunctiva/corneas clear, EOM's intact       Lungs:     Clear to auscultation bilaterally, respirations unlabored  Heart:    Regular rate and rhythm  Neurologic:   Awake, alert, oriented x 3. No apparent focal neurological           defect.        Results for orders placed or performed in visit on 07/07/16  POCT INR  Result Value Ref Range   INR 3.2    PT 39.0        Assessment & Plan:     1. PE (pulmonary embolism) Reduce coumadin to 12.tmg daily and repeat PT/INR 3 weeks.  - warfarin (COUMADIN) 5 MG tablet; Take up to 3 tablets daily as directed  Dispense: 75 tablet; Refill: 5 - POCT INR; Standing - POCT INR     The entirety of the information documented in the History of Present Illness, Review of Systems and Physical Exam were personally obtained by me. Portions of this information were initially documented by April M. Hyacinth MeekerMiller, CMA and reviewed  by me for thoroughness and accuracy.    Lelon Huh, MD  [facility]

## 2016-07-07 NOTE — Patient Instructions (Signed)
Take 12.5 daily. Return in 3 weeks to recheck.

## 2016-07-21 ENCOUNTER — Encounter: Payer: Self-pay | Admitting: Family Medicine

## 2016-07-28 ENCOUNTER — Ambulatory Visit (INDEPENDENT_AMBULATORY_CARE_PROVIDER_SITE_OTHER): Payer: BLUE CROSS/BLUE SHIELD

## 2016-07-28 DIAGNOSIS — I2699 Other pulmonary embolism without acute cor pulmonale: Secondary | ICD-10-CM

## 2016-07-28 LAB — POCT INR
INR: 3.8
PT: 451

## 2016-07-28 NOTE — Patient Instructions (Signed)
Anticoagulation Dose Instructions as of 07/28/2016      Glynis SmilesSun Mon Tue Wed Thu Fri Sat   New Dose 10 mg 10 mg 10 mg 10 mg 10 mg 10 mg 10 mg    Description   Hold x 3 days then 10 mg daily, f/u in 2 weeks

## 2016-08-11 ENCOUNTER — Ambulatory Visit: Payer: Self-pay

## 2016-08-18 ENCOUNTER — Ambulatory Visit: Payer: Self-pay

## 2016-11-07 ENCOUNTER — Encounter: Payer: Self-pay | Admitting: Family Medicine

## 2016-11-07 ENCOUNTER — Ambulatory Visit (INDEPENDENT_AMBULATORY_CARE_PROVIDER_SITE_OTHER): Payer: BLUE CROSS/BLUE SHIELD | Admitting: Family Medicine

## 2016-11-07 VITALS — BP 100/70 | HR 68 | Temp 98.0°F | Resp 16 | Wt 213.0 lb

## 2016-11-07 DIAGNOSIS — H1032 Unspecified acute conjunctivitis, left eye: Secondary | ICD-10-CM

## 2016-11-07 MED ORDER — SULFACETAMIDE SODIUM 10 % OP SOLN: OPHTHALMIC | 0 refills | Status: DC

## 2016-11-07 NOTE — Progress Notes (Signed)
Subjective:     Patient ID: Kristen [name], feFilomena Junglingmale   DOB: 10/04/1971, 45 y.o.   MRN: [id number]  HPI  Chief Complaint  Patient presents with  . Eye Problem    Left eye. Started last night. Is red, irritated, is experiencing discharge, and eye was matted shut this morning. Denies allergy sx.  States her eye both itches and feels irritated. Reports eye was red this AM but has improved. No contact use of changes in vision reported. She works as a Cabin crewpecial Ed. Teacher.   Review of Systems     Objective:   Physical Exam  Constitutional: She appears well-developed and well-nourished. No distress.  Eyes:  Left sclera without injection or drainage. No f.b. Visualized. V.A. Intact to # of fingers.       Assessment:    1. Acute bacterial conjunctivitis of left eye - sulfacetamide (BLEPH-10) 10 % ophthalmic solution; 2 drops to left eye 4 x day  Dispense: 15 mL; Refill: 0    Plan:    Discussed frequent use of warm,wet compresses.

## 2016-11-07 NOTE — Patient Instructions (Signed)
Discussed use of wet, warm compresses several x day. 

## 2017-01-17 ENCOUNTER — Encounter: Payer: BLUE CROSS/BLUE SHIELD | Admitting: Obstetrics and Gynecology

## 2017-01-19 ENCOUNTER — Other Ambulatory Visit: Payer: Self-pay | Admitting: Family Medicine

## 2017-01-19 DIAGNOSIS — I2699 Other pulmonary embolism without acute cor pulmonale: Secondary | ICD-10-CM

## 2017-02-22 ENCOUNTER — Telehealth: Payer: Self-pay | Admitting: Family Medicine

## 2017-02-22 DIAGNOSIS — Z86711 Personal history of pulmonary embolism: Secondary | ICD-10-CM

## 2017-02-22 DIAGNOSIS — Z9229 Personal history of other drug therapy: Secondary | ICD-10-CM

## 2017-02-22 NOTE — Telephone Encounter (Signed)
Please review. Thanks!  

## 2017-02-22 NOTE — Telephone Encounter (Signed)
Please order PT/INR

## 2017-02-22 NOTE — Telephone Encounter (Signed)
Pt is requesting a lab slip to have her PT checked.  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  CB#[phone]/MW

## 2017-02-23 ENCOUNTER — Other Ambulatory Visit: Payer: Self-pay | Admitting: Family Medicine

## 2017-02-23 DIAGNOSIS — I2699 Other pulmonary embolism without acute cor pulmonale: Secondary | ICD-10-CM

## 2017-02-23 NOTE — Telephone Encounter (Signed)
Left message advising pt.   Thanks,   -Jden Want  

## 2017-02-24 LAB — PROTIME-INR
INR: 1.1 (ref 0.8–1.2)
PROTHROMBIN TIME: 11.8 s (ref 9.1–12.0)

## 2017-02-28 ENCOUNTER — Telehealth: Payer: Self-pay | Admitting: Family Medicine

## 2017-02-28 ENCOUNTER — Telehealth: Payer: Self-pay

## 2017-02-28 NOTE — Telephone Encounter (Signed)
-----   Message from Malva Limesonald E Fisher, MD sent at 02/26/2017  8:05 AM EDT ----- PT/INR is low, please check and verify that she has been taking warfarin, and what dose she has been taking.

## 2017-02-28 NOTE — Telephone Encounter (Signed)
Please increase to 15mg  MWF, and continue 10mg  all other days, recheck PT/INR in 2 weeks (prefer she have it done in office instead of [facility].... Can put her on my schedule for PT if she cant make it to a PT clinic)

## 2017-02-28 NOTE — Telephone Encounter (Signed)
Patient states she has been taking Warfarin 10 mg daily and never misses a dose.

## 2017-02-28 NOTE — Telephone Encounter (Signed)
Pt is returning call.  CB#[phone]/MW

## 2017-02-28 NOTE — Telephone Encounter (Signed)
Patient advised and verbally voiced understanding. Appointment for PT recheck scheduled for Tuesday, 03/13/2017 at 2:30. Patient may be a little late for this appointment (closer to 3pm).

## 2017-03-05 ENCOUNTER — Emergency Department: Payer: BLUE CROSS/BLUE SHIELD

## 2017-03-05 DIAGNOSIS — Z7901 Long term (current) use of anticoagulants: Secondary | ICD-10-CM | POA: Insufficient documentation

## 2017-03-05 DIAGNOSIS — M79661 Pain in right lower leg: Secondary | ICD-10-CM | POA: Diagnosis present

## 2017-03-05 DIAGNOSIS — Z79899 Other long term (current) drug therapy: Secondary | ICD-10-CM | POA: Insufficient documentation

## 2017-03-05 LAB — PROTIME-INR
INR: 1.22
Prothrombin Time: 15.5 seconds — ABNORMAL HIGH (ref 11.4–15.2)

## 2017-03-05 LAB — BASIC METABOLIC PANEL
Anion gap: 8 (ref 5–15)
BUN: 9 mg/dL (ref 6–20)
CALCIUM: 8.9 mg/dL (ref 8.9–10.3)
CO2: 24 mmol/L (ref 22–32)
CREATININE: 0.69 mg/dL (ref 0.44–1.00)
Chloride: 105 mmol/L (ref 101–111)
GFR calc Af Amer: >60 mL/min (ref >60)
Glucose, Bld: 153 mg/dL — ABNORMAL HIGH (ref 65–99)
Potassium: 3.2 mmol/L — ABNORMAL LOW (ref 3.5–5.1)
Sodium: 137 mmol/L (ref 135–145)

## 2017-03-05 LAB — CBC
HCT: 30.7 % — ABNORMAL LOW (ref 35.0–47.0)
Hemoglobin: 9.7 g/dL — ABNORMAL LOW (ref 12.0–16.0)
MCH: 22.2 pg — AB (ref 26.0–34.0)
MCHC: 31.5 g/dL — AB (ref 32.0–36.0)
MCV: 70.6 fL — ABNORMAL LOW (ref 80.0–100.0)
PLATELETS: 545 10*3/uL — AB (ref 150–440)
RBC: 4.35 MIL/uL (ref 3.80–5.20)
RDW: 18.4 % — AB (ref 11.5–14.5)
WBC: 10.7 10*3/uL (ref 3.6–11.0)

## 2017-03-05 LAB — TROPONIN I

## 2017-03-05 NOTE — ED Triage Notes (Addendum)
Pt in with co right calf pain and swelling since today. Pt is on coumadin had it adjusted last week because of INR 1.1. Pt is on coumadin for PE's and DVT's. Pt also co heart racing for about 2 week, no symptoms at this time.

## 2017-03-06 ENCOUNTER — Telehealth: Payer: Self-pay | Admitting: Family Medicine

## 2017-03-06 ENCOUNTER — Emergency Department
Admission: EM | Admit: 2017-03-06 | Discharge: 2017-03-06 | Disposition: A | Discharge status: Home / Self Care | Payer: BLUE CROSS/BLUE SHIELD | Attending: Emergency Medicine | Admitting: Emergency Medicine

## 2017-03-06 DIAGNOSIS — M79661 Pain in right lower leg: Secondary | ICD-10-CM

## 2017-03-06 LAB — CK: Total CK: 105 U/L (ref 38–234)

## 2017-03-06 MED ORDER — ENOXAPARIN SODIUM 100 MG/ML ~~LOC~~ SOLN
1.0000 mg/kg | Freq: Once | SUBCUTANEOUS | Status: AC
  Administered 2017-03-06: 100 mg via SUBCUTANEOUS
  Filled 2017-03-06: qty 1

## 2017-03-06 MED ORDER — ENOXAPARIN SODIUM 300 MG/3ML IJ SOLN: 1.0000 mg/kg | Freq: Two times a day (BID) | INTRAMUSCULAR | 0 refills | Status: DC

## 2017-03-06 MED ORDER — DIAZEPAM 2 MG PO TABS: 2.0000 mg | ORAL_TABLET | Freq: Three times a day (TID) | ORAL | 0 refills | Status: DC | PRN

## 2017-03-06 MED ORDER — POTASSIUM CHLORIDE CRYS ER 20 MEQ PO TBCR
40.0000 meq | EXTENDED_RELEASE_TABLET | Freq: Once | ORAL | Status: AC
  Administered 2017-03-06: 40 meq via ORAL
  Filled 2017-03-06: qty 2

## 2017-03-06 MED ORDER — DIAZEPAM 2 MG PO TABS
2.0000 mg | ORAL_TABLET | Freq: Once | ORAL | Status: AC
  Administered 2017-03-06: 2 mg via ORAL
  Filled 2017-03-06: qty 1

## 2017-03-06 NOTE — Discharge Instructions (Signed)
1. Take Lovenox injections twice daily x 5 days. Continue your current Coumadin dosage schedule. 2. You may take Valium as needed for muscle cramps. 3. Return to the ER for worsening symptoms, discoloration of your toes, chest pain, difficulty breathing or other concerns.

## 2017-03-06 NOTE — Telephone Encounter (Signed)
Pt advised. Appointment scheduled. Allene DillonEmily Drozdowski, CMA

## 2017-03-06 NOTE — Telephone Encounter (Signed)
Pt's INR at hospital yesterday was 1.22, and PT was 15.5. Pt's potassium was also low at 3.2. Please advise. Allene DillonEmily Drozdowski, CMA

## 2017-03-06 NOTE — ED Notes (Signed)

## 2017-03-06 NOTE — ED Provider Notes (Signed)
[facility] Emergency Department Provider Note   ____________________________________________   First MD Initiated Contact with Patient 03/06/17 0154     (approximate)  I have reviewed the triage vital signs and the nursing notes.   HISTORY  Chief Complaint Leg Pain    HPI Kristen [name] is a 46 y.o. female who presents to the ED from home with a chief complaint of right calf pain. Patient has a history of DVTs and PE with IVC filter, on Coumadin. Coumadin dose recently increased to 15 mg M/W/F alternating with 10 mg for INR 1.1.Noted pain in right calf and swelling yesterday. Currently taking prednisone for left tendinitis and thought she overused her right leg but given her history wanted to be evaluated for DVT. Also notes some spots to her right foot. Denies associated fever, chills, chest pain, shortness of breath, abdominal pain, nausea, vomiting, diarrhea. States she had palpitations 2 weeks ago without associated chest pain or shortness of breath. Denies recent travel or trauma. Nothing makes her symptoms better or worse.   Past Medical History:  Diagnosis Date  . Anxiety   . Bipolar disorder (HCC)   . Cholelithiasis   . DVT (deep venous thrombosis) (HCC)   . GERD (gastroesophageal reflux disease)   . Kidney stones   . Pulmonary embolism (HCC)   . RLS (restless legs syndrome)     Patient Active Problem List   Diagnosis Date Noted  . Sleep disorder 12/14/2015  . Allergic rhinitis, seasonal 05/20/2015  . Abnormal cells of cervix 05/20/2015  . Complication of surgery 05/20/2015  . Deep phlebitis-leg (HCC) 05/20/2015  . Fatty infiltration of liver 05/20/2015  . Bariatric surgery status 05/20/2015  . Iron deficiency 05/20/2015  . Personal history of PE (pulmonary embolism) 05/20/2015  . Restless leg 05/20/2015  . B12 deficiency 05/20/2015  . History of anticoagulant therapy 05/20/2015  . Abnormal blood sugar 12/06/2009  . Vitamin D  deficiency 10/01/2009  . Chronic nonalcoholic liver disease 08/27/2009  . Hypercholesteremia 10/08/2007  . Polycystic ovarian syndrome 02/14/2007  . OBESITY, NOS 02/14/2007  . BIPOLAR DISORDER 02/14/2007    Past Surgical History:  Procedure Laterality Date  . CERVICAL BIOPSY  W/ LOOP ELECTRODE EXCISION  2007  . CHOLECYSTECTOMY    . GASTRIC BYPASS    . HERNIA REPAIR    . kidney stone removal    . KNEE ARTHROSCOPY    . TONSILLECTOMY      Prior to Admission medications   Medication Sig Start Date End Date Taking? Authorizing Provider  ALPRAZolam Prudy Feeler) 0.5 MG tablet Take 0.5 mg by mouth See admin instructions. 0.5 mg  Three times daily and 1 mg at bedtime 10/21/14   Historical Provider, MD  Cholecalciferol (VITAMIN D) 2000 units tablet Take 2,000 Units by mouth daily.    Historical Provider, MD  lamoTRIgine (LAMICTAL) 200 MG tablet Take 200 mg by mouth daily.    Historical Provider, MD  levonorgestrel (MIRENA) 20 MCG/24HR IUD by Intrauterine route. 10/08/07   Historical Provider, MD  Lurasidone HCl 120 MG TABS Take 120 mg by mouth at bedtime.    Historical Provider, MD  sertraline (ZOLOFT) 50 MG tablet  01/01/16   Historical Provider, MD  sulfacetamide (BLEPH-10) 10 % ophthalmic solution 2 drops to left eye 4 x day 11/07/16   Anola Gurney, PA  Vitamin K, Phytonadione, 100 MCG TABS Take 100 mcg by mouth daily. Take with warfarin 04/28/15   Historical Provider, MD  warfarin (COUMADIN) 5 MG tablet  TAKE UP TO 3 TABLETS EVERY DAY AS DIRECTED 02/26/17   Malva Limes, MD    Allergies Codeine  Family History  Problem Relation Age of Onset  . Diabetes Father   . Stroke Father   . Heart attack Father   . Kidney failure Father   . Hypertension Father   . Uterine cancer Maternal Grandmother   . Ulcers Maternal Grandmother   . Diabetes Maternal Grandfather   . Stroke Maternal Grandfather   . Breast cancer Paternal Grandmother   . Colon cancer Paternal Grandfather   . Hypertension  Paternal Grandfather     Social History Social History  Substance Use Topics  . Smoking status: Never Smoker  . Smokeless tobacco: Never Used  . Alcohol use No    Review of Systems  Constitutional: No fever/chills. Eyes: No visual changes. ENT: No sore throat. Cardiovascular: Denies chest pain. Respiratory: Denies shortness of breath. Gastrointestinal: No abdominal pain.  No nausea, no vomiting.  No diarrhea.  No constipation. Genitourinary: Negative for dysuria. Musculoskeletal: Positive for right calf pain. Negative for back pain. Skin: Negative for rash. Neurological: Negative for headaches, focal weakness or numbness.  10-point ROS otherwise negative.  ____________________________________________   PHYSICAL EXAM:  VITAL SIGNS: ED Triage Vitals  Enc Vitals Group     BP 03/05/17 2136 140/87     Pulse Rate 03/05/17 2136 85     Resp 03/05/17 2136 18     Temp 03/05/17 2136 98.5 F (36.9 C)     Temp Source 03/05/17 2136 Oral     SpO2 03/05/17 2136 100 %     Weight 03/05/17 2137 225 lb (102.1 kg)     Height 03/05/17 2137 5\' 9"  (1.753 m)     Head Circumference --      Peak Flow --      Pain Score 03/05/17 2141 6     Pain Loc --      Pain Edu? --      Excl. in GC? --     Constitutional: Alert and oriented. Well appearing and in no acute distress. Eyes: Conjunctivae are normal. PERRL. EOMI. Head: Atraumatic. Nose: No congestion/rhinnorhea. Mouth/Throat: Mucous membranes are moist.  Oropharynx non-erythematous. Neck: No stridor.   Cardiovascular: Normal rate, regular rhythm. Grossly normal heart sounds.  Good peripheral circulation. Respiratory: Normal respiratory effort.  No retractions. Lungs CTAB. Gastrointestinal: Soft and nontender. No distention. No abdominal bruits. No CVA tenderness. Musculoskeletal:  Left calf: 40.5 cm Right calf: 41 cm. Supple calf, mildly tender to palpation, muscle spasm noted. Pain slightly worse on dorsiflexion. No pallor. 2+  femoral and distal pulses. Brisk, less than 5 second capillary refill. Symmetrically warm limb without evidence for ischemia. Scattered non-raised macules to the sole and inner right foot. There is no purplish discoloration to toes. Neurologic:  Normal speech and language. No gross focal neurologic deficits are appreciated. No gait instability. Skin:  Skin is warm, dry and intact. No rash noted. Psychiatric: Mood and affect are normal. Speech and behavior are normal.  ____________________________________________   LABS (all labs ordered are listed, but only abnormal results are displayed)  Labs Reviewed  CBC - Abnormal; Notable for the following:       Result Value   Hemoglobin 9.7 (*)    HCT 30.7 (*)    MCV 70.6 (*)    MCH 22.2 (*)    MCHC 31.5 (*)    RDW 18.4 (*)    Platelets 545 (*)    All other  components within normal limits  BASIC METABOLIC PANEL - Abnormal; Notable for the following:    Potassium 3.2 (*)    Glucose, Bld 153 (*)    All other components within normal limits  PROTIME-INR - Abnormal; Notable for the following:    Prothrombin Time 15.5 (*)    All other components within normal limits  TROPONIN I  CK   ____________________________________________  EKG  ED ECG REPORT I, SUNG,JADE J, the attending physician, personally viewed and interpreted this ECG.   Date: 03/06/2017  EKG Time: 2146  Rate: 82  Rhythm: normal EKG, normal sinus rhythm  Axis: Normal  Intervals:none  ST&T Change: Nonspecific  ____________________________________________  RADIOLOGY  RLE Doppler interpreted per Dr. Phill MyronMcClintock: No evidence of deep venous thrombosis. ____________________________________________   PROCEDURES  Procedure(s) performed: None  Procedures  Critical Care performed: No  ____________________________________________   INITIAL IMPRESSION / ASSESSMENT AND PLAN / ED COURSE  Pertinent labs & imaging results that were available during my care of the  patient were reviewed by me and considered in my medical decision making (see chart for details).  46 year old female with significant past medical history for DVTs and PE who presents with right calf pain. INR is subtherapeutic. No DVT on ultrasound. Muscle cramp noted. Will replete potassium, check CK and administer muscle relaxer. Patient does have some skin changes along her medial right foot; No discoloration to her toes. Consider but does not resemble blue toe syndrome at this time. Have spoken with vascular surgery PA to ensure close follow-up.  Clinical Course as of Mar 06 318  Tue Mar 06, 2017  0317 Updated patient of CK results. Pain improved after Valium. Discussed with patient and spouse; she is high risk for recurrent DVT, and with subtherapeutic INR, I think it is best to bridge her with a 5 day course of Lovenox sq bid. She is to continue her current regimen of Coumadin. I will send a message through Epic to her PCP Dr. Sherrie MustacheFisher recommending a repeat ultrasound in 2-3 days. She is currently scheduled for INR check in one week. Strict return precautions given. Patient and spouse verbalize understanding and agreed with plan of care.  [JS]    Clinical Course User Index [JS] Irean HongJade J Sung, MD     ____________________________________________   FINAL CLINICAL IMPRESSION(S) / ED DIAGNOSES  Final diagnoses:  Right calf pain      NEW MEDICATIONS STARTED DURING THIS VISIT:  New Prescriptions   No medications on file     Note:  This document was prepared using Dragon voice recognition software and may include unintentional dictation errors.    Irean HongJade J Sung, MD 03/06/17 [phone]

## 2017-03-06 NOTE — Telephone Encounter (Signed)
lovenox is a good idea to take for a few days. It would be best if she could get PT/INR checked at the end of this week. Can put on my schedule Friday afternoon.

## 2017-03-06 NOTE — Telephone Encounter (Signed)
Pt is requesting a nurse to return her call. Pt stated that she went to the ED last night b/c she thought she had a blood clot in her right leg. Pt stated that she had an ultrasound and was advised they didn't see a blood clot but that she should be rechecked by ultrasound within a couple days. Pt stated that she is also concerned that she was put on  enoxaparin (LOVENOX) 300 MG/3ML SOLN injection in addition to her warfarin (COUMADIN) 5 MG tablet b/c her blood was to thick. Pt would like to discuss this with a nurse. Please advise. Thanks TNP

## 2017-03-06 NOTE — ED Notes (Signed)
Pt c/o of swelling and pain in the right calf; pt has good pulses distally to the area of pain, and cap refill of <3 sec; no significant swelling noted as compared to the other extremity; pt states history of DVT and PE;

## 2017-03-09 ENCOUNTER — Encounter (INDEPENDENT_AMBULATORY_CARE_PROVIDER_SITE_OTHER): Payer: BLUE CROSS/BLUE SHIELD | Admitting: Vascular Surgery

## 2017-03-09 ENCOUNTER — Ambulatory Visit (INDEPENDENT_AMBULATORY_CARE_PROVIDER_SITE_OTHER): Payer: BLUE CROSS/BLUE SHIELD | Admitting: Family Medicine

## 2017-03-09 ENCOUNTER — Encounter: Payer: Self-pay | Admitting: Family Medicine

## 2017-03-09 VITALS — BP 108/72 | HR 88 | Temp 97.4°F | Resp 16 | Wt 194.0 lb

## 2017-03-09 DIAGNOSIS — D509 Iron deficiency anemia, unspecified: Secondary | ICD-10-CM | POA: Diagnosis not present

## 2017-03-09 DIAGNOSIS — R634 Abnormal weight loss: Secondary | ICD-10-CM | POA: Diagnosis not present

## 2017-03-09 DIAGNOSIS — R739 Hyperglycemia, unspecified: Secondary | ICD-10-CM | POA: Diagnosis not present

## 2017-03-09 DIAGNOSIS — I2699 Other pulmonary embolism without acute cor pulmonale: Secondary | ICD-10-CM

## 2017-03-09 DIAGNOSIS — E876 Hypokalemia: Secondary | ICD-10-CM | POA: Diagnosis not present

## 2017-03-09 DIAGNOSIS — Z800 Family history of malignant neoplasm of digestive organs: Secondary | ICD-10-CM | POA: Diagnosis not present

## 2017-03-09 LAB — POCT INR
INR: 1.3
PT: 15.8

## 2017-03-09 NOTE — Progress Notes (Signed)
Patient: Kristen [name] Female    DOB: Apr 21, 1971   46 y.o.   MRN: [id number] Visit Date: 03/09/2017  Today's Provider: Lelon Huh, MD   Chief Complaint  Patient presents with  . Follow-up   Subjective:    HPI  Follow up ER visit  Patient was seen in ER for right calf pain on 03/06/2017. Ultrasound was ordered and negative for DVT. Patient was started on 5 day course of Lovenox along with coumadin regiment. Patient was advised to follow up with PCP, and repeat ultrasound in 2-3 days She reports good compliance with treatment. She reports this condition is Improved.  She was noted to be mildly hypokalemic at ER and was given supplemental potassium, but not prescribed any additional potassium  She was also also noted to have microcytic anemia. She states she has not noticed any unusually bleeding or bruising. Still has regular menstruel cycles which are not unusually heavy or long. She does have family history colon cancer in her paternal grandfather.   She also reports weight loss over the last several months and that she has felt unusually thirsty and hungry al the time. She has not been doing anything in particular to lose weight. She does have strong family history of diabetes and borderline gestational diabetes history  ------------------------------------------------------------------------------------  Wt Readings from Last 3 Encounters:  03/09/17 194 lb (88 kg)  03/05/17 225 lb (102.1 kg)  11/07/16 213 lb (96.6 kg)    Office Visit on 03/09/2017  Component Date Value Ref Range Status  . INR 03/09/2017 1.3   Final  . PT 03/09/2017 15.8   Final  Admission on 03/06/2017, Discharged on 03/06/2017  Component Date Value Ref Range Status  . WBC 03/05/2017 10.7  3.6 - 11.0 K/uL Final  . RBC 03/05/2017 4.35  3.80 - 5.20 MIL/uL Final  . Hemoglobin 03/05/2017 9.7* 12.0 - 16.0 g/dL Final  . HCT 03/05/2017 30.7* 35.0 - 47.0 % Final  . MCV 03/05/2017 70.6* 80.0 - 100.0  fL Final  . MCH 03/05/2017 22.2* 26.0 - 34.0 pg Final  . MCHC 03/05/2017 31.5* 32.0 - 36.0 g/dL Final  . RDW 03/05/2017 18.4* 11.5 - 14.5 % Final  . Platelets 03/05/2017 545* 150 - 440 K/uL Final  . Sodium 03/05/2017 137  135 - 145 mmol/L Final  . Potassium 03/05/2017 3.2* 3.5 - 5.1 mmol/L Final  . Chloride 03/05/2017 105  101 - 111 mmol/L Final  . CO2 03/05/2017 24  22 - 32 mmol/L Final  . Glucose, Bld 03/05/2017 153* 65 - 99 mg/dL Final  . BUN 03/05/2017 9  6 - 20 mg/dL Final  . Creatinine, Ser 03/05/2017 0.69  0.44 - 1.00 mg/dL Final  . Calcium 03/05/2017 8.9  8.9 - 10.3 mg/dL Final  . GFR calc non Af Amer 03/05/2017 >60  >60 mL/min Final  . GFR calc Af Amer 03/05/2017 >60  >60 mL/min Final   Comment: (NOTE) The eGFR has been calculated using the CKD EPI equation. This calculation has not been validated in all clinical situations. eGFR's persistently <60 mL/min signify possible Chronic Kidney Disease.   . Anion gap 03/05/2017 8  5 - 15 Final  . Troponin I 03/05/2017 <0.03  <0.03 ng/mL Final  . Prothrombin Time 03/05/2017 15.5* 11.4 - 15.2 seconds Final  . INR 03/05/2017 1.22   Final  . Total CK 03/05/2017 105  38 - 234 U/L Final  Telephone on 02/22/2017  Component Date Value Ref Range Status  .  INR 02/23/2017 1.1  0.8 - 1.2 Final   Comment: Reference interval is for non-anticoagulated patients. Suggested INR therapeutic range for Vitamin K antagonist therapy:    Standard Dose (moderate intensity                   therapeutic range):       2.0 - 3.0    Higher intensity therapeutic range       2.5 - 3.5   . Prothrombin Time 02/23/2017 11.8  9.1 - 12.0 sec Final       Allergies  Allergen Reactions  . Codeine Nausea Only     Current Outpatient Prescriptions:  .  ALPRAZolam (XANAX) 0.5 MG tablet, Take 0.5 mg by mouth See admin instructions. 0.5 mg  Three times daily and 1 mg at bedtime, Disp: , Rfl: 0 .  Cholecalciferol (VITAMIN D) 2000 units tablet, Take 2,000 Units by  mouth daily., Disp: , Rfl:  .  diazepam (VALIUM) 2 MG tablet, Take 1 tablet (2 mg total) by mouth every 8 (eight) hours as needed for muscle spasms., Disp: 15 tablet, Rfl: 0 .  enoxaparin (LOVENOX) 300 MG/3ML SOLN injection, Inject 1.02 mLs (102 mg total) into the skin every 12 (twelve) hours., Disp: 10.2 mL, Rfl: 0 .  lamoTRIgine (LAMICTAL) 200 MG tablet, Take 200 mg by mouth daily., Disp: , Rfl:  .  levonorgestrel (MIRENA) 20 MCG/24HR IUD, by Intrauterine route., Disp: , Rfl:  .  Vitamin K, Phytonadione, 100 MCG TABS, Take 100 mcg by mouth daily. Take with warfarin, Disp: , Rfl:  .  warfarin (COUMADIN) 5 MG tablet, TAKE UP TO 3 TABLETS EVERY DAY AS DIRECTED, Disp: 75 tablet, Rfl: 0 .  Oxcarbazepine (TRILEPTAL) 300 MG tablet, Take 2 tablets by mouth daily., Disp: , Rfl: 0 .  VRAYLAR 3 MG CAPS, Take 1 capsule by mouth daily., Disp: , Rfl: 0  Review of Systems  Constitutional: Negative for appetite change, chills, fatigue and fever.  Respiratory: Negative for chest tightness and shortness of breath.   Cardiovascular: Negative for chest pain and palpitations.  Gastrointestinal: Negative for abdominal pain, nausea and vomiting.  Musculoskeletal: Positive for myalgias (right calf pain).  Neurological: Negative for dizziness and weakness.    Social History  Substance Use Topics  . Smoking status: Never Smoker  . Smokeless tobacco: Never Used  . Alcohol use No   Objective:   BP 108/72 (BP Location: Left Arm, Patient Position: Sitting, Cuff Size: Large)   Pulse 88   Temp 97.4 F (36.3 C) (Oral)   Resp 16   Wt 194 lb (88 kg)   LMP 02/19/2017 (Exact Date)   SpO2 96% Comment: room air  BMI 28.65 kg/m  There were no vitals filed for this visit.   Physical Exam   General Appearance:    Alert, cooperative, no distress  Eyes:    PERRL, conjunctiva/corneas clear, EOM's intact       Lungs:     Clear to auscultation bilaterally, respirations unlabored  Heart:    Regular rate and rhythm    Neurologic:   Awake, alert, oriented x 3. No apparent focal neurological           defect.           Assessment & Plan:     1. Pulmonary embolism (HCC) Subtherapeutic INR. Will increase to 43m daily warfarin - POCT INR  2. Iron deficiency anemia, unspecified iron deficiency anemia type - CBC - Ferritin - Vitamin B12 -  Folate - Retic  3. Abnormal weight loss  - Comprehensive metabolic panel - T4 AND TSH  4. Hyperglycemia  - Comprehensive metabolic panel - Hemoglobin A1c  5. Family history of colon cancer   6. Hypokalemia Likely cause of her leg pain which has resolved since given potassium replacement in ER.        Lelon Huh, MD  [facility]

## 2017-03-09 NOTE — Patient Instructions (Addendum)
Anticoagulation Dose Instructions as of 03/09/2017      Kristen SmilesSun Mon Tue Wed Thu Fri Sat   New Dose 15 mg 15 mg 15 mg 15 mg 15 mg 15 mg 15 mg    Description   Dx: Pulmonary Embolism ( ICD- 10  I26.99) Current dose: 10mg  daily, except 15mg  on Mon, Wed, Fri PT: 15.8 INR: 1.3 Changes made today: change to 15mg  daily Recheck: 1 week

## 2017-03-10 ENCOUNTER — Telehealth: Payer: Self-pay

## 2017-03-10 ENCOUNTER — Other Ambulatory Visit: Payer: Self-pay | Admitting: Family Medicine

## 2017-03-10 DIAGNOSIS — D509 Iron deficiency anemia, unspecified: Secondary | ICD-10-CM

## 2017-03-10 DIAGNOSIS — R634 Abnormal weight loss: Secondary | ICD-10-CM | POA: Insufficient documentation

## 2017-03-10 DIAGNOSIS — Z800 Family history of malignant neoplasm of digestive organs: Secondary | ICD-10-CM

## 2017-03-10 LAB — CBC
Hematocrit: 34.4 % (ref 34.0–46.6)
Hemoglobin: 10.2 g/dL — ABNORMAL LOW (ref 11.1–15.9)
MCH: 21.7 pg — AB (ref 26.6–33.0)
MCHC: 29.7 g/dL — AB (ref 31.5–35.7)
MCV: 73 fL — ABNORMAL LOW (ref 79–97)
Platelets: 533 10*3/uL — ABNORMAL HIGH (ref 150–379)
RBC: 4.7 x10E6/uL (ref 3.77–5.28)
RDW: 17.8 % — AB (ref 12.3–15.4)
WBC: 9.9 10*3/uL (ref 3.4–10.8)

## 2017-03-10 LAB — T4 AND TSH
T4 TOTAL: 6.7 ug/dL (ref 4.5–12.0)
TSH: 0.502 u[IU]/mL (ref 0.450–4.500)

## 2017-03-10 LAB — COMPREHENSIVE METABOLIC PANEL
ALK PHOS: 86 IU/L (ref 39–117)
ALT: 15 IU/L (ref 0–32)
AST: 24 IU/L (ref 0–40)
Albumin/Globulin Ratio: 1.5 (ref 1.2–2.2)
Albumin: 4.1 g/dL (ref 3.5–5.5)
BUN / CREAT RATIO: 11 (ref 9–23)
BUN: 8 mg/dL (ref 6–24)
Bilirubin Total: 0.2 mg/dL (ref 0.0–1.2)
CHLORIDE: 100 mmol/L (ref 96–106)
CO2: 22 mmol/L (ref 18–29)
Calcium: 9.1 mg/dL (ref 8.7–10.2)
Creatinine, Ser: 0.7 mg/dL (ref 0.57–1.00)
GFR calc Af Amer: 121 mL/min/{1.73_m2} (ref >59)
GFR calc non Af Amer: 105 mL/min/{1.73_m2} (ref >59)
GLUCOSE: 91 mg/dL (ref 65–99)
Globulin, Total: 2.7 g/dL (ref 1.5–4.5)
Potassium: 4 mmol/L (ref 3.5–5.2)
Sodium: 140 mmol/L (ref 134–144)
Total Protein: 6.8 g/dL (ref 6.0–8.5)

## 2017-03-10 LAB — VITAMIN B12: Vitamin B-12: 170 pg/mL — ABNORMAL LOW (ref 232–1245)

## 2017-03-10 LAB — HEMOGLOBIN A1C
ESTIMATED AVERAGE GLUCOSE: 126 mg/dL
Hgb A1c MFr Bld: 6 % — ABNORMAL HIGH (ref 4.8–5.6)

## 2017-03-10 LAB — FOLATE: Folate: 5.4 ng/mL (ref >3.0)

## 2017-03-10 LAB — RETICULOCYTES: RETIC CT PCT: 1.3 % (ref 0.6–2.6)

## 2017-03-10 LAB — FERRITIN: Ferritin: 10 ng/mL — ABNORMAL LOW (ref 15–150)

## 2017-03-10 NOTE — Telephone Encounter (Signed)
-----   Message from Malva Limesonald E Fisher, MD sent at 03/10/2017  9:13 AM EDT ----- Patient has very low iron and vitamin b12 levels. Sugar is slightly higher than normal, but not in diabetic range. Need to start OTC iron sulfate 325mg  twice a day, OTC vitamin B12 1000mg  every day.  Also needs referral to GI for iron deficiency anemia and family history of colon cancer.   Need to recheck CBC, B12, and ferritin level in 1 month.

## 2017-03-10 NOTE — Telephone Encounter (Signed)
Patient advised as directed below.  Gi referral placed.  Thanks,  -Zeev Deakins

## 2017-03-13 ENCOUNTER — Ambulatory Visit: Payer: BLUE CROSS/BLUE SHIELD | Admitting: Family Medicine

## 2017-03-16 ENCOUNTER — Ambulatory Visit (INDEPENDENT_AMBULATORY_CARE_PROVIDER_SITE_OTHER): Payer: BLUE CROSS/BLUE SHIELD | Admitting: *Deleted

## 2017-03-16 DIAGNOSIS — I2699 Other pulmonary embolism without acute cor pulmonale: Secondary | ICD-10-CM | POA: Diagnosis not present

## 2017-03-16 LAB — POCT INR
INR: 1.5
PT: 17.9

## 2017-03-16 NOTE — Patient Instructions (Signed)
New Dose 15 mg 15 mg 15 mg 15 mg 15 mg 15 mg 15 mg    Description   Dx: Pulmonary Embolism ( ICD- 10  I26.99) Current dose: 10mg daily, except 15mg on Mon, Wed, Fri PT: 15.8 INR: 1.3 Changes made today: none Recheck: 1 week   

## 2017-03-16 NOTE — Progress Notes (Signed)
New Dose 15 mg 15 mg 15 mg 15 mg 15 mg 15 mg 15 mg    Description   Dx: Pulmonary Embolism ( ICD- 10  I26.99) Current dose:  daily, except  on Mon, Wed, Fri PT: 15.8 INR: 1.3 Changes made today: none Recheck: 1 week

## 2017-03-23 ENCOUNTER — Ambulatory Visit (INDEPENDENT_AMBULATORY_CARE_PROVIDER_SITE_OTHER): Payer: BLUE CROSS/BLUE SHIELD

## 2017-03-23 ENCOUNTER — Other Ambulatory Visit: Payer: Self-pay | Admitting: Family Medicine

## 2017-03-23 DIAGNOSIS — I2699 Other pulmonary embolism without acute cor pulmonale: Secondary | ICD-10-CM | POA: Diagnosis not present

## 2017-03-23 LAB — POCT INR
INR: 1.8
PT: 21.8

## 2017-03-23 NOTE — Patient Instructions (Signed)
Anticoagulation Dose Instructions as of 03/23/2017      Glynis Smiles Tue Wed Thu Fri Sat   New Dose 15 mg 15 mg 15 mg 15 mg 15 mg 15 mg 15 mg    Description   Continue 15 mg daily. Recheck 1 week.

## 2017-03-28 ENCOUNTER — Other Ambulatory Visit
Admission: RE | Admit: 2017-03-28 | Discharge: 2017-03-28 | Disposition: A | Discharge status: Home / Self Care | Payer: BLUE CROSS/BLUE SHIELD | Source: Ambulatory Visit | Attending: Gastroenterology | Admitting: Gastroenterology

## 2017-03-28 ENCOUNTER — Telehealth: Payer: Self-pay | Admitting: Gastroenterology

## 2017-03-28 ENCOUNTER — Ambulatory Visit (INDEPENDENT_AMBULATORY_CARE_PROVIDER_SITE_OTHER): Payer: BLUE CROSS/BLUE SHIELD | Admitting: Gastroenterology

## 2017-03-28 ENCOUNTER — Telehealth: Payer: Self-pay

## 2017-03-28 ENCOUNTER — Other Ambulatory Visit: Payer: Self-pay

## 2017-03-28 ENCOUNTER — Encounter: Payer: Self-pay | Admitting: Gastroenterology

## 2017-03-28 VITALS — BP 105/71 | HR 80 | Temp 97.8°F | Ht 69.0 in | Wt 196.0 lb

## 2017-03-28 DIAGNOSIS — D649 Anemia, unspecified: Secondary | ICD-10-CM

## 2017-03-28 DIAGNOSIS — E538 Deficiency of other specified B group vitamins: Secondary | ICD-10-CM

## 2017-03-28 DIAGNOSIS — D509 Iron deficiency anemia, unspecified: Secondary | ICD-10-CM | POA: Diagnosis not present

## 2017-03-28 LAB — URINALYSIS, COMPLETE (UACMP) WITH MICROSCOPIC
Bilirubin Urine: NEGATIVE
GLUCOSE, UA: NEGATIVE mg/dL
Ketones, ur: NEGATIVE mg/dL
Nitrite: NEGATIVE
PH: 6 (ref 5.0–8.0)
PROTEIN: NEGATIVE mg/dL
Specific Gravity, Urine: 1.015 (ref 1.005–1.030)

## 2017-03-28 NOTE — Telephone Encounter (Signed)
Gastroenterology Pre-Procedure Review  Request Date: 4/17 Requesting Physician: Dr. Tobi Bastos   PATIENT REVIEW QUESTIONS: The patient responded to the following health history questions as indicated:    1. Are you having any GI issues? yes (anemia) 2. Do you have a personal history of Polyps? no 3. Do you have a family history of Colon Cancer or Polyps? yes (pgf ) 4. Diabetes Mellitus? no 5. Joint replacements in the past 12 months?no 6. Major health problems in the past 3 months?no 7. Any artificial heart valves, MVP, or defibrillator?no    MEDICATIONS & ALLERGIES:    Patient reports the following regarding taking any anticoagulation/antiplatelet therapy:   Plavix, Coumadin, Eliquis, Xarelto, Lovenox, Pradaxa, Brilinta, or Effient? yes (warfarin) Aspirin? no  Patient confirms/reports the following medications:  Current Outpatient Prescriptions  Medication Sig Dispense Refill  . ALPRAZolam (XANAX) 0.5 MG tablet Take 0.5 mg by mouth See admin instructions. 0.5 mg  Three times daily and 1 mg at bedtime  0  . amantadine (SYMMETREL) 100 MG capsule   0  . Cholecalciferol (VITAMIN D) 2000 units tablet Take 2,000 Units by mouth daily.    . diazepam (VALIUM) 2 MG tablet Take 1 tablet (2 mg total) by mouth every 8 (eight) hours as needed for muscle spasms. (Patient not taking: Reported on 03/28/2017) 15 tablet 0  . enoxaparin (LOVENOX) 300 MG/3ML SOLN injection Inject 1.02 mLs (102 mg total) into the skin every 12 (twelve) hours. 10.2 mL 0  . lamoTRIgine (LAMICTAL) 200 MG tablet Take 200 mg by mouth daily.    Marland Kitchen levonorgestrel (MIRENA) 20 MCG/24HR IUD by Intrauterine route.    . Oxcarbazepine (TRILEPTAL) 300 MG tablet Take 2 tablets by mouth daily.  0  . risperidone (RISPERDAL) 4 MG tablet   0  . Vitamin K, Phytonadione, 100 MCG TABS Take 100 mcg by mouth daily. Take with warfarin    . VRAYLAR 3 MG CAPS Take 1 capsule by mouth daily.  0  . warfarin (COUMADIN) 5 MG tablet TAKE UP TO 3 TABLETS BY  MOUTH DAILY AS DIRECTED 75 tablet 4   No current facility-administered medications for this visit.     Patient confirms/reports the following allergies:  Allergies  Allergen Reactions  . Codeine Nausea Only    No orders of the defined types were placed in this encounter.   AUTHORIZATION INFORMATION Primary Insurance: 1D#: Group #:  Secondary Insurance: 1D#: Group #:  SCHEDULE INFORMATION: Date: 03/31/17 Time: Location:  [facility]

## 2017-03-28 NOTE — Progress Notes (Signed)
Gastroenterology Consultation  Referring Provider:     Malva Limes, MD Primary Care Physician:  Mila Merry, MD Primary Gastroenterologist:  Dr. Wyline Mood  Reason for Consultation:     Iron deficiency anemia and weight loss         HPI:   Kristen [name] is a 46 y.o. y/o female referred for consultation & management  by Dr. Mila Merry, MD.     She has been referred for iron deficiency anemia. Also had some weight loss.Since 10/2016 lost 20 lbs. Hb 02/2017 9.7 grams with a MCV of 70 . Low B12 > 1 year, ferritin 10  She has had a recent pulmonary embolism . Hb 1 year back was 10.7 grams with MCV 84. She has been on iron for a year and still anemic. Was on two tablets a day . She started noticing her weight loss in the last 1 month, no change in diet, feels hungry, normal physical activity. Not depressed.   Rectal bleeding: None Nose bleeds: No  Vaginal bleeding : every month cycle, each lasts 3-4 days, changes 3 pads each day  Hematemesis or hemoptysis : no  Blood in urine : none   Paternal grandfather had colon cancer. She has never had a colonoscopy .   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   Past Medical History:  Diagnosis Date  . Anxiety   . Bipolar disorder (HCC)   . Cholelithiasis   . DVT (deep venous thrombosis) (HCC)   . GERD (gastroesophageal reflux disease)   . Kidney stones   . Pulmonary embolism (HCC)   . RLS (restless legs syndrome)     Past Surgical History:  Procedure Laterality Date  . CERVICAL BIOPSY  W/ LOOP ELECTRODE EXCISION  2007  . CHOLECYSTECTOMY    . GASTRIC BYPASS    . HERNIA REPAIR    . kidney stone removal    . KNEE ARTHROSCOPY    . TONSILLECTOMY      Prior to Admission medications   Medication Sig Start Date End Date Taking? Authorizing Provider  ALPRAZolam Prudy Feeler) 0.5 MG tablet Take 0.5 mg by mouth See admin instructions. 0.5 mg  Three times daily and 1 mg at bedtime  10/21/14  Yes Historical Provider, MD  Cholecalciferol (VITAMIN D) 2000 units tablet Take 2,000 Units by mouth daily.   Yes Historical Provider, MD  lamoTRIgine (LAMICTAL) 200 MG tablet Take 200 mg by mouth daily.   Yes Historical Provider, MD  levonorgestrel (MIRENA) 20 MCG/24HR IUD by Intrauterine route. 10/08/07  Yes Historical Provider, MD  Oxcarbazepine (TRILEPTAL) 300 MG tablet Take 2 tablets by mouth daily. 02/19/17  Yes Historical Provider, MD  Vitamin K, Phytonadione, 100 MCG TABS Take 100 mcg by mouth daily. Take with warfarin 04/28/15  Yes Historical Provider, MD  VRAYLAR 3 MG CAPS Take 1 capsule by mouth daily. 03/07/17  Yes Historical Provider, MD  warfarin (COUMADIN) 5 MG tablet TAKE UP TO 3 TABLETS BY MOUTH DAILY AS DIRECTED 03/25/17  Yes Malva Limes, MD  amantadine (SYMMETREL) 100 MG capsule  03/23/17   Historical Provider, MD  diazepam (VALIUM) 2 MG tablet Take 1 tablet (2 mg total) by mouth every 8 (eight) hours as needed for muscle spasms. Patient not taking: Reported on 03/28/2017 03/06/17   Irean Hong, MD  enoxaparin (LOVENOX) 300 MG/3ML SOLN injection Inject 1.02 mLs (102 mg total) into the skin every  12 (twelve) hours. 03/06/17 03/11/17  Irean Hong, MD  risperidone (RISPERDAL) 4 MG tablet  02/21/17   Historical Provider, MD    Family History  Problem Relation Age of Onset  . Diabetes Father   . Stroke Father   . Heart attack Father   . Kidney failure Father   . Hypertension Father   . Uterine cancer Maternal Grandmother   . Ulcers Maternal Grandmother   . Diabetes Maternal Grandfather   . Stroke Maternal Grandfather   . Breast cancer Paternal Grandmother   . Colon cancer Paternal Grandfather   . Hypertension Paternal Grandfather      Social History  Substance Use Topics  . Smoking status: Never Smoker  . Smokeless tobacco: Never Used  . Alcohol use No    Allergies as of 03/28/2017 - Review Complete 03/28/2017  Allergen Reaction Noted  . Codeine Nausea Only  07/09/2014    Review of Systems:    All systems reviewed and negative except where noted in HPI.   Physical Exam:  BP 105/71 (BP Location: Left Arm, Patient Position: Sitting, Cuff Size: Large)   Pulse 80   Temp 97.8 F (36.6 C) (Oral)   Ht  (1.753 m)   Wt 196 lb (88.9 kg)   BMI 28.94 kg/m  No LMP recorded. Psych:  Alert and cooperative. Normal mood and affect. General:   Alert,  Well-developed, well-nourished, pleasant and cooperative in NAD Head:  Normocephalic and atraumatic. Eyes:  Sclera clear, no icterus.   Conjunctiva pink. Ears:  Normal auditory acuity. Nose:  No deformity, discharge, or lesions. Mouth:  No deformity or lesions,oropharynx pink & moist. Neck:  Supple; no masses or thyromegaly. Lungs:  Respirations even and unlabored.  Clear throughout to auscultation.   No wheezes, crackles, or rhonchi. No acute distress. Heart:  Regular rate and rhythm; no murmurs, clicks, rubs, or gallops. Abdomen:  Normal bowel sounds.  No bruits.  Soft, non-tender and non-distended without masses, hepatosplenomegaly or hernias noted.  No guarding or rebound tenderness.    Msk:  Symmetrical without gross deformities. Good, equal movement & strength bilaterally. Pulses:  Normal pulses noted. Extremities:  No clubbing or edema.  No cyanosis. Neurologic:  Alert and oriented x3;  grossly normal neurologically. Skin:  Intact without significant lesions or rashes. No jaundice. Lymph Nodes:  No significant cervical adenopathy. Psych:  Alert and cooperative. Normal mood and affect.  Imaging Studies: US Venous Img Lower Unilateral Right  Result Date: 03/06/2017 CLINICAL DATA:  Initial evaluation for acute right calf pain for 1 day. History of DVT, PE. EXAM: Right LOWER EXTREMITY VENOUS DOPPLER ULTRASOUND TECHNIQUE: Gray-scale sonography with graded compression, as well as color Doppler and duplex ultrasound were performed to evaluate the lower extremity deep venous systems from the level  of the common femoral vein and including the common femoral, femoral, profunda femoral, popliteal and calf veins including the posterior tibial, peroneal and gastrocnemius veins when visible. The superficial great saphenous vein was also interrogated. Spectral Doppler was utilized to evaluate flow at rest and with distal augmentation maneuvers in the common femoral, femoral and popliteal veins. COMPARISON:  None. FINDINGS: Contralateral Common Femoral Vein: Respiratory phasicity is normal and symmetric with the symptomatic side. No evidence of thrombus. Normal compressibility. Common Femoral Vein: No evidence of thrombus. Normal compressibility, respiratory phasicity and response to augmentation. Saphenofemoral Junction: No evidence of thrombus. Normal compressibility and flow on color Doppler imaging. Profunda Femoral Vein: No evidence of thrombus. Normal compressibility and flow on  color Doppler imaging. Femoral Vein: No evidence of thrombus. Normal compressibility, respiratory phasicity and response to augmentation. Popliteal Vein: No evidence of thrombus. Normal compressibility, respiratory phasicity and response to augmentation. Calf Veins: No evidence of thrombus. Normal compressibility and flow on color Doppler imaging. Right peroneal vein not visualized. Superficial Great Saphenous Vein: No evidence of thrombus. Normal compressibility and flow on color Doppler imaging. Venous Reflux:  None. Other Findings:  None. IMPRESSION: No evidence of deep venous thrombosis. Electronically Signed   By: Rise Mu M.D.   On: 03/06/2017 00:26    Assessment and Plan:   Kristen [name] is a 46 y.o. y/o female has been referred for iron deficiency anemia and weight loss.She also has b12 deficiency .   Plan   1. Iron deficiency anemia : oral iron, not improving her counts and will need IV iron  2. 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 Check urine analysis, celiac serology  4. EGD+ colonoscopy and if negative will need capsule study of the small bowel  5. Need coumadin holding instructions and clearance as she had a recent pulmonary embolism  6. If endoscopy is negative then will need CT chest/abdomen/pelvis to evaluate for weight loss  I have discussed alternative options, risks & benefits,  which include, but are not limited to, bleeding, infection, perforation,respiratory complication & drug reaction.  The patient agrees with this plan & written consent will be obtained.    Dr Wyline Mood MD  F/u in 8 weeks

## 2017-03-28 NOTE — Telephone Encounter (Signed)
03/28/17 2:26 pm Spoke with Maryanna Shape at [facility] and NO prior auth required for colonoscopy for Iron def anemia [id number].

## 2017-03-29 ENCOUNTER — Telehealth: Payer: Self-pay

## 2017-03-29 ENCOUNTER — Telehealth: Payer: Self-pay | Admitting: Family Medicine

## 2017-03-29 LAB — INTRINSIC FACTOR ANTIBODIES: INTRINSIC FACTOR: 1.1 [AU]/ml (ref 0.0–1.1)

## 2017-03-29 NOTE — Telephone Encounter (Signed)
Advised Kristen [name] to stop Warfarin 4/12 per Dr. Sherrie Mustache. Pt to resume meds directly following procedure.  Doc scanned.

## 2017-03-29 NOTE — Telephone Encounter (Signed)
Pt

## 2017-03-30 ENCOUNTER — Other Ambulatory Visit: Payer: Self-pay

## 2017-03-30 ENCOUNTER — Ambulatory Visit: Payer: Self-pay | Admitting: Family Medicine

## 2017-03-30 DIAGNOSIS — D509 Iron deficiency anemia, unspecified: Secondary | ICD-10-CM

## 2017-03-30 LAB — CELIAC DISEASE PANEL
Endomysial Ab, IgA: NEGATIVE
IGA: 141 mg/dL (ref 87–352)
Tissue Transglutaminase Ab, IgA: <2 U/mL (ref 0–3)

## 2017-03-30 MED ORDER — PEG 3350-KCL-NA BICARB-NACL 420 G PO SOLR: 4000.0000 mL | Freq: Once | ORAL | 0 refills | Status: AC

## 2017-04-03 ENCOUNTER — Ambulatory Visit
Admission: RE | Admit: 2017-04-03 | Discharge: 2017-04-03 | Disposition: A | Discharge status: Home / Self Care | Payer: BLUE CROSS/BLUE SHIELD | Source: Ambulatory Visit | Attending: Gastroenterology | Admitting: Gastroenterology

## 2017-04-03 ENCOUNTER — Telehealth: Payer: Self-pay

## 2017-04-03 ENCOUNTER — Ambulatory Visit: Payer: BLUE CROSS/BLUE SHIELD | Admitting: Certified Registered Nurse Anesthetist

## 2017-04-03 ENCOUNTER — Encounter
Admission: RE | Disposition: A | Discharge status: Home / Self Care | Payer: Self-pay | Source: Ambulatory Visit | Attending: Gastroenterology

## 2017-04-03 DIAGNOSIS — K317 Polyp of stomach and duodenum: Secondary | ICD-10-CM | POA: Diagnosis not present

## 2017-04-03 DIAGNOSIS — K3189 Other diseases of stomach and duodenum: Secondary | ICD-10-CM | POA: Insufficient documentation

## 2017-04-03 DIAGNOSIS — F419 Anxiety disorder, unspecified: Secondary | ICD-10-CM | POA: Diagnosis not present

## 2017-04-03 DIAGNOSIS — D649 Anemia, unspecified: Secondary | ICD-10-CM

## 2017-04-03 DIAGNOSIS — Z86711 Personal history of pulmonary embolism: Secondary | ICD-10-CM | POA: Insufficient documentation

## 2017-04-03 DIAGNOSIS — D509 Iron deficiency anemia, unspecified: Secondary | ICD-10-CM | POA: Insufficient documentation

## 2017-04-03 DIAGNOSIS — Z86718 Personal history of other venous thrombosis and embolism: Secondary | ICD-10-CM | POA: Diagnosis not present

## 2017-04-03 DIAGNOSIS — Z7901 Long term (current) use of anticoagulants: Secondary | ICD-10-CM | POA: Diagnosis not present

## 2017-04-03 DIAGNOSIS — K640 First degree hemorrhoids: Secondary | ICD-10-CM | POA: Insufficient documentation

## 2017-04-03 DIAGNOSIS — Z79899 Other long term (current) drug therapy: Secondary | ICD-10-CM | POA: Insufficient documentation

## 2017-04-03 DIAGNOSIS — K6389 Other specified diseases of intestine: Secondary | ICD-10-CM | POA: Diagnosis not present

## 2017-04-03 DIAGNOSIS — Z793 Long term (current) use of hormonal contraceptives: Secondary | ICD-10-CM | POA: Diagnosis not present

## 2017-04-03 DIAGNOSIS — Z9884 Bariatric surgery status: Secondary | ICD-10-CM | POA: Diagnosis not present

## 2017-04-03 DIAGNOSIS — F319 Bipolar disorder, unspecified: Secondary | ICD-10-CM | POA: Diagnosis not present

## 2017-04-03 HISTORY — PX: ESOPHAGOGASTRODUODENOSCOPY (EGD) WITH PROPOFOL: SHX5813

## 2017-04-03 HISTORY — PX: COLONOSCOPY WITH PROPOFOL: SHX5780

## 2017-04-03 LAB — POCT PREGNANCY, URINE: Preg Test, Ur: NEGATIVE

## 2017-04-03 SURGERY — ESOPHAGOGASTRODUODENOSCOPY (EGD) WITH PROPOFOL
Anesthesia: General

## 2017-04-03 MED ORDER — LIDOCAINE HCL (PF) 2 % IJ SOLN
INTRAMUSCULAR | Status: AC
  Filled 2017-04-03: qty 2

## 2017-04-03 MED ORDER — FENTANYL CITRATE (PF) 100 MCG/2ML IJ SOLN
INTRAMUSCULAR | Status: AC
  Filled 2017-04-03: qty 2

## 2017-04-03 MED ORDER — SODIUM CHLORIDE 0.9 % IV SOLN
INTRAVENOUS | Status: DC
  Administered 2017-04-03: 11:00:00 via INTRAVENOUS

## 2017-04-03 MED ORDER — PROPOFOL 500 MG/50ML IV EMUL
INTRAVENOUS | Status: DC | PRN
  Administered 2017-04-03: 140 ug/kg/min via INTRAVENOUS

## 2017-04-03 MED ORDER — PHENYLEPHRINE HCL 10 MG/ML IJ SOLN
INTRAMUSCULAR | Status: DC | PRN
  Administered 2017-04-03: 50 ug via INTRAVENOUS
  Administered 2017-04-03: 100 ug via INTRAVENOUS
  Administered 2017-04-03: 50 ug via INTRAVENOUS

## 2017-04-03 MED ORDER — PROPOFOL 10 MG/ML IV BOLUS
INTRAVENOUS | Status: DC | PRN
  Administered 2017-04-03: 80 mg via INTRAVENOUS

## 2017-04-03 MED ORDER — PROPOFOL 500 MG/50ML IV EMUL
INTRAVENOUS | Status: AC
  Filled 2017-04-03: qty 50

## 2017-04-03 MED ORDER — GLYCOPYRROLATE 0.2 MG/ML IJ SOLN
INTRAMUSCULAR | Status: DC | PRN
  Administered 2017-04-03: 0.2 mg via INTRAVENOUS

## 2017-04-03 MED ORDER — LIDOCAINE HCL (CARDIAC) 20 MG/ML IV SOLN
INTRAVENOUS | Status: DC | PRN
  Administered 2017-04-03: 90 mg via INTRAVENOUS

## 2017-04-03 MED ORDER — FENTANYL CITRATE (PF) 100 MCG/2ML IJ SOLN
INTRAMUSCULAR | Status: DC | PRN
  Administered 2017-04-03 (×2): 50 ug via INTRAVENOUS

## 2017-04-03 NOTE — Anesthesia Post-op Follow-up Note (Cosign Needed)
Anesthesia QCDR form completed.        

## 2017-04-03 NOTE — Anesthesia Preprocedure Evaluation (Signed)
Anesthesia Evaluation  Patient identified by MRN, date of birth, ID band Patient awake    Reviewed: Allergy & Precautions, H&P , NPO status , Patient's Chart, lab work & pertinent test results, reviewed documented beta blocker date and time   Airway Mallampati: II   Neck ROM: full    Dental  (+) Poor Dentition   Pulmonary neg pulmonary ROS,    Pulmonary exam normal        Cardiovascular Exercise Tolerance: Good + Peripheral Vascular Disease  negative cardio ROS Normal cardiovascular exam Rhythm:regular Rate:Normal     Neuro/Psych PSYCHIATRIC DISORDERS negative neurological ROS  negative psych ROS   GI/Hepatic negative GI ROS, Neg liver ROS, GERD  Medicated,  Endo/Other  negative endocrine ROS  Renal/GU Renal diseasenegative Renal ROS  negative genitourinary   Musculoskeletal   Abdominal   Peds  Hematology negative hematology ROS (+) anemia ,   Anesthesia Other Findings Past Medical History: No date: Anxiety No date: Bipolar disorder (HCC) No date: Cholelithiasis No date: DVT (deep venous thrombosis) (HCC) No date: GERD (gastroesophageal reflux disease) No date: Kidney stones No date: Pulmonary embolism (HCC) No date: RLS (restless legs syndrome) Past Surgical History: 2007: CERVICAL BIOPSY  W/ LOOP ELECTRODE EXCISION No date: CHOLECYSTECTOMY No date: GASTRIC BYPASS No date: HERNIA REPAIR No date: kidney stone removal No date: KNEE ARTHROSCOPY No date: TONSILLECTOMY   Reproductive/Obstetrics negative OB ROS                             Anesthesia Physical Anesthesia Plan  ASA: III  Anesthesia Plan: General   Post-op Pain Management:    Induction:   Airway Management Planned:   Additional Equipment:   Intra-op Plan:   Post-operative Plan:   Informed Consent: I have reviewed the patients History and Physical, chart, labs and discussed the procedure including the  risks, benefits and alternatives for the proposed anesthesia with the patient or authorized representative who has indicated his/her understanding and acceptance.   Dental Advisory Given  Plan Discussed with: CRNA  Anesthesia Plan Comments:         Anesthesia Quick Evaluation

## 2017-04-03 NOTE — H&P (Signed)
Wyline Mood MD [address] [place], [place] [address] Phone: [phone] Fax : [phone]  Primary Care Physician:  Mila Merry, MD Primary Gastroenterologist:  Dr. Wyline Mood   Pre-Procedure History & Physical: HPI:  Kristen [name] is a 46 y.o. female is here for an endoscopy and colonoscopy.   Past Medical History:  Diagnosis Date  . Anxiety   . Bipolar disorder (HCC)   . Cholelithiasis   . DVT (deep venous thrombosis) (HCC)   . GERD (gastroesophageal reflux disease)   . Kidney stones   . Pulmonary embolism (HCC)   . RLS (restless legs syndrome)     Past Surgical History:  Procedure Laterality Date  . CERVICAL BIOPSY  W/ LOOP ELECTRODE EXCISION  2007  . CHOLECYSTECTOMY    . GASTRIC BYPASS    . HERNIA REPAIR    . kidney stone removal    . KNEE ARTHROSCOPY    . TONSILLECTOMY      Prior to Admission medications   Medication Sig Start Date End Date Taking? Authorizing Provider  ALPRAZolam Prudy Feeler) 0.5 MG tablet Take 0.5 mg by mouth See admin instructions. 0.5 mg  Three times daily and 1 mg at bedtime 10/21/14  Yes Historical Provider, MD  Cholecalciferol (VITAMIN D) 2000 units tablet Take 2,000 Units by mouth daily.   Yes Historical Provider, MD  lamoTRIgine (LAMICTAL) 200 MG tablet Take 200 mg by mouth daily.   Yes Historical Provider, MD  Oxcarbazepine (TRILEPTAL) 300 MG tablet Take 2 tablets by mouth daily. 02/19/17  Yes Historical Provider, MD  VRAYLAR 3 MG CAPS Take 1 capsule by mouth daily. 03/07/17  Yes Historical Provider, MD  amantadine (SYMMETREL) 100 MG capsule  03/23/17   Historical Provider, MD  diazepam (VALIUM) 2 MG tablet Take 1 tablet (2 mg total) by mouth every 8 (eight) hours as needed for muscle spasms. Patient not taking: Reported on 03/28/2017 03/06/17   Irean Hong, MD  enoxaparin (LOVENOX) 300 MG/3ML SOLN injection Inject 1.02 mLs (102 mg total) into the skin every 12 (twelve) hours. 03/06/17 03/11/17  Irean Hong, MD  levonorgestrel (MIRENA)  20 MCG/24HR IUD by Intrauterine route. 10/08/07   Historical Provider, MD  risperidone (RISPERDAL) 4 MG tablet  02/21/17   Historical Provider, MD  Vitamin K, Phytonadione, 100 MCG TABS Take 100 mcg by mouth daily. Take with warfarin 04/28/15   Historical Provider, MD  warfarin (COUMADIN) 5 MG tablet TAKE UP TO 3 TABLETS BY MOUTH DAILY AS DIRECTED 03/25/17   Malva Limes, MD    Allergies as of 03/28/2017 - Review Complete 03/28/2017  Allergen Reaction Noted  . Codeine Nausea Only 07/09/2014    Family History  Problem Relation Age of Onset  . Diabetes Father   . Stroke Father   . Heart attack Father   . Kidney failure Father   . Hypertension Father   . Uterine cancer Maternal Grandmother   . Ulcers Maternal Grandmother   . Diabetes Maternal Grandfather   . Stroke Maternal Grandfather   . Breast cancer Paternal Grandmother   . Colon cancer Paternal Grandfather   . Hypertension Paternal Grandfather     Social History   Social History  . Marital status: Married    Spouse name: N/A  . Number of children: 3  . Years of education: N/A   Occupational History  . 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 Smoking status: Never Smoker  . Smokeless tobacco: Never Used  .  Alcohol use No  . Drug use: No  . Sexual activity: Yes    Partners: Male    Birth control/ protection: IUD   Other Topics Concern  . Not on file   Social History Narrative  . No narrative on file    Review of Systems: See HPI, otherwise negative ROS  Physical Exam: BP 117/74   Pulse 81   Temp (!) 96.6 F (35.9 C) (Oral)   Resp 16   Ht  (1.753 m)   Wt 196 lb (88.9 kg)   SpO2 100%   BMI 28.94 kg/m  General:   Alert,  pleasant and cooperative in NAD Head:  Normocephalic and atraumatic. Neck:  Supple; no masses or thyromegaly. Lungs:  Clear throughout to auscultation.    Heart:  Regular rate and rhythm. Abdomen:  Soft, nontender and nondistended. Normal  bowel sounds, without guarding, and without rebound.   Neurologic:  Alert and  oriented x4;  grossly normal neurologically.  Impression/Plan: Kristen [name] is here for an endoscopy and colonoscopy to be performed for iron deficiency anemia   Risks, benefits, limitations, and alternatives regarding  endoscopy and colonoscopy have been reviewed with the patient.  Questions have been answered.  All parties agreeable.   Wyline Mood, MD  04/03/2017, 11:23 AM

## 2017-04-03 NOTE — Op Note (Signed)
[facility] Patient Name: Kristen [name] Procedure Date: 04/03/2017 11:26 AM MRN: [id number] Account #: [id number] Date of Birth: October 24, 1971 Admit Type: Outpatient Age: 46 Room: [facility] ENDO ROOM 3 Gender: Female Note Status: Finalized Procedure:            Upper GI endoscopy Indications:          Iron deficiency anemia Providers:            Wyline Mood MD, MD Referring MD:         Demetrios Isaacs. Sherrie Mustache, MD (Referring MD) Medicines:            Monitored Anesthesia Care Complications:        No immediate complications. Procedure:            Pre-Anesthesia Assessment:                       - Prior to the procedure, a History and Physical was                        performed, and patient medications, allergies and                        sensitivities were reviewed. The patient's tolerance of                        previous anesthesia was reviewed.                       - The risks and benefits of the procedure and the                        sedation options and risks were discussed with the                        patient. All questions were answered and informed                        consent was obtained.                       - ASA Grade Assessment: III - A patient with severe                        systemic disease.                       After obtaining informed consent, the endoscope was                        passed under direct vision. Throughout the procedure,                        the patient's blood pressure, pulse, and oxygen                        saturations were monitored continuously. The Endoscope                        was introduced through the mouth, and advanced to the  jejunum. The upper GI endoscopy was accomplished with                        ease. The patient tolerated the procedure well. Findings:      The esophagus was normal.      The examined jejunum was normal.      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 This was characterized by healthy appearing mucosa.      One 7 mm sessile polyp with no bleeding and no stigmata of recent       bleeding was found in the stomach. The polyp was removed with a cold       biopsy forceps. Polyp resection was incomplete. The resected tissue was       retrieved. Impression:           - Normal esophagus.                       - Normal examined jejunum.                       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                       - One gastric polyp. Incomplete resection. Resected                        tissue retrieved. Recommendation:       - Await pathology results.                       - Perform a colonoscopy today. Procedure Code(s):    --- Professional ---                       [phone], Esophagogastroduodenoscopy, flexible, transoral;                        with biopsy, single or multiple Diagnosis Code(s):    --- Professional ---                       Z98.84, Bariatric surgery status                       K31.7, Polyp of stomach and duodenum                       D50.9, Iron deficiency anemia, unspecified CPT copyright 2016 American Medical Association. All rights reserved. The codes documented in this report are preliminary and upon coder review may  be revised to meet current compliance requirements. Wyline Mood, MD Wyline Mood MD, MD 04/03/2017 11:38:20 AM This report has been signed electronically. Number of Addenda: 0 Note Initiated On: 04/03/2017 11:26 AM      [facility]

## 2017-04-03 NOTE — Transfer of Care (Signed)
Immediate Anesthesia Transfer of Care Note  Patient: Kristen [name]  Procedure(s) Performed: Procedure(s): ESOPHAGOGASTRODUODENOSCOPY (EGD) WITH PROPOFOL (N/A) COLONOSCOPY WITH PROPOFOL (N/A)  Patient Location: PACU  Anesthesia Type:General  Level of Consciousness: sedated  Airway & Oxygen Therapy: Patient Spontanous Breathing and Patient connected to nasal cannula oxygen  Post-op Assessment: Report given to RN and Post -op Vital signs reviewed and stable  Post vital signs: Reviewed and stable  Last Vitals:  Vitals:   04/03/17 1058  BP: 117/74  Pulse: 81  Resp: 16  Temp: (!) 35.9 C    Last Pain:  Vitals:   04/03/17 1058  TempSrc: Oral         Complications: No apparent anesthesia complications

## 2017-04-03 NOTE — Op Note (Signed)
[facility] Patient Name: Kristen [name] Procedure Date: 04/03/2017 11:25 AM MRN: [id number] Account #: [id number] Date of Birth: July 16, 1971 Admit Type: Outpatient Age: 46 Room: [facility] ENDO ROOM 3 Gender: Female Note Status: Finalized Procedure:            Colonoscopy Indications:          Iron deficiency anemia Providers:            Wyline Mood MD, MD Referring MD:         Demetrios Isaacs. Sherrie Mustache, MD (Referring MD) Medicines:            Monitored Anesthesia Care Complications:        No immediate complications. Procedure:            Pre-Anesthesia Assessment:                       - Prior to the procedure, a History and Physical was                        performed, and patient medications, allergies and                        sensitivities were reviewed. The patient's tolerance of                        previous anesthesia was reviewed.                       - The risks and benefits of the procedure and the                        sedation options and risks were discussed with the                        patient. All questions were answered and informed                        consent was obtained.                       - ASA Grade Assessment: III - A patient with severe                        systemic disease.                       After obtaining informed consent, the colonoscope was                        passed under direct vision. Throughout the procedure,                        the patient's blood pressure, pulse, and oxygen                        saturations were monitored continuously. The Olympus                        CF-H180AL colonoscope ( S#: [id number] ) was introduced  through the anus and advanced to the the terminal                        ileum. The colonoscopy was performed with ease. The                        patient tolerated the procedure well. The quality of                        the bowel preparation was  good. Findings:      The perianal and digital rectal examinations were normal.      The terminal ileum appeared normal.      Non-bleeding internal hemorrhoids were found during retroflexion. The       hemorrhoids were small and Grade I (internal hemorrhoids that do not       prolapse).      The ileocecal valve was moderately lipomatous. Positive "pilllow sign"       on probing with forceps      The exam was otherwise without abnormality on direct and retroflexion       views. Impression:           - The examined portion of the ileum was normal.                       - Non-bleeding internal hemorrhoids.                       - Lipomatous ileocecal valve.                       - The examination was otherwise normal on direct and                        retroflexion views.                       - No specimens collected. Recommendation:       - Discharge patient to home (with escort).                       - Resume previous diet.                       - Continue present medications.                       - Suggest capsule study of the small bowel after a                        small bowel follow through to rule out strictures due                        to prior abdominal surgery. Procedure Code(s):    --- Professional ---                       [phone], Colonoscopy, flexible; diagnostic, including                        collection of specimen(s) by brushing or washing, when  performed (separate procedure) Diagnosis Code(s):    --- Professional ---                       K64.0, First degree hemorrhoids                       K63.89, Other specified diseases of intestine                       D50.9, Iron deficiency anemia, unspecified CPT copyright 2016 American Medical Association. All rights reserved. The codes documented in this report are preliminary and upon coder review may  be revised to meet current compliance requirements. Wyline Mood, MD Wyline Mood MD,  MD 04/03/2017 11:58:04 AM This report has been signed electronically. Number of Addenda: 0 Note Initiated On: 04/03/2017 11:25 AM Scope Withdrawal Time: 0 hours 11 minutes 26 seconds  Total Procedure Duration: 0 hours 14 minutes 48 seconds       [employer]

## 2017-04-03 NOTE — Telephone Encounter (Signed)
LVM for patient callback for lab results.  Normal labs, per Dr. Tobi Bastos.

## 2017-04-03 NOTE — Telephone Encounter (Signed)
-----   Message from Wyline Mood, MD sent at 04/02/2017 10:05 AM EDT ----- Celiac serology and IF antibody normal

## 2017-04-04 ENCOUNTER — Encounter: Payer: Self-pay | Admitting: Gastroenterology

## 2017-04-04 LAB — SURGICAL PATHOLOGY

## 2017-04-04 NOTE — Anesthesia Postprocedure Evaluation (Signed)
Anesthesia Post Note  Patient: THURSA EMME  Procedure(s) Performed: Procedure(s) (LRB): ESOPHAGOGASTRODUODENOSCOPY (EGD) WITH PROPOFOL (N/A) COLONOSCOPY WITH PROPOFOL (N/A)  Patient location during evaluation: PACU Anesthesia Type: General Level of consciousness: awake and alert and oriented Pain management: pain level controlled Vital Signs Assessment: post-procedure vital signs reviewed and stable Respiratory status: spontaneous breathing Cardiovascular status: blood pressure returned to baseline Anesthetic complications: no     Last Vitals:  Vitals:   04/03/17 1229 04/03/17 1239  BP: 99/66 (!) 102/59  Pulse: (!) 59 (!) 58  Resp: 15 (!) 25  Temp:      Last Pain:  Vitals:   04/04/17 0736  TempSrc:   PainSc: 0-No pain                 Romaine Maciolek

## 2017-04-10 ENCOUNTER — Ambulatory Visit (INDEPENDENT_AMBULATORY_CARE_PROVIDER_SITE_OTHER): Payer: BLUE CROSS/BLUE SHIELD | Admitting: Family Medicine

## 2017-04-10 DIAGNOSIS — I2699 Other pulmonary embolism without acute cor pulmonale: Secondary | ICD-10-CM | POA: Diagnosis not present

## 2017-04-10 LAB — POCT INR
INR: 1.5
PT: 18.2

## 2017-04-10 NOTE — Patient Instructions (Signed)
Take 20 mg Monday, Wednesday, and Friday. Continue 15 mg all other days. Return for INR check in 2 weeks.

## 2017-04-23 ENCOUNTER — Inpatient Hospital Stay: Payer: BLUE CROSS/BLUE SHIELD

## 2017-04-23 ENCOUNTER — Inpatient Hospital Stay: Payer: BLUE CROSS/BLUE SHIELD | Attending: Oncology | Admitting: Oncology

## 2017-04-23 ENCOUNTER — Encounter: Payer: Self-pay | Admitting: Oncology

## 2017-04-23 VITALS — BP 103/72 | HR 81 | Temp 98.7°F | Ht 69.0 in | Wt 194.8 lb

## 2017-04-23 DIAGNOSIS — D508 Other iron deficiency anemias: Secondary | ICD-10-CM | POA: Diagnosis not present

## 2017-04-23 DIAGNOSIS — Z808 Family history of malignant neoplasm of other organs or systems: Secondary | ICD-10-CM

## 2017-04-23 DIAGNOSIS — K760 Fatty (change of) liver, not elsewhere classified: Secondary | ICD-10-CM

## 2017-04-23 DIAGNOSIS — E7800 Pure hypercholesterolemia, unspecified: Secondary | ICD-10-CM | POA: Diagnosis not present

## 2017-04-23 DIAGNOSIS — Z9884 Bariatric surgery status: Secondary | ICD-10-CM | POA: Insufficient documentation

## 2017-04-23 DIAGNOSIS — I82501 Chronic embolism and thrombosis of unspecified deep veins of right lower extremity: Secondary | ICD-10-CM

## 2017-04-23 DIAGNOSIS — Z7901 Long term (current) use of anticoagulants: Secondary | ICD-10-CM

## 2017-04-23 DIAGNOSIS — Z800 Family history of malignant neoplasm of digestive organs: Secondary | ICD-10-CM | POA: Diagnosis not present

## 2017-04-23 DIAGNOSIS — Z86718 Personal history of other venous thrombosis and embolism: Secondary | ICD-10-CM | POA: Diagnosis not present

## 2017-04-23 DIAGNOSIS — K219 Gastro-esophageal reflux disease without esophagitis: Secondary | ICD-10-CM | POA: Diagnosis not present

## 2017-04-23 DIAGNOSIS — Z86711 Personal history of pulmonary embolism: Secondary | ICD-10-CM

## 2017-04-23 DIAGNOSIS — E538 Deficiency of other specified B group vitamins: Secondary | ICD-10-CM | POA: Diagnosis not present

## 2017-04-23 DIAGNOSIS — D509 Iron deficiency anemia, unspecified: Secondary | ICD-10-CM

## 2017-04-23 DIAGNOSIS — Z803 Family history of malignant neoplasm of breast: Secondary | ICD-10-CM | POA: Diagnosis not present

## 2017-04-23 DIAGNOSIS — R634 Abnormal weight loss: Secondary | ICD-10-CM | POA: Diagnosis not present

## 2017-04-23 DIAGNOSIS — Z79899 Other long term (current) drug therapy: Secondary | ICD-10-CM | POA: Diagnosis not present

## 2017-04-23 DIAGNOSIS — I2782 Chronic pulmonary embolism: Secondary | ICD-10-CM

## 2017-04-23 LAB — IRON AND TIBC
IRON: 197 ug/dL — AB (ref 28–170)
Saturation Ratios: 46 % — ABNORMAL HIGH (ref 10.4–31.8)
TIBC: 431 ug/dL (ref 250–450)
UIBC: 234 ug/dL

## 2017-04-23 LAB — CBC WITH DIFFERENTIAL/PLATELET
BASOS PCT: 1 %
Basophils Absolute: 0.1 10*3/uL (ref 0–0.1)
Eosinophils Absolute: 0.1 10*3/uL (ref 0–0.7)
Eosinophils Relative: 2 %
HEMATOCRIT: 39.3 % (ref 35.0–47.0)
HEMOGLOBIN: 12.6 g/dL (ref 12.0–16.0)
LYMPHS PCT: 16 %
Lymphs Abs: 1.2 10*3/uL (ref 1.0–3.6)
MCH: 25.3 pg — ABNORMAL LOW (ref 26.0–34.0)
MCHC: 31.9 g/dL — AB (ref 32.0–36.0)
MCV: 79.3 fL — AB (ref 80.0–100.0)
MONOS PCT: 9 %
Monocytes Absolute: 0.6 10*3/uL (ref 0.2–0.9)
NEUTROS ABS: 5.4 10*3/uL (ref 1.4–6.5)
NEUTROS PCT: 72 %
Platelets: 417 10*3/uL (ref 150–440)
RBC: 4.96 MIL/uL (ref 3.80–5.20)
RDW: 25.6 % — ABNORMAL HIGH (ref 11.5–14.5)
WBC: 7.4 10*3/uL (ref 3.6–11.0)

## 2017-04-23 LAB — FERRITIN: Ferritin: 11 ng/mL (ref 11–307)

## 2017-04-23 LAB — VITAMIN B12: VITAMIN B 12: 277 pg/mL (ref 180–914)

## 2017-04-23 LAB — FOLATE: Folate: 11.5 ng/mL (ref >5.9)

## 2017-04-23 NOTE — Progress Notes (Signed)
Patient here for anemia. She has no pain or discomfort, no complaints of dyspnea, headache.. Appetite and sleep pattern WNL.

## 2017-04-23 NOTE — Progress Notes (Signed)
Hematology/Oncology Consult note [facility] [phone] [phone]  Patient Care Team: Malva LimesFisher, Donald E, MD as PCP - General (Family Medicine) Cottle, Steva Readyarey G Jr., MD (Psychiatry)   Name of the patient: Kristen [name]  [id number]  08/07/1971    Reason for referral- iron deficiency anemia   Referring physician- Dr. Wyline MoodKiran Anna  Date of visit: 04/23/17   History of presenting illness- patient is a 46 year old female with a history of gastric bypass surgery in 2010. Prior to that she had a right lower extremity DVT in 2006 and was on Coumadin briefly. Prior gastric bypass surgery she had IVC filter placement. 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 There were attempts made to retrieve her IVC filter but that could not be done. She has also had a hypercoagulable workup in the past which did not reveal any cause for her DVT and PE. Currently patient is on 20 mg alternating with 15 mg of Coumadin every day but has had problems with INR control over the last couple of months.  She was seen by Dr. Tobi BastosAnna for iron deficiency anemia. CBC on 03/09/2017 showed white count of 9.9, H&H of 10.2/34.4 with an MCV of 73 and a platelet count of 533. CMP and TSH was within normal limits. Ferritin was low at 10. B12 was low at 117. Folate was normal. Celiac disease panel was negative. Intrinsic factor antibody was within normal limits. Urinalysis did not reveal any evidence of hematuria. Patient underwent EGD and colonoscopy which did not reveal any evidence of bleeding. There was evidence of gastric bypass. Patient has not had a capsule endoscopy yet.   Patient does report that her weight has been waxing and waning. At the time of gastric bypass surgery she was about 260 pounds and went down to around 150 pounds post surgery. She then went up to 225 pounds a few months ago but since January 2018 she reports some  unintentional weight loss and is down to 194 lb currently. Other than that she is doing well and denies any complaints    ECOG PS- 0  Pain scale- 0   Review of systems- Review of Systems  Constitutional: Negative for chills, fever, malaise/fatigue and weight loss.  HENT: Negative for congestion, ear discharge and nosebleeds.   Eyes: Negative for blurred vision.  Respiratory: Negative for cough, hemoptysis, sputum production, shortness of breath and wheezing.   Cardiovascular: Negative for chest pain, palpitations, orthopnea and claudication.  Gastrointestinal: Negative for abdominal pain, blood in stool, constipation, diarrhea, heartburn, melena, nausea and vomiting.  Genitourinary: Negative for dysuria, flank pain, frequency, hematuria and urgency.  Musculoskeletal: Negative for back pain, joint pain and myalgias.  Skin: Negative for rash.  Neurological: Negative for dizziness, tingling, focal weakness, seizures, weakness and headaches.  Endo/Heme/Allergies: Does not bruise/bleed easily.  Psychiatric/Behavioral: Negative for depression and suicidal ideas. The patient does not have insomnia.     Allergies  Allergen Reactions  . Codeine Nausea Only    Patient Active Problem List   Diagnosis Date Noted  . Iron deficiency anemia 03/10/2017  . Unexplained weight loss 03/10/2017  . Family history of colon cancer 03/09/2017  . Hypokalemia 03/09/2017  . Sleep disorder 12/14/2015  . Allergic rhinitis, seasonal 05/20/2015  . Abnormal cells of cervix 05/20/2015  . Complication of surgery 05/20/2015  . Deep phlebitis-leg (HCC) 05/20/2015  . Fatty infiltration of liver 05/20/2015  . Bariatric surgery status 05/20/2015  .  Iron deficiency 05/20/2015  . Personal history of PE (pulmonary embolism) 05/20/2015  . Restless leg 05/20/2015  . B12 deficiency 05/20/2015  . History of anticoagulant therapy 05/20/2015  . Abnormal blood sugar 12/06/2009  . Vitamin D deficiency 10/01/2009  .  Chronic nonalcoholic liver disease 08/27/2009  . Hypercholesteremia 10/08/2007  . Polycystic ovarian syndrome 02/14/2007  . OBESITY, NOS 02/14/2007  . BIPOLAR DISORDER 02/14/2007     Past Medical History:  Diagnosis Date  . Anemia   . Anxiety   . Bipolar disorder (HCC)   . Cholelithiasis   . DVT (deep venous thrombosis) (HCC)   . GERD (gastroesophageal reflux disease)   . Kidney stones   . Pulmonary embolism (HCC)   . RLS (restless legs syndrome)      Past Surgical History:  Procedure Laterality Date  . CERVICAL BIOPSY  W/ LOOP ELECTRODE EXCISION  2007  . CHOLECYSTECTOMY    . COLONOSCOPY WITH PROPOFOL N/A 04/03/2017   Procedure: COLONOSCOPY WITH PROPOFOL;  Surgeon: Wyline Mood, MD;  Location[facility] ENDOSCOPY;  Service: Endoscopy;  Laterality: N/A;  . ESOPHAGOGASTRODUODENOSCOPY (EGD) WITH PROPOFOL N/A 04/03/2017   Procedure: ESOPHAGOGASTRODUODENOSCOPY (EGD) WITH PROPOFOL;  Surgeon: Wyline Mood, MD;  Location: [facility] ENDOSCOPY;  Service: Endoscopy;  Laterality: N/A;  . GASTRIC BYPASS    . HERNIA REPAIR    . kidney stone removal    . KNEE ARTHROSCOPY    . TONSILLECTOMY      Social History   Social History  . Marital status: Married    Spouse name: N/A  . Number of children: 3  . Years of education: N/A   Occupational History  . 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 Smoking status: Never Smoker  . Smokeless tobacco: Never Used  . Alcohol use No  . Drug use: No  . Sexual activity: Yes    Partners: Male    Birth control/ protection: IUD   Other Topics Concern  . Not on file   Social History Narrative  . No narrative on file     Family History  Problem Relation Age of Onset  . Diabetes Father   . Stroke Father   . Heart attack Father   . Kidney failure Father   . Hypertension Father   . Uterine cancer Maternal Grandmother   . Ulcers Maternal Grandmother   . Diabetes Maternal Grandfather   . Stroke Maternal Grandfather    . Breast cancer Paternal Grandmother   . Colon cancer Paternal Grandfather   . Hypertension Paternal Grandfather      Current Outpatient Prescriptions:  .  ALPRAZolam (XANAX) 0.5 MG tablet, Take 0.5 mg by mouth See admin instructions. 0.5 mg  Three times daily and 1 mg at bedtime, Disp: , Rfl: 0 .  ferrous sulfate 325 (65 FE) MG tablet, Take 325 mg by mouth daily with breakfast., Disp: , Rfl:  .  lamoTRIgine (LAMICTAL) 200 MG tablet, Take 200 mg by mouth daily., Disp: , Rfl:  .  levonorgestrel (MIRENA) 20 MCG/24HR IUD, by Intrauterine route., Disp: , Rfl:  .  Oxcarbazepine (TRILEPTAL) 300 MG tablet, Take 2 tablets by mouth daily., Disp: , Rfl: 0 .  vitamin B-12 (CYANOCOBALAMIN) 1000 MCG tablet, Take 1,000 mcg by mouth daily., Disp: , Rfl:  .  Vitamin K, Phytonadione, 100 MCG TABS, Take 100 mcg by mouth daily. Take with warfarin, Disp: , Rfl:  .  VRAYLAR 3 MG CAPS, Take 1 capsule by mouth daily., Disp: , Rfl:  0 .  warfarin (COUMADIN) 5 MG tablet, TAKE UP TO 3 TABLETS BY MOUTH DAILY AS DIRECTED, Disp: 75 tablet, Rfl: 4   Physical exam:  Vitals:   04/23/17 1105  BP: 103/72  Pulse: 81  Temp: 98.7 F (37.1 C)  TempSrc: Tympanic  Weight: 194 lb 12.4 oz (88.3 kg)  Height: 5\' 9"  (1.753 m)   Physical Exam  Constitutional: She is oriented to person, place, and time and well-developed, well-nourished, and in no distress.  HENT:  Head: Normocephalic and atraumatic.  Eyes: EOM are normal. Pupils are equal, round, and reactive to light.  Neck: Normal range of motion.  Cardiovascular: Normal rate, regular rhythm and normal heart sounds.   Pulmonary/Chest: Effort normal and breath sounds normal.  Abdominal: Soft. Bowel sounds are normal.  Neurological: She is alert and oriented to person, place, and time.  Skin: Skin is warm and dry.       CMP Latest Ref Rng & Units 03/09/2017  Glucose 65 - 99 mg/dL 91  BUN 6 - 24 mg/dL 8  Creatinine 6.96 - 2.95 mg/dL 2.84  Sodium 132 - 440 mmol/L  140  Potassium 3.5 - 5.2 mmol/L 4.0  Chloride 96 - 106 mmol/L 100  CO2 18 - 29 mmol/L 22  Calcium 8.7 - 10.2 mg/dL 9.1  Total Protein 6.0 - 8.5 g/dL 6.8  Total Bilirubin 0.0 - 1.2 mg/dL 0.2  Alkaline Phos 39 - 117 IU/L 86  AST 0 - 40 IU/L 24  ALT 0 - 32 IU/L 15   CBC Latest Ref Rng & Units 03/09/2017  WBC 3.4 - 10.8 x10E3/uL 9.9  Hemoglobin 12.0 - 16.0 g/dL -  Hematocrit 10.2 - 72.5 % 34.4  Platelets 150 - 379 x10E3/uL 533(H)     Assessment and plan- Patient is a 46 y.o. female referred to su for iron deficiency anemia and B12 deficiency likely secondary to gastric bypass  Iron deficiency anemia- I will repeat cbc, b12 and iron studies, copper and zinc today. Patient has been on oral iron in the past and that has not helped her anemia. 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 I will plan to give her 2 doses of feraheme 510 mg IV weekly and repeat iron studies in 2 months. GI to look into if capsule endoscopy needed given h/o gastric bypass  B12 deficiency- she is on 1000 mcg daily B12 daily for 2 months now. Will check levels today and if low will switch to monthly B12 injections  h/o DVT/PE- patient has been having issues with coumadin and I have suggested that NOAC such as xarleto/eliquis (they do not have fda approved reversal agent) or pradaxa (given BID but has reversal agent) could be considered by pcp. She should remain on lifelong anticoagulation if possible  Weight loss- waxing and waning. Monitor for now. Consider CT scans if she continues to have weight loss     Thank you for this kind referral and the opportunity to participate in the care of this patient   Visit Diagnosis 1. Iron deficiency anemia, unspecified iron deficiency anemia type   2. B12 deficiency   3. Chronic deep vein thrombosis (DVT) of right lower extremity, unspecified vein (HCC)   4. Other chronic pulmonary embolism without acute cor pulmonale (HCC)     Dr. Owens Shark, MD, MPH [facility] at  [facility] Pager- [phone] 04/23/2017 12:02 PM

## 2017-04-24 ENCOUNTER — Ambulatory Visit (INDEPENDENT_AMBULATORY_CARE_PROVIDER_SITE_OTHER): Payer: BLUE CROSS/BLUE SHIELD | Admitting: Family Medicine

## 2017-04-24 ENCOUNTER — Encounter: Payer: Self-pay | Admitting: Family Medicine

## 2017-04-24 DIAGNOSIS — I2699 Other pulmonary embolism without acute cor pulmonale: Secondary | ICD-10-CM

## 2017-04-24 LAB — POCT INR
INR: 2
PT: 24.4

## 2017-04-24 LAB — ZINC: Zinc: 62 ug/dL (ref 56–134)

## 2017-04-24 LAB — COPPER, SERUM: COPPER: 105 ug/dL (ref 72–166)

## 2017-04-24 NOTE — Patient Instructions (Signed)
Take 20 mg Monday, Wednesday, and Friday. Continue 15 mg all other days. Return for INR check in 4 weeks.

## 2017-04-27 ENCOUNTER — Inpatient Hospital Stay: Payer: BLUE CROSS/BLUE SHIELD

## 2017-04-27 VITALS — BP 107/71 | HR 72 | Temp 98.0°F | Resp 16

## 2017-04-27 DIAGNOSIS — D508 Other iron deficiency anemias: Secondary | ICD-10-CM | POA: Diagnosis not present

## 2017-04-27 DIAGNOSIS — D509 Iron deficiency anemia, unspecified: Secondary | ICD-10-CM

## 2017-04-27 MED ORDER — HEPARIN SOD (PORK) LOCK FLUSH 100 UNIT/ML IV SOLN: 500.0000 [IU] | Freq: Once | INTRAVENOUS | Status: DC | PRN

## 2017-04-27 MED ORDER — SODIUM CHLORIDE 0.9% FLUSH
3.0000 mL | Freq: Once | INTRAVENOUS | Status: DC | PRN
  Filled 2017-04-27: qty 3

## 2017-04-27 MED ORDER — SODIUM CHLORIDE 0.9% FLUSH
10.0000 mL | INTRAVENOUS | Status: DC | PRN
  Filled 2017-04-27: qty 10

## 2017-04-27 MED ORDER — SODIUM CHLORIDE 0.9 % IV SOLN
510.0000 mg | Freq: Once | INTRAVENOUS | Status: AC
  Administered 2017-04-27: 510 mg via INTRAVENOUS
  Filled 2017-04-27: qty 17

## 2017-04-27 MED ORDER — HEPARIN SOD (PORK) LOCK FLUSH 100 UNIT/ML IV SOLN: 250.0000 [IU] | Freq: Once | INTRAVENOUS | Status: DC | PRN

## 2017-04-27 MED ORDER — SODIUM CHLORIDE 0.9 % IV SOLN
Freq: Once | INTRAVENOUS | Status: AC
  Administered 2017-04-27: 12:00:00 via INTRAVENOUS
  Filled 2017-04-27: qty 1000

## 2017-04-27 MED ORDER — ALTEPLASE 2 MG IJ SOLR: 2.0000 mg | Freq: Once | INTRAMUSCULAR | Status: DC | PRN

## 2017-04-27 NOTE — Patient Instructions (Signed)

## 2017-04-30 ENCOUNTER — Encounter: Payer: Self-pay | Admitting: Gastroenterology

## 2017-05-02 ENCOUNTER — Inpatient Hospital Stay: Payer: BLUE CROSS/BLUE SHIELD

## 2017-05-02 VITALS — BP 118/80 | HR 61 | Temp 97.0°F | Resp 18

## 2017-05-02 DIAGNOSIS — D509 Iron deficiency anemia, unspecified: Secondary | ICD-10-CM

## 2017-05-02 DIAGNOSIS — D508 Other iron deficiency anemias: Secondary | ICD-10-CM | POA: Diagnosis not present

## 2017-05-02 MED ORDER — SODIUM CHLORIDE 0.9 % IV SOLN
510.0000 mg | Freq: Once | INTRAVENOUS | Status: AC
  Administered 2017-05-02: 510 mg via INTRAVENOUS
  Filled 2017-05-02: qty 17

## 2017-05-02 MED ORDER — SODIUM CHLORIDE 0.9 % IV SOLN
Freq: Once | INTRAVENOUS | Status: AC
  Administered 2017-05-02: 14:00:00 via INTRAVENOUS
  Filled 2017-05-02: qty 1000

## 2017-05-02 NOTE — Patient Instructions (Signed)

## 2017-05-25 ENCOUNTER — Ambulatory Visit (INDEPENDENT_AMBULATORY_CARE_PROVIDER_SITE_OTHER): Payer: BLUE CROSS/BLUE SHIELD | Admitting: Family Medicine

## 2017-05-25 DIAGNOSIS — I2699 Other pulmonary embolism without acute cor pulmonale: Secondary | ICD-10-CM | POA: Diagnosis not present

## 2017-05-25 LAB — POCT INR
INR: 3.8
PT: 46.1

## 2017-05-25 NOTE — Patient Instructions (Signed)
Anticoagulation Warfarin Dose Instructions as of 05/25/2017      Kristen SmilesSun Mon Tue Wed Thu Fri Sat   New Dose 15 mg 20 mg 15 mg 20 mg 15 mg 20 mg 15 mg    Description   Dx: Pulmonary Embolism I26.99 Current Dose: 15mg  QD except 20mg  on Monday, Wednesday and Friday PT: 46.1 INR: 3.8 Today's Changes: HOLD for 3 days, then resume current dose  Recheck: 3 weeks

## 2017-06-01 NOTE — Progress Notes (Signed)
Anti-coagulation visit only. No MD visit.

## 2017-06-07 ENCOUNTER — Other Ambulatory Visit: Payer: Self-pay

## 2017-06-07 DIAGNOSIS — I2782 Chronic pulmonary embolism: Secondary | ICD-10-CM

## 2017-06-07 MED ORDER — WARFARIN SODIUM 5 MG PO TABS: ORAL_TABLET | ORAL | 4 refills | Status: DC

## 2017-06-07 NOTE — Telephone Encounter (Signed)
Pt is running out of coumadin early because of recent dose change. Sent new increased dose to pharmacy. Kristen DillonEmily Drozdowski, CMA

## 2017-06-15 ENCOUNTER — Ambulatory Visit (INDEPENDENT_AMBULATORY_CARE_PROVIDER_SITE_OTHER): Payer: BLUE CROSS/BLUE SHIELD

## 2017-06-15 DIAGNOSIS — I2699 Other pulmonary embolism without acute cor pulmonale: Secondary | ICD-10-CM

## 2017-06-15 LAB — POCT INR
INR: 4.9
PT: 58.2

## 2017-06-15 NOTE — Patient Instructions (Signed)
Anticoagulation Warfarin Dose Instructions as of 06/15/2017      Kristen [name] Mon Tue Wed Thu Fri Sat   New Dose 15 mg 15 mg 15 mg 15 mg 15 mg 15 mg 15 mg    Description   Dx: Pulmonary Embolism I26.99 Hold for 3 days then 15 mg daily, f/u 1 week

## 2017-06-18 ENCOUNTER — Ambulatory Visit: Payer: BC Managed Care – PPO | Admitting: Oncology

## 2017-06-18 ENCOUNTER — Other Ambulatory Visit: Payer: BC Managed Care – PPO

## 2017-06-25 ENCOUNTER — Inpatient Hospital Stay: Payer: BLUE CROSS/BLUE SHIELD

## 2017-06-25 ENCOUNTER — Ambulatory Visit (INDEPENDENT_AMBULATORY_CARE_PROVIDER_SITE_OTHER): Payer: BLUE CROSS/BLUE SHIELD | Admitting: Family Medicine

## 2017-06-25 ENCOUNTER — Encounter: Payer: Self-pay | Admitting: Oncology

## 2017-06-25 ENCOUNTER — Inpatient Hospital Stay: Payer: BLUE CROSS/BLUE SHIELD | Attending: Oncology | Admitting: Oncology

## 2017-06-25 VITALS — BP 124/84 | HR 60 | Temp 98.0°F | Resp 18 | Wt 191.8 lb

## 2017-06-25 DIAGNOSIS — K219 Gastro-esophageal reflux disease without esophagitis: Secondary | ICD-10-CM | POA: Diagnosis not present

## 2017-06-25 DIAGNOSIS — Z7901 Long term (current) use of anticoagulants: Secondary | ICD-10-CM | POA: Insufficient documentation

## 2017-06-25 DIAGNOSIS — Z79899 Other long term (current) drug therapy: Secondary | ICD-10-CM | POA: Insufficient documentation

## 2017-06-25 DIAGNOSIS — Z8049 Family history of malignant neoplasm of other genital organs: Secondary | ICD-10-CM

## 2017-06-25 DIAGNOSIS — Z800 Family history of malignant neoplasm of digestive organs: Secondary | ICD-10-CM | POA: Insufficient documentation

## 2017-06-25 DIAGNOSIS — F319 Bipolar disorder, unspecified: Secondary | ICD-10-CM | POA: Insufficient documentation

## 2017-06-25 DIAGNOSIS — G2581 Restless legs syndrome: Secondary | ICD-10-CM | POA: Diagnosis not present

## 2017-06-25 DIAGNOSIS — I2699 Other pulmonary embolism without acute cor pulmonale: Secondary | ICD-10-CM | POA: Diagnosis not present

## 2017-06-25 DIAGNOSIS — Z86718 Personal history of other venous thrombosis and embolism: Secondary | ICD-10-CM | POA: Diagnosis not present

## 2017-06-25 DIAGNOSIS — Z86711 Personal history of pulmonary embolism: Secondary | ICD-10-CM | POA: Diagnosis not present

## 2017-06-25 DIAGNOSIS — Z87442 Personal history of urinary calculi: Secondary | ICD-10-CM

## 2017-06-25 DIAGNOSIS — Z803 Family history of malignant neoplasm of breast: Secondary | ICD-10-CM | POA: Insufficient documentation

## 2017-06-25 DIAGNOSIS — D509 Iron deficiency anemia, unspecified: Secondary | ICD-10-CM | POA: Diagnosis not present

## 2017-06-25 DIAGNOSIS — Z9884 Bariatric surgery status: Secondary | ICD-10-CM | POA: Insufficient documentation

## 2017-06-25 LAB — CBC
HCT: 42.6 % (ref 35.0–47.0)
Hemoglobin: 14 g/dL (ref 12.0–16.0)
MCH: 29 pg (ref 26.0–34.0)
MCHC: 32.9 g/dL (ref 32.0–36.0)
MCV: 88.1 fL (ref 80.0–100.0)
Platelets: 287 K/uL (ref 150–440)
RBC: 4.84 MIL/uL (ref 3.80–5.20)
RDW: 19.2 % — ABNORMAL HIGH (ref 11.5–14.5)
WBC: 6.4 K/uL (ref 3.6–11.0)

## 2017-06-25 LAB — FERRITIN: Ferritin: 73 ng/mL (ref 11–307)

## 2017-06-25 LAB — IRON AND TIBC
Iron: 90 ug/dL (ref 28–170)
SATURATION RATIOS: 27 % (ref 10.4–31.8)
TIBC: 332 ug/dL (ref 250–450)
UIBC: 242 ug/dL

## 2017-06-25 LAB — POCT INR
INR: 2.6
PT: 30.7

## 2017-06-25 NOTE — Patient Instructions (Signed)
Decrease Warfarin to 10mg  Mondays and Fridays and 15mg  the rest of the week.  Recheck in two weeks.

## 2017-06-25 NOTE — Progress Notes (Signed)
Here for follow up

## 2017-06-25 NOTE — Progress Notes (Signed)
Hematology/Oncology Consult note [facility]  [phone] [phone]  Patient Care Team: Malva Limes, MD as PCP - General (Family Medicine) Cottle, Steva Ready., MD (Psychiatry)   Name of the patient: Kristen [name]  [id number]  12-14-1971   Date of visit: 06/25/17  Diagnosis- iron deficiency anemia likely due to gastric bypass  Chief complaint/ Reason for visit- routine f/u  Heme/Onc history: patient is a 46 year old female with a history of gastric bypass surgery in 2010. Prior to that she had a right lower extremity DVT in 2006 and was on Coumadin briefly. Prior gastric bypass surgery she had IVC filter placement. 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 There were attempts made to retrieve her IVC filter but that could not be done. She has also had a hypercoagulable workup in the past which did not reveal any cause for her DVT and PE. Currently patient is on 20 mg alternating with 15 mg of Coumadin every day but has had problems with INR control over the last couple of months.  She was seen by Dr. Tobi Bastos for iron deficiency anemia. CBC on 03/09/2017 showed white count of 9.9, H&H of 10.2/34.4 with an MCV of 73 and a platelet count of 533. CMP and TSH was within normal limits. Ferritin was low at 10. B12 was low at 117. Folate was normal. Celiac disease panel was negative. Intrinsic factor antibody was within normal limits. Urinalysis did not reveal any evidence of hematuria. Patient underwent EGD and colonoscopy which did not reveal any evidence of bleeding. There was evidence of gastric bypass. Patient has not had a capsule endoscopy yet.  Results of bloodwork from 04/23/2017 were as follows: CBC showed white count of 7. H&H 12.6/39.3 with an MCV of 79.3 and a platelet count of 417. Ferritin was low at 11. Serum iron was mildly elevated at 197. Iron saturation was 46%. B12 was high  normal at 277. Folate, copper and zinc within normal limits  She has received 2 doses of feraheme so far   Interval history- doing well. deneis any complaints  ECOG PS- 0 Pain scale- 0   Review of systems- Review of Systems  Constitutional: Negative for chills, fever, malaise/fatigue and weight loss.  HENT: Negative for congestion, ear discharge and nosebleeds.   Eyes: Negative for blurred vision.  Respiratory: Negative for cough, hemoptysis, sputum production, shortness of breath and wheezing.   Cardiovascular: Negative for chest pain, palpitations, orthopnea and claudication.  Gastrointestinal: Negative for abdominal pain, blood in stool, constipation, diarrhea, heartburn, melena, nausea and vomiting.  Genitourinary: Negative for dysuria, flank pain, frequency, hematuria and urgency.  Musculoskeletal: Negative for back pain, joint pain and myalgias.  Skin: Negative for rash.  Neurological: Negative for dizziness, tingling, focal weakness, seizures, weakness and headaches.  Endo/Heme/Allergies: Does not bruise/bleed easily.  Psychiatric/Behavioral: Negative for depression and suicidal ideas. The patient does not have insomnia.        Allergies  Allergen Reactions  . Codeine Nausea Only     Past Medical History:  Diagnosis Date  . Anemia   . Anxiety   . Bipolar disorder (HCC)   . Cholelithiasis   . DVT (deep venous thrombosis) (HCC)   . GERD (gastroesophageal reflux disease)   . Kidney stones   . Pulmonary embolism (HCC)   . RLS (restless legs syndrome)      Past Surgical History:  Procedure Laterality Date  .  CERVICAL BIOPSY  W/ LOOP ELECTRODE EXCISION  2007  . CHOLECYSTECTOMY    . COLONOSCOPY WITH PROPOFOL N/A 04/03/2017   Procedure: COLONOSCOPY WITH PROPOFOL;  Surgeon: Wyline MoodKiran Anna, MD;  [facility] END[facility]COPY;  Service: Endoscopy;  Laterality: N/A;  . ESOPHAGOGASTRODUODENOSCOPY (EGD) WITH PROPOFOL N/A 04/03/2017   Procedure: ESOPHAGOGASTRODUODENOSCOPY (EGD)  WITH PROPOFOL;  Surgeon: Wyline MoodKiran Anna, MD;  Location: [facility] ENDOSCOPY;  Service: Endoscopy;  Laterality: N/A;  . GASTRIC BYPASS    . HERNIA REPAIR    . kidney stone removal    . KNEE ARTHROSCOPY    . TONSILLECTOMY      Social History   Social History  . Marital status: Married    Spouse name: N/A  . Number of children: 3  . 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 Smoking status: Never Smoker  . Smokeless tobacco: Never Used  . Alcohol use No  . Drug use: No  . Sexual activity: Yes    Partners: Male    Birth control/ protection: IUD   Other Topics Concern  . Not on file   Social History Narrative  . No narrative on file    Family History  Problem Relation Age of Onset  . Diabetes Father   . Stroke Father   . Heart attack Father   . Kidney failure Father   . Hypertension Father   . Uterine cancer Maternal Grandmother   . Ulcers Maternal Grandmother   . Diabetes Maternal Grandfather   . Stroke Maternal Grandfather   . Breast cancer Paternal Grandmother   . Colon cancer Paternal Grandfather   . Hypertension Paternal Grandfather      Current Outpatient Prescriptions:  .  ALPRAZolam (XANAX) 0.5 MG tablet, Take 0.5 mg by mouth See admin instructions. 0.5 mg  Three times daily and 1 mg at bedtime, Disp: , Rfl: 0 .  ferrous sulfate 325 (65 FE) MG tablet, Take 325 mg by mouth daily with breakfast., Disp: , Rfl:  .  lamoTRIgine (LAMICTAL) 200 MG tablet, Take 200 mg by mouth daily., Disp: , Rfl:  .  levonorgestrel (MIRENA) 20 MCG/24HR IUD, by Intrauterine route., Disp: , Rfl:  .  Oxcarbazepine (TRILEPTAL) 300 MG tablet, Take 2 tablets by mouth daily., Disp: , Rfl: 0 .  vitamin B-12 (CYANOCOBALAMIN) 1000 MCG tablet, Take 1,000 mcg by mouth daily., Disp: , Rfl:  .  Vitamin K, Phytonadione, 100 MCG TABS, Take 100 mcg by mouth daily. Take with warfarin, Disp: , Rfl:  .  VRAYLAR 3 MG CAPS,  Take 1 capsule by mouth daily., Disp: , Rfl: 0 .  warfarin (COUMADIN) 5 MG tablet, TAKE UP TO 5 TABLETS DAILY AS DIRECTED, Disp: 150 tablet, Rfl: 4  Physical exam:  Vitals:   06/25/17 0956  BP: 124/84  Pulse: 60  Resp: 18  Temp: 98 F (36.7 C)  TempSrc: Tympanic  Weight: 191 lb 12.8 oz (87 kg)   Physical Exam  Constitutional: She is oriented to person, place, and time and well-developed, well-nourished, and in no distress.  HENT:  Head: Normocephalic and atraumatic.  Eyes: EOM are normal. Pupils are equal, round, and reactive to light.  Neck: Normal range of motion.  Cardiovascular: Normal rate, regular rhythm and normal heart sounds.   Pulmonary/Chest: Effort normal and breath sounds normal.  Abdominal: Soft. Bowel sounds are normal.  Neurological: She is alert and oriented to person, place, and time.  Skin:  Skin is warm and dry.     CMP Latest Ref Rng & Units 03/09/2017  Glucose 65 - 99 mg/dL 91  BUN 6 - 24 mg/dL 8  Creatinine 1.61 - 0.96 mg/dL 0.45  Sodium 409 - 811 mmol/L 140  Potassium 3.5 - 5.2 mmol/L 4.0  Chloride 96 - 106 mmol/L 100  CO2 18 - 29 mmol/L 22  Calcium 8.7 - 10.2 mg/dL 9.1  Total Protein 6.0 - 8.5 g/dL 6.8  Total Bilirubin 0.0 - 1.2 mg/dL 0.2  Alkaline Phos 39 - 117 IU/L 86  AST 0 - 40 IU/L 24  ALT 0 - 32 IU/L 15   CBC Latest Ref Rng & Units 06/25/2017  WBC 3.6 - 11.0 K/uL 6.4  Hemoglobin 12.0 - 16.0 g/dL 91.4  Hematocrit 78.2 - 47.0 % 42.6  Platelets 150 - 440 K/uL 287      Assessment and plan- Patient is a 46 y.o. female with iron deficiency anemia likely secondary to gastric bypass  Iron studies from today pending.H/H improved to 14/42.6 . Will hold off on IV iron at this time. I will repeat labs in 3 months and see her back in 6 months with cbc, ferritin, iron studies and B12   Visit Diagnosis 1. Iron deficiency anemia, unspecified iron deficiency anemia type      Dr. Owens Shark, MD, MPH [facility] at [facility] Pager- [phone] 06/25/2017  10:09 AM

## 2017-07-11 ENCOUNTER — Ambulatory Visit (INDEPENDENT_AMBULATORY_CARE_PROVIDER_SITE_OTHER): Payer: BLUE CROSS/BLUE SHIELD

## 2017-07-11 DIAGNOSIS — I2699 Other pulmonary embolism without acute cor pulmonale: Secondary | ICD-10-CM | POA: Diagnosis not present

## 2017-07-11 LAB — POCT INR
INR: 3.1
PT: 37.8

## 2017-07-11 NOTE — Patient Instructions (Signed)
Anticoagulation Warfarin Dose Instructions as of 07/11/2017      Glynis SmilesSun Mon Tue Wed Thu Fri Sat   New Dose 15 mg 15 mg 15 mg 15 mg 15 mg 15 mg 15 mg    Description   Dx: Pulmonary Embolism I26.99 Continue Warfarin 10mg  Mondays and Fridays and 15mg  the rest of the week.  Recheck in three weeks.

## 2017-08-01 ENCOUNTER — Ambulatory Visit (INDEPENDENT_AMBULATORY_CARE_PROVIDER_SITE_OTHER): Payer: BLUE CROSS/BLUE SHIELD

## 2017-08-01 DIAGNOSIS — I2699 Other pulmonary embolism without acute cor pulmonale: Secondary | ICD-10-CM

## 2017-08-01 LAB — POCT INR
INR: 2.2
PT: 26.7

## 2017-08-01 NOTE — Patient Instructions (Signed)
Anticoagulation Warfarin Dose Instructions as of 08/01/2017      Kristen SmilesSun Mon Tue Wed Thu Fri Sat   New Dose 15 mg 10 mg 15 mg 15 mg 15 mg 10 mg 15 mg    Description   Dx: Pulmonary Embolism I26.99 Current Dose: 15mg  everyday, except 10mg  on Monday and Friday PT: 26.7 INR: 2.2 Today's changes: NO CHANGE Recheck: 3 weeks

## 2017-08-22 ENCOUNTER — Ambulatory Visit (INDEPENDENT_AMBULATORY_CARE_PROVIDER_SITE_OTHER): Payer: BLUE CROSS/BLUE SHIELD

## 2017-08-22 DIAGNOSIS — I2699 Other pulmonary embolism without acute cor pulmonale: Secondary | ICD-10-CM

## 2017-08-22 LAB — POCT INR
INR: 2
PT: 24.2

## 2017-08-22 NOTE — Patient Instructions (Addendum)
Anticoagulation Warfarin Dose Instructions as of 08/22/2017      Glynis SmilesSun Mon Tue Wed Thu Fri Sat   New Dose 15 mg 10 mg 15 mg 15 mg 15 mg 10 mg 15 mg    Description   Dx: Pulmonary Embolism I26.99 Current Dose: 15mg  everyday, except 10mg  on Monday and Friday PT: 26.7 INR: 2.2 Today's changes: NO CHANGE Recheck: 4 weeks

## 2017-09-14 ENCOUNTER — Other Ambulatory Visit: Payer: Self-pay | Admitting: *Deleted

## 2017-09-14 DIAGNOSIS — E611 Iron deficiency: Secondary | ICD-10-CM

## 2017-09-17 ENCOUNTER — Inpatient Hospital Stay: Payer: BLUE CROSS/BLUE SHIELD | Attending: Oncology

## 2017-09-17 DIAGNOSIS — R634 Abnormal weight loss: Secondary | ICD-10-CM

## 2017-09-17 DIAGNOSIS — D509 Iron deficiency anemia, unspecified: Secondary | ICD-10-CM | POA: Insufficient documentation

## 2017-09-17 DIAGNOSIS — Z9884 Bariatric surgery status: Secondary | ICD-10-CM | POA: Diagnosis not present

## 2017-09-17 DIAGNOSIS — E538 Deficiency of other specified B group vitamins: Secondary | ICD-10-CM

## 2017-09-17 DIAGNOSIS — E611 Iron deficiency: Secondary | ICD-10-CM

## 2017-09-17 DIAGNOSIS — D508 Other iron deficiency anemias: Secondary | ICD-10-CM

## 2017-09-17 LAB — IRON AND TIBC
Iron: 73 ug/dL (ref 28–170)
Saturation Ratios: 22 % (ref 10.4–31.8)
TIBC: 331 ug/dL (ref 250–450)
UIBC: 258 ug/dL

## 2017-09-17 LAB — CBC
HEMATOCRIT: 41.5 % (ref 35.0–47.0)
HEMOGLOBIN: 14 g/dL (ref 12.0–16.0)
MCH: 31.5 pg (ref 26.0–34.0)
MCHC: 33.8 g/dL (ref 32.0–36.0)
MCV: 93.3 fL (ref 80.0–100.0)
Platelets: 333 10*3/uL (ref 150–440)
RBC: 4.45 MIL/uL (ref 3.80–5.20)
RDW: 15.1 % — AB (ref 11.5–14.5)
WBC: 7.5 10*3/uL (ref 3.6–11.0)

## 2017-09-17 LAB — FERRITIN: FERRITIN: 64 ng/mL (ref 11–307)

## 2017-09-18 ENCOUNTER — Telehealth: Payer: Self-pay | Admitting: *Deleted

## 2017-09-18 ENCOUNTER — Other Ambulatory Visit: Payer: Self-pay | Admitting: Oncology

## 2017-09-18 LAB — VITAMIN B12: Vitamin B-12: 96 pg/mL — ABNORMAL LOW (ref 180–914)

## 2017-09-18 NOTE — Telephone Encounter (Signed)
Called pt to let her know about b12 level and she called me back to say that she got my message and she would like to start b12 injections monthly this Thursday if possible. I told her she could and I would ask scheduler to make her an appt and pt wants afternoon 10/4.  Will send message for 10/4 at 3:30.  I asked her to pick up copy of future visits when she comes on Thursday and she will.

## 2017-09-18 NOTE — Telephone Encounter (Signed)
-----   Message from Creig Hines, MD sent at 09/18/2017  8:04 AM EDT ----- She needs to start monthly B12 shots asap. Treatment plan is in

## 2017-09-18 NOTE — Telephone Encounter (Signed)
Called pt and left message that her b12 level was low and Dr. Smith Robert knows that she has been taking oral b12 every day and with the level low she feels that pt needs to start b12 injections monthly. If she would call me back and let me know if she would like to start then we can set up appts for her to start monthly. Will await her call back.

## 2017-09-20 ENCOUNTER — Inpatient Hospital Stay: Payer: BLUE CROSS/BLUE SHIELD

## 2017-09-20 VITALS — BP 113/74 | HR 73 | Temp 97.1°F | Resp 17

## 2017-09-20 DIAGNOSIS — D509 Iron deficiency anemia, unspecified: Secondary | ICD-10-CM

## 2017-09-20 MED ORDER — CYANOCOBALAMIN 1000 MCG/ML IJ SOLN
1000.0000 ug | INTRAMUSCULAR | Status: DC
  Administered 2017-09-20: 1000 ug via INTRAMUSCULAR
  Filled 2017-09-20: qty 1

## 2017-09-21 ENCOUNTER — Encounter: Payer: Self-pay | Admitting: Family Medicine

## 2017-09-21 ENCOUNTER — Ambulatory Visit (INDEPENDENT_AMBULATORY_CARE_PROVIDER_SITE_OTHER): Payer: BLUE CROSS/BLUE SHIELD | Admitting: Family Medicine

## 2017-09-21 VITALS — BP 94/60 | HR 67 | Temp 98.4°F | Resp 16 | Wt 194.0 lb

## 2017-09-21 DIAGNOSIS — L989 Disorder of the skin and subcutaneous tissue, unspecified: Secondary | ICD-10-CM

## 2017-09-21 DIAGNOSIS — Z86711 Personal history of pulmonary embolism: Secondary | ICD-10-CM | POA: Diagnosis not present

## 2017-09-21 LAB — POCT INR
INR: 2
PT: 24.1

## 2017-09-21 MED ORDER — MUPIROCIN 2 % EX OINT: 1.0000 "application " | TOPICAL_OINTMENT | Freq: Two times a day (BID) | CUTANEOUS | 0 refills | Status: DC

## 2017-09-21 NOTE — Progress Notes (Signed)
Patient: Kristen [name] Female    DOB: [birth year]   46 y.o.   MRN: [id number] Visit Date: 09/21/2017  Today's Provider: Mila Merry, MD   Chief Complaint  Patient presents with  . Sore   Subjective:    HPI  Patient has had a sore on her left heel for 1 1/2 months. Patient stated that sore started as a blister but has since turned to a sore that is not heeling. Also skin has turned a darker color and is dry.   Presents for anticoagulation management for history of PE/. Doing well on current dose of warfarin without adverse effect.    Allergies  Allergen Reactions  . Codeine Nausea Only     Current Outpatient Prescriptions:  .  ALPRAZolam (XANAX) 0.5 MG tablet, Take 0.5 mg by mouth See admin instructions. 0.5 mg  Three times daily and 1 mg at bedtime, Disp: , Rfl: 0 .  Cariprazine HCl (VRAYLAR) 1.5 MG CAPS, Take by mouth every morning., Disp: , Rfl:  .  ferrous sulfate 325 (65 FE) MG tablet, Take 325 mg by mouth daily with breakfast., Disp: , Rfl:  .  lamoTRIgine (LAMICTAL) 200 MG tablet, Take 200 mg by mouth daily., Disp: , Rfl:  .  levonorgestrel (MIRENA) 20 MCG/24HR IUD, by Intrauterine route., Disp: , Rfl:  .  Oxcarbazepine (TRILEPTAL) 300 MG tablet, Take 2 tablets by mouth daily., Disp: , Rfl: 0 .  vitamin B-12 (CYANOCOBALAMIN) 1000 MCG tablet, Take 1,000 mcg by mouth daily., Disp: , Rfl:  .  Vitamin K, Phytonadione, 100 MCG TABS, Take 100 mcg by mouth daily. Take with warfarin, Disp: , Rfl:  .  warfarin (COUMADIN) 5 MG tablet, TAKE UP TO 5 TABLETS DAILY AS DIRECTED, Disp: 150 tablet, Rfl: 4  Review of Systems  Constitutional: Negative for appetite change, chills, fatigue and fever.  Respiratory: Negative for chest tightness and shortness of breath.   Cardiovascular: Negative for chest pain and palpitations.  Gastrointestinal: Negative for abdominal pain, nausea and vomiting.  Skin: Positive for wound.  Neurological: Negative for dizziness and weakness.     Social History  Substance Use Topics  . Smoking status: Never Smoker  . Smokeless tobacco: Never Used  . Alcohol use No   Objective:   BP 94/60 (BP Location: Right Arm, Patient Position: Sitting, Cuff Size: Large)   Pulse 67   Temp 98.4 F (36.9 C) (Oral)   Resp 16   Wt 194 lb (88 kg)   SpO2 97%   BMI 28.65 kg/m  Vitals:   09/21/17 1615  BP: 94/60  Pulse: 67  Resp: 16  Temp: 98.4 F (36.9 C)  TempSrc: Oral  SpO2: 97%  Weight: 194 lb (88 kg)     Physical Exam  General appearance: alert, well developed, well nourished, cooperative and in no distress Head: Normocephalic, without obvious abnormality, atraumatic Respiratory: Respirations even and unlabored, normal respiratory rate Extremities: Left heel dry with cracked open sore with scant amount clear discharge.   Results for orders placed or performed in visit on 09/21/17  POCT INR  Result Value Ref Range   INR 2.0    PT 24.0        Assessment & Plan:     1. Heel sore  - mupirocin ointment (BACTROBAN) 2 %; Apply 1 application topically 2 (two) times daily.  Dispense: 22 g; Refill: 0 Call if symptoms change or if not rapidly improving.      2. Pulmonary  embolism history Therapeutic on warfarin. Continue current medications.  Check PT/INR one month - POCT INR       Mila Merry, MD  [facility]

## 2017-09-21 NOTE — Patient Instructions (Addendum)
Dx: Pulmonary Embolism I26.99 Current Dose:  everyday, except  on Monday and Friday PT: 24.1 INR: 2.0 Today's changes: NO CHANGE Recheck: 4 weeks

## 2017-09-24 ENCOUNTER — Ambulatory Visit: Payer: Self-pay | Admitting: Family Medicine

## 2017-09-27 ENCOUNTER — Other Ambulatory Visit: Payer: Self-pay | Admitting: Obstetrics and Gynecology

## 2017-09-27 ENCOUNTER — Encounter: Payer: Self-pay | Admitting: Obstetrics and Gynecology

## 2017-09-27 ENCOUNTER — Ambulatory Visit (INDEPENDENT_AMBULATORY_CARE_PROVIDER_SITE_OTHER): Payer: BLUE CROSS/BLUE SHIELD | Admitting: Obstetrics and Gynecology

## 2017-09-27 VITALS — BP 101/74 | HR 60 | Ht 69.0 in | Wt 186.2 lb

## 2017-09-27 DIAGNOSIS — Z01419 Encounter for gynecological examination (general) (routine) without abnormal findings: Secondary | ICD-10-CM | POA: Diagnosis not present

## 2017-09-27 LAB — RESULTS CONSOLE HPV: CHL HPV: NEGATIVE

## 2017-09-27 NOTE — Patient Instructions (Addendum)
Preventive Care 18-39 Years, Female Preventive care refers to lifestyle choices and visits with your health care provider that can promote health and wellness. What does preventive care include?  A yearly physical exam. This is also called an annual well check.  Dental exams once or twice a year.  Routine eye exams. Ask your health care provider how often you should have your eyes checked.  Personal lifestyle choices, including: ? Daily care of your teeth and gums. ? Regular physical activity. ? Eating a healthy diet. ? Avoiding tobacco and drug use. ? Limiting alcohol use. ? Practicing safe sex. ? Taking vitamin and mineral supplements as recommended by your health care provider. What happens during an annual well check? The services and screenings done by your health care provider during your annual well check will depend on your age, overall health, lifestyle risk factors, and family history of disease. Counseling Your health care provider may ask you questions about your:  Alcohol use.  Tobacco use.  Drug use.  Emotional well-being.  Home and relationship well-being.  Sexual activity.  Eating habits.  Work and work Statistician.  Method of birth control.  Menstrual cycle.  Pregnancy history.  Screening You may have the following tests or measurements:  Height, weight, and BMI.  Diabetes screening. This is done by checking your blood sugar (glucose) after you have not eaten for a while (fasting).  Blood pressure.  Lipid and cholesterol levels. These may be checked every 5 years starting at age 16.  Skin check.  Hepatitis C blood test.  Hepatitis B blood test.  Sexually transmitted disease (STD) testing.  BRCA-related cancer screening. This may be done if you have a family history of breast, ovarian, tubal, or peritoneal cancers.  Pelvic exam and Pap test. This may be done every 3 years starting at age 42. Starting at age 23, this may be done  every 5 years if you have a Pap test in combination with an HPV test.  Discuss your test results, treatment options, and if necessary, the need for more tests with your health care provider. Vaccines Your health care provider may recommend certain vaccines, such as:  Influenza vaccine. This is recommended every year.  Tetanus, diphtheria, and acellular pertussis (Tdap, Td) vaccine. You may need a Td booster every 10 years.  Varicella vaccine. You may need this if you have not been vaccinated.  HPV vaccine. If you are 38 or younger, you may need three doses over 6 months.  Measles, mumps, and rubella (MMR) vaccine. You may need at least one dose of MMR. You may also need a second dose.  Pneumococcal 13-valent conjugate (PCV13) vaccine. You may need this if you have certain conditions and were not previously vaccinated.  Pneumococcal polysaccharide (PPSV23) vaccine. You may need one or two doses if you smoke cigarettes or if you have certain conditions.  Meningococcal vaccine. One dose is recommended if you are age 60-21 years and a first-year college student living in a residence hall, or if you have one of several medical conditions. You may also need additional booster doses.  Hepatitis A vaccine. You may need this if you have certain conditions or if you travel or work in places where you may be exposed to hepatitis A.  Hepatitis B vaccine. You may need this if you have certain conditions or if you travel or work in places where you may be exposed to hepatitis B.  Haemophilus influenzae type b (Hib) vaccine. You may need this  if you have certain risk factors.  Talk to your health care provider about which screenings and vaccines you need and how often you need them. This information is not intended to replace advice given to you by your health care provider. Make sure you discuss any questions you have with your health care provider. Document Released: 01/30/2002 Document Revised:  08/23/2016 Document Reviewed: 10/05/2015 Elsevier Interactive Patient Education  2017 Elsevier Inc.   Menopause Menopause is the normal time of life when menstrual periods stop completely. Menopause is complete when you have missed 12 consecutive menstrual periods. It usually occurs between the ages of 48 years and 55 years. Very rarely does a woman develop menopause before the age of 40 years. At menopause, your ovaries stop producing the female hormones estrogen and progesterone. This can cause undesirable symptoms and also affect your health. Sometimes the symptoms may occur 4-5 years before the menopause begins. There is no relationship between menopause and:  Oral contraceptives.  Number of children you had.  Race.  The age your menstrual periods started (menarche).  Heavy smokers and very thin women may develop menopause earlier in life. What are the causes?  The ovaries stop producing the female hormones estrogen and progesterone. Other causes include:  Surgery to remove both ovaries.  The ovaries stop functioning for no known reason.  Tumors of the pituitary gland in the brain.  Medical disease that affects the ovaries and hormone production.  Radiation treatment to the abdomen or pelvis.  Chemotherapy that affects the ovaries.  What are the signs or symptoms?  Hot flashes.  Night sweats.  Decrease in sex drive.  Vaginal dryness and thinning of the vagina causing painful intercourse.  Dryness of the skin and developing wrinkles.  Headaches.  Tiredness.  Irritability.  Memory problems.  Weight gain.  Bladder infections.  Hair growth of the face and chest.  Infertility. More serious symptoms include:  Loss of bone (osteoporosis) causing breaks (fractures).  Depression.  Hardening and narrowing of the arteries (atherosclerosis) causing heart attacks and strokes.  How is this diagnosed?  When the menstrual periods have stopped for 12 straight  months.  Physical exam.  Hormone studies of the blood. How is this treated? There are many treatment choices and nearly as many questions about them. The decisions to treat or not to treat menopausal changes is an individual choice made with your health care provider. Your health care provider can discuss the treatments with you. Together, you can decide which treatment will work best for you. Your treatment choices may include:  Hormone therapy (estrogen and progesterone).  Non-hormonal medicines.  Treating the individual symptoms with medicine (for example antidepressants for depression).  Herbal medicines that may help specific symptoms.  Counseling by a psychiatrist or psychologist.  Group therapy.  Lifestyle changes including: ? Eating healthy. ? Regular exercise. ? Limiting caffeine and alcohol. ? Stress management and meditation.  No treatment.  Follow these instructions at home:  Take the medicine your health care provider gives you as directed.  Get plenty of sleep and rest.  Exercise regularly.  Eat a diet that contains calcium (good for the bones) and soy products (acts like estrogen hormone).  Avoid alcoholic beverages.  Do not smoke.  If you have hot flashes, dress in layers.  Take supplements, calcium, and vitamin D to strengthen bones.  You can use over-the-counter lubricants or moisturizers for vaginal dryness.  Group therapy is sometimes very helpful.  Acupuncture may be helpful in some cases.   Contact a health care provider if:  You are not sure you are in menopause.  You are having menopausal symptoms and need advice and treatment.  You are still having menstrual periods after age 87 years.  You have pain with intercourse.  Menopause is complete (no menstrual period for 12 months) and you develop vaginal bleeding.  You need a referral to a specialist (gynecologist, psychiatrist, or psychologist) for treatment. Get help right away  if:  You have severe depression.  You have excessive vaginal bleeding.  You fell and think you have a broken bone.  You have pain when you urinate.  You develop leg or chest pain.  You have a fast pounding heart beat (palpitations).  You have severe headaches.  You develop vision problems.  You feel a lump in your breast.  You have abdominal pain or severe indigestion. This information is not intended to replace advice given to you by your health care provider. Make sure you discuss any questions you have with your health care provider. Document Released: 02/24/2004 Document Revised: 05/11/2016 Document Reviewed: 07/03/2013 Elsevier Interactive Patient Education  2017 [employer].

## 2017-09-27 NOTE — Progress Notes (Signed)
Subjective:   Kristen [name] is a 46 y.o. G65P3003 Caucasian female here for a routine well-woman exam.  Patient's last menstrual period was 09/01/2017.    Current complaints: irregular menses(light) and hot flashes and breast tenderness; mood swings PCP: Sherrie Mustache       does desire hormone labs  Social History: Sexual: heterosexual Marital Status: married Living situation: with family Occupation: Special Ed Runner, broadcasting/film/video Tobacco/alcohol: no tobacco use Illicit drugs: no history of illicit drug use  The following portions of the patient's history were reviewed and updated as appropriate: allergies, current medications, past family history, past medical history, past social history, past surgical history and problem list.  Past Medical History Past Medical History:  Diagnosis Date  . Anemia   . Anxiety   . Bipolar disorder (HCC)   . Cholelithiasis   . DVT (deep venous thrombosis) (HCC)   . GERD (gastroesophageal reflux disease)   . Kidney stones   . Pulmonary embolism (HCC)   . RLS (restless legs syndrome)     Past Surgical History Past Surgical History:  Procedure Laterality Date  . CERVICAL BIOPSY  W/ LOOP ELECTRODE EXCISION  2007  . CHOLECYSTECTOMY    . COLONOSCOPY WITH PROPOFOL N/A 04/03/2017   Procedure: COLONOSCOPY WITH PROPOFOL;  Surgeon: Wyline Mood, MD;  Location: [facility] ENDOSCOPY;  Service: Endoscopy;  Laterality: N/A;  . ESOPHAGOGASTRODUODENOSCOPY (EGD) WITH PROPOFOL N/A 04/03/2017   Procedure: ESOPHAGOGASTRODUODENOSCOPY (EGD) WITH PROPOFOL;  Surgeon: Wyline Mood, MD;  Location: [facility] ENDOSCOPY;  Service: Endoscopy;  Laterality: N/A;  . GASTRIC BYPASS    . HERNIA REPAIR    . kidney stone removal    . KNEE ARTHROSCOPY    . TONSILLECTOMY      Gynecologic History G3P3003  Patient's last menstrual period was 09/01/2017. Contraception: IUD Last Pap: 2015. Results were: normal Last mammogram: ?. Results were: normal  Obstetric History OB History  Gravida Para Term Preterm  AB Living  SAB TAB Ectopic Multiple Live Births          3    # Outcome Date GA Lbr Len/2nd Weight Sex Delivery Anes PTL Lv  3 Term 2002    M Vag-Spont  N LIV  2 Term 2000    F Vag-Spont  N LIV  1 Term 1998    M Vag-Spont  N LIV      Current Medications Current Outpatient Prescriptions on File Prior to Visit  Medication Sig Dispense Refill  . ALPRAZolam (XANAX) 0.5 MG tablet Take 0.5 mg by mouth See admin instructions. 0.5 mg  Three times daily and 1 mg at bedtime  0  . Cariprazine HCl (VRAYLAR) 1.5 MG CAPS Take by mouth every morning.    . ferrous sulfate 325 (65 FE) MG tablet Take 325 mg by mouth daily with breakfast.    . lamoTRIgine (LAMICTAL) 200 MG tablet Take 200 mg by mouth daily.    Marland Kitchen levonorgestrel (MIRENA) 20 MCG/24HR IUD by Intrauterine route.    . mupirocin ointment (BACTROBAN) 2 % Apply 1 application topically 2 (two) times daily. 22 g 0  . Oxcarbazepine (TRILEPTAL) 300 MG tablet Take 2 tablets by mouth daily.  0  . vitamin B-12 (CYANOCOBALAMIN) 1000 MCG tablet Take 1,000 mcg by mouth daily.    . Vitamin K, Phytonadione, 100 MCG TABS Take 100 mcg by mouth daily. Take with warfarin    . warfarin (COUMADIN) 5 MG tablet TAKE UP TO 5 TABLETS DAILY AS DIRECTED  150 tablet 4   No current facility-administered medications on file prior to visit.     Review of Systems Patient denies any headaches, blurred vision, shortness of breath, chest pain, abdominal pain, problems with bowel movements, urination, or intercourse.  Objective:  BP 101/74   Pulse 60   Ht  (1.753 m)   Wt 186 lb 3.2 oz (84.5 kg)   LMP 09/01/2017   BMI 27.50 kg/m  Physical Exam  General:  Well developed, well nourished, no acute distress. She is alert and oriented x3. Skin:  Warm and dry Neck:  Midline trachea, no thyromegaly or nodules Cardiovascular: Regular rate and rhythm, no murmur heard Lungs:  Effort normal, all lung fields clear to auscultation bilaterally Breasts:  No  dominant palpable mass, retraction, or nipple discharge Abdomen:  Soft, non tender, no hepatosplenomegaly or masses Pelvic:  External genitalia is normal in appearance.  The vagina is normal in appearance. The cervix is bulbous, no CMT.  Thin prep pap is done with HR HPV cotesting. Uterus is felt to be normal size, shape, and contour.  No adnexal masses or tenderness noted. IUD string noted. Extremities:  No swelling or varicosities noted Psych:  She has a normal mood and affect  Assessment:   Healthy well-woman exam B12 deficiency Iron deficiency anemia Irregular menses IUD check Bi-polar disorder   Plan:  Labs obtained -will follow up accordingly Samples of fusion Plus given and will call if desires script Counseled at length on peri-menopause F/U 1 year for AE, or sooner if needed Mammogram ordered  Melody Suzan Nailer, CNM

## 2017-09-28 ENCOUNTER — Telehealth: Payer: Self-pay | Admitting: *Deleted

## 2017-09-28 LAB — FERRITIN: FERRITIN: 112 ng/mL (ref 15–150)

## 2017-09-28 LAB — FSH/LH
FSH: 3.2 m[IU]/mL
LH: 1.6 m[IU]/mL

## 2017-09-28 LAB — ESTRADIOL: Estradiol: 104.2 pg/mL

## 2017-09-28 LAB — PROGESTERONE: PROGESTERONE: 5.4 ng/mL

## 2017-09-28 LAB — DHEA-SULFATE: DHEA-SO4: 100.4 ug/dL (ref 41.2–243.7)

## 2017-09-28 NOTE — Telephone Encounter (Signed)
Mailed all info to pt 

## 2017-09-28 NOTE — Telephone Encounter (Signed)
-----   Message from Purcell Nails, [place] sent at 09/28/2017 12:03 PM EDT ----- Please let her know blood work is all normal

## 2017-10-01 LAB — CYTOLOGY - PAP

## 2017-10-22 ENCOUNTER — Ambulatory Visit: Payer: BLUE CROSS/BLUE SHIELD

## 2017-10-22 ENCOUNTER — Ambulatory Visit: Payer: Self-pay | Admitting: Family Medicine

## 2017-10-22 ENCOUNTER — Ambulatory Visit: Payer: BC Managed Care – PPO

## 2017-11-15 ENCOUNTER — Ambulatory Visit
Admission: RE | Admit: 2017-11-15 | Discharge: 2017-11-15 | Disposition: A | Discharge status: Home / Self Care | Payer: BLUE CROSS/BLUE SHIELD | Source: Ambulatory Visit | Attending: Obstetrics and Gynecology | Admitting: Obstetrics and Gynecology

## 2017-11-15 DIAGNOSIS — Z1231 Encounter for screening mammogram for malignant neoplasm of breast: Secondary | ICD-10-CM | POA: Insufficient documentation

## 2017-11-15 DIAGNOSIS — Z01419 Encounter for gynecological examination (general) (routine) without abnormal findings: Secondary | ICD-10-CM

## 2017-11-16 ENCOUNTER — Other Ambulatory Visit: Payer: Self-pay | Admitting: *Deleted

## 2017-11-16 ENCOUNTER — Inpatient Hospital Stay
Admission: RE | Admit: 2017-11-16 | Discharge: 2017-11-16 | Disposition: A | Discharge status: Home / Self Care | Payer: Self-pay | Source: Ambulatory Visit | Attending: *Deleted | Admitting: *Deleted

## 2017-11-16 DIAGNOSIS — Z9289 Personal history of other medical treatment: Secondary | ICD-10-CM

## 2017-11-21 ENCOUNTER — Ambulatory Visit: Payer: BC Managed Care – PPO

## 2017-11-21 ENCOUNTER — Ambulatory Visit: Payer: BLUE CROSS/BLUE SHIELD

## 2017-11-23 ENCOUNTER — Ambulatory Visit: Payer: BLUE CROSS/BLUE SHIELD

## 2017-12-20 ENCOUNTER — Other Ambulatory Visit: Payer: BC Managed Care – PPO

## 2017-12-21 ENCOUNTER — Inpatient Hospital Stay: Payer: BLUE CROSS/BLUE SHIELD

## 2017-12-21 ENCOUNTER — Inpatient Hospital Stay: Payer: BLUE CROSS/BLUE SHIELD | Attending: Oncology

## 2017-12-21 ENCOUNTER — Ambulatory Visit: Payer: BC Managed Care – PPO

## 2017-12-21 VITALS — BP 109/75 | HR 66 | Temp 98.3°F | Resp 18

## 2017-12-21 DIAGNOSIS — Z7901 Long term (current) use of anticoagulants: Secondary | ICD-10-CM | POA: Diagnosis not present

## 2017-12-21 DIAGNOSIS — Z9884 Bariatric surgery status: Secondary | ICD-10-CM | POA: Insufficient documentation

## 2017-12-21 DIAGNOSIS — D509 Iron deficiency anemia, unspecified: Secondary | ICD-10-CM

## 2017-12-21 DIAGNOSIS — Z86711 Personal history of pulmonary embolism: Secondary | ICD-10-CM | POA: Diagnosis not present

## 2017-12-21 DIAGNOSIS — Z86718 Personal history of other venous thrombosis and embolism: Secondary | ICD-10-CM | POA: Diagnosis not present

## 2017-12-21 DIAGNOSIS — E538 Deficiency of other specified B group vitamins: Secondary | ICD-10-CM | POA: Insufficient documentation

## 2017-12-21 LAB — IRON AND TIBC
Iron: 102 ug/dL (ref 28–170)
SATURATION RATIOS: 29 % (ref 10.4–31.8)
TIBC: 351 ug/dL (ref 250–450)
UIBC: 249 ug/dL

## 2017-12-21 LAB — CBC WITH DIFFERENTIAL/PLATELET
BASOS PCT: 1 %
Basophils Absolute: 0.1 10*3/uL (ref 0–0.1)
Eosinophils Absolute: 0.3 10*3/uL (ref 0–0.7)
Eosinophils Relative: 4 %
HEMATOCRIT: 44.3 % (ref 35.0–47.0)
Hemoglobin: 14.8 g/dL (ref 12.0–16.0)
LYMPHS ABS: 1.3 10*3/uL (ref 1.0–3.6)
LYMPHS PCT: 18 %
MCH: 31.1 pg (ref 26.0–34.0)
MCHC: 33.4 g/dL (ref 32.0–36.0)
MCV: 93.1 fL (ref 80.0–100.0)
MONOS PCT: 8 %
Monocytes Absolute: 0.6 10*3/uL (ref 0.2–0.9)
NEUTROS ABS: 4.8 10*3/uL (ref 1.4–6.5)
NEUTROS PCT: 69 %
Platelets: 294 10*3/uL (ref 150–440)
RBC: 4.76 MIL/uL (ref 3.80–5.20)
RDW: 13 % (ref 11.5–14.5)
WBC: 6.9 10*3/uL (ref 3.6–11.0)

## 2017-12-21 LAB — FERRITIN: Ferritin: 53 ng/mL (ref 11–307)

## 2017-12-21 LAB — FOLATE: Folate: 8.5 ng/mL (ref >5.9)

## 2017-12-21 MED ORDER — CYANOCOBALAMIN 1000 MCG/ML IJ SOLN
1000.0000 ug | INTRAMUSCULAR | Status: DC
  Administered 2017-12-21: 1000 ug via INTRAMUSCULAR

## 2017-12-22 LAB — VITAMIN B12: VITAMIN B 12: 140 pg/mL — AB (ref 180–914)

## 2017-12-24 ENCOUNTER — Inpatient Hospital Stay: Payer: BLUE CROSS/BLUE SHIELD

## 2017-12-24 ENCOUNTER — Other Ambulatory Visit: Payer: Self-pay

## 2017-12-24 ENCOUNTER — Inpatient Hospital Stay: Payer: BLUE CROSS/BLUE SHIELD | Admitting: Oncology

## 2017-12-24 ENCOUNTER — Encounter: Payer: Self-pay | Admitting: Oncology

## 2017-12-24 VITALS — BP 117/81 | HR 67 | Temp 96.6°F | Resp 18 | Wt 189.3 lb

## 2017-12-24 DIAGNOSIS — Z86711 Personal history of pulmonary embolism: Secondary | ICD-10-CM

## 2017-12-24 DIAGNOSIS — Z86718 Personal history of other venous thrombosis and embolism: Secondary | ICD-10-CM

## 2017-12-24 DIAGNOSIS — D509 Iron deficiency anemia, unspecified: Secondary | ICD-10-CM

## 2017-12-24 DIAGNOSIS — E538 Deficiency of other specified B group vitamins: Secondary | ICD-10-CM

## 2017-12-24 DIAGNOSIS — Z7901 Long term (current) use of anticoagulants: Secondary | ICD-10-CM

## 2017-12-24 DIAGNOSIS — Z9884 Bariatric surgery status: Secondary | ICD-10-CM | POA: Diagnosis not present

## 2017-12-24 DIAGNOSIS — D508 Other iron deficiency anemias: Secondary | ICD-10-CM

## 2017-12-24 MED ORDER — CYANOCOBALAMIN 1000 MCG/ML IJ SOLN
1000.0000 ug | Freq: Once | INTRAMUSCULAR | Status: AC
  Administered 2017-12-24: 1000 ug via INTRAMUSCULAR

## 2017-12-24 NOTE — Addendum Note (Signed)
Addended by: Corene CorneaVENABLE, Lanissa Cashen Y on: 12/24/2017 11:40 AM   Modules accepted: Orders

## 2017-12-24 NOTE — Progress Notes (Signed)
Her for follow up. Stated has no energy,"so tired "-stated she thinks she could sleep all the time.

## 2017-12-24 NOTE — Progress Notes (Signed)
Hematology/Oncology Consult note [facility]  [phone] [phone]  Patient Care Team: Malva Limes, MD as PCP - General (Family Medicine) Cottle, Steva Ready., MD (Psychiatry)   Name of the patient: Kristen [name]  [id number]  06-21-1971   Date of visit: 12/24/17  Diagnosis- iron deficiency anemia and B12 deficiency likely due to gastric bypass  Chief complaint/ Reason for visit- routine f/u of iron and B12 deficiency  Heme/Onc history: patient is a 47 year old female with a history of gastric bypass surgery in 2010. Prior to that she had a right lower extremity DVT in 2006 and was on Coumadin briefly. Prior gastric bypass surgery she had IVC filter placement. 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 There were attempts made to retrieve her IVC filter but that could not be done. She has also had a hypercoagulable workup in the past which did not reveal any cause for her DVT and PE. Currently patient is on 20 mg alternating with 15 mg of Coumadin every day but has had problems with INR control over the last couple of months.  She was seen by Dr. Tobi Bastos for iron deficiency anemia. CBC on 03/09/2017 showed white count of 9.9, H&H of 10.2/34.4 with an MCV of 73 and a platelet count of 533. CMP and TSH was within normal limits. Ferritin was low at 10. B12 was low at 117. Folate was normal. Celiac disease panel was negative. Intrinsic factor antibody was within normal limits. Urinalysis did not reveal any evidence of hematuria. Patient underwent EGD and colonoscopy which did not reveal any evidence of bleeding. There was evidence of gastric bypass. Patient has not had a capsule endoscopy yet.  Results of bloodwork from 04/23/2017 were as follows: CBC showed white count of 7. H&H 12.6/39.3 with an MCV of 79.3 and a platelet count of 417. Ferritin was low at 11. Serum iron was mildly  elevated at 197. Iron saturation was 46%. B12 was high normal at 277. Folate, copper and zinc within normal limits  She has received 2 doses of feraheme so far   Interval history-reports feeling fatigued.  Denies other complaints  ECOG PS- 0 Pain scale- 0   Review of systems- Review of Systems  Constitutional: Positive for malaise/fatigue. Negative for chills, fever and weight loss.  HENT: Negative for congestion, ear discharge and nosebleeds.   Eyes: Negative for blurred vision.  Respiratory: Negative for cough, hemoptysis, sputum production, shortness of breath and wheezing.   Cardiovascular: Negative for chest pain, palpitations, orthopnea and claudication.  Gastrointestinal: Negative for abdominal pain, blood in stool, constipation, diarrhea, heartburn, melena, nausea and vomiting.  Genitourinary: Negative for dysuria, flank pain, frequency, hematuria and urgency.  Musculoskeletal: Negative for back pain, joint pain and myalgias.  Skin: Negative for rash.  Neurological: Negative for dizziness, tingling, focal weakness, seizures, weakness and headaches.  Endo/Heme/Allergies: Does not bruise/bleed easily.  Psychiatric/Behavioral: Negative for depression and suicidal ideas. The patient does not have insomnia.       Allergies  Allergen Reactions  . Codeine Nausea Only     Past Medical History:  Diagnosis Date  . Anemia   . Anxiety   . Bipolar disorder (HCC)   . Cholelithiasis   . DVT (deep venous thrombosis) (HCC)   . GERD (gastroesophageal reflux disease)   . Kidney stones   . Pulmonary embolism (HCC)   . RLS (restless legs syndrome)  Past Surgical History:  Procedure Laterality Date  . BREAST BIOPSY Bilateral 2013/2014   neg  . CERVICAL BIOPSY  W/ LOOP ELECTRODE EXCISION  2007  . CHOLECYSTECTOMY    . COLONOSCOPY WITH PROPOFOL N/A 04/03/2017   Procedure: COLONOSCOPY WITH PROPOFOL;  Surgeon: Wyline Mood, MD;  Location: [facility] ENDOSCOPY;  Service: Endoscopy;   Laterality: N/A;  . ESOPHAGOGASTRODUODENOSCOPY (EGD) WITH PROPOFOL N/A 04/03/2017   Procedure: ESOPHAGOGASTRODUODENOSCOPY (EGD) WITH PROPOFOL;  Surgeon: Wyline Mood, MD;  Location: [facility] ENDOSCOPY;  Service: Endoscopy;  Laterality: N/A;  . GASTRIC BYPASS    . HERNIA REPAIR    . kidney stone removal    . KNEE ARTHROSCOPY    . TONSILLECTOMY      Social History   Socioeconomic History  . Marital status: Married    Spouse name: Not on file  . Number of children: 3  . Years of education: Not on file  . Highest education level: Not on file  Social Needs  . Financial resource strain: Not on file  . Food insecurity - worry: Not on file  . Food insecurity - inability: Not on file  . Transportation needs - medical: Not on file  . Transportation needs - non-medical: Not on file  Occupational History  . Occupation: Software engineer: [employer]  Tobacco Use  . Smoking status: Never Smoker  . Smokeless tobacco: Never Used  Substance and Sexual Activity  . Alcohol use: No    Alcohol/week: 0.0 oz  . Drug use: No  . Sexual activity: Yes    Partners: Male    Birth control/protection: IUD    Comment: mirena  Other Topics Concern  . Not on file  Social History Narrative  . Not on file    Family History  Problem Relation Age of Onset  . Diabetes Father   . Stroke Father   . Heart attack Father   . Kidney failure Father   . Hypertension Father   . Uterine cancer Maternal Grandmother   . Ulcers Maternal Grandmother   . Diabetes Maternal Grandfather   . Stroke Maternal Grandfather   . Breast cancer Paternal Grandmother   . Colon cancer Paternal Grandfather   . Hypertension Paternal Grandfather      Current Outpatient Medications:  .  ALPRAZolam (XANAX) 0.5 MG tablet, Take 0.5 mg by mouth See admin instructions. 0.5 mg  Three times daily and 1 mg at bedtime, Disp: , Rfl: 0 .  Cariprazine HCl (VRAYLAR) 1.5 MG CAPS, Take by mouth every morning., Disp: ,  Rfl:  .  ferrous sulfate 325 (65 FE) MG tablet, Take 325 mg by mouth daily with breakfast., Disp: , Rfl:  .  lamoTRIgine (LAMICTAL) 200 MG tablet, Take 200 mg by mouth daily., Disp: , Rfl:  .  levonorgestrel (MIRENA) 20 MCG/24HR IUD, by Intrauterine route., Disp: , Rfl:  .  Oxcarbazepine (TRILEPTAL) 300 MG tablet, Take 2 tablets by mouth daily., Disp: , Rfl: 0 .  propranolol (INDERAL) 20 MG tablet, Take 20 mg by mouth. , Disp: , Rfl: 1 .  vitamin B-12 (CYANOCOBALAMIN) 1000 MCG tablet, Take 1,000 mcg by mouth daily., Disp: , Rfl:  .  Vitamin K, Phytonadione, 100 MCG TABS, Take 100 mcg by mouth daily. Take with warfarin, Disp: , Rfl:  .  warfarin (COUMADIN) 5 MG tablet, TAKE UP TO 5 TABLETS DAILY AS DIRECTED, Disp: 150 tablet, Rfl: 4 .  busPIRone (BUSPAR) 15 MG tablet, 15 mg 2 (two) times daily. ,  Disp: , Rfl: 0 .  mupirocin ointment (BACTROBAN) 2 %, Apply 1 application topically 2 (two) times daily. (Patient not taking: Reported on 12/24/2017), Disp: 22 g, Rfl: 0  Physical exam:  Vitals:   12/24/17 0955  BP: 117/81  Pulse: 67  Resp: 18  Temp: (!) 96.6 F (35.9 C)  TempSrc: Tympanic  Weight: 189 lb 4.2 oz (85.9 kg)   Physical Exam  Constitutional: She is oriented to person, place, and time and well-developed, well-nourished, and in no distress.  HENT:  Head: Normocephalic and atraumatic.  Eyes: EOM are normal. Pupils are equal, round, and reactive to light.  Neck: Normal range of motion.  Cardiovascular: Normal rate, regular rhythm and normal heart sounds.  Pulmonary/Chest: Effort normal and breath sounds normal.  Abdominal: Soft. Bowel sounds are normal.  Neurological: She is alert and oriented to person, place, and time.  Skin: Skin is warm and dry.     CMP Latest Ref Rng & Units 03/09/2017  Glucose 65 - 99 mg/dL 91  BUN 6 - 24 mg/dL 8  Creatinine 1.610.57 - 0.961.00 mg/dL 0.450.70  Sodium 409134 - 811144 mmol/L 140  Potassium 3.5 - 5.2 mmol/L 4.0  Chloride 96 - 106 mmol/L 100  CO2 18 - 29  mmol/L 22  Calcium 8.7 - 10.2 mg/dL 9.1  Total Protein 6.0 - 8.5 g/dL 6.8  Total Bilirubin 0.0 - 1.2 mg/dL 0.2  Alkaline Phos 39 - 117 IU/L 86  AST 0 - 40 IU/L 24  ALT 0 - 32 IU/L 15   CBC Latest Ref Rng & Units 12/21/2017  WBC 3.6 - 11.0 K/uL 6.9  Hemoglobin 12.0 - 16.0 g/dL 91.414.8  Hematocrit 78.235.0 - 47.0 % 44.3  Platelets 150 - 440 K/uL 294     Assessment and plan- Patient is a 47 y.o. female with history of iron and B12 deficiency anemia secondary to gastric bypass  Recent iron studies from 12/21/2017 were within normal limits.  She does not require any iron at this time.  She is also no longer anemic.  However her B12 levels are still low at 140.  She has only received 2 doses of B12 in October and then on 12/21/2017 so far.  Patient states that she forgot to come for her appointments in the interim.  Given that her B12 levels are significantly low she will proceed with weekly B12 x4 starting today followed by monthly for the next 6 months.  Repeat CBC ferritin and iron studies and B12 in 3 and 6 months and I will see her back in 6 months   Visit Diagnosis 1. B12 deficiency   2. Other iron deficiency anemia   3. Bariatric surgery status      Dr. Owens SharkArchana Cayce Paschal, MD, MPH [facility] Pager- [phone] 12/24/2017 10:14 AM

## 2017-12-31 ENCOUNTER — Inpatient Hospital Stay: Payer: BLUE CROSS/BLUE SHIELD

## 2017-12-31 DIAGNOSIS — D509 Iron deficiency anemia, unspecified: Secondary | ICD-10-CM | POA: Diagnosis not present

## 2017-12-31 MED ORDER — CYANOCOBALAMIN 1000 MCG/ML IJ SOLN
INTRAMUSCULAR | Status: AC
  Filled 2017-12-31: qty 1

## 2017-12-31 MED ORDER — CYANOCOBALAMIN 1000 MCG/ML IJ SOLN
1000.0000 ug | Freq: Once | INTRAMUSCULAR | Status: AC
  Administered 2017-12-31: 1000 ug via INTRAMUSCULAR

## 2018-01-07 ENCOUNTER — Inpatient Hospital Stay: Payer: BLUE CROSS/BLUE SHIELD

## 2018-01-07 VITALS — BP 103/69 | HR 66 | Temp 99.0°F | Resp 18

## 2018-01-07 DIAGNOSIS — E538 Deficiency of other specified B group vitamins: Secondary | ICD-10-CM

## 2018-01-07 DIAGNOSIS — D509 Iron deficiency anemia, unspecified: Secondary | ICD-10-CM

## 2018-01-07 MED ORDER — CYANOCOBALAMIN 1000 MCG/ML IJ SOLN
1000.0000 ug | Freq: Once | INTRAMUSCULAR | Status: AC
Start: 2018-01-07 — End: 2018-01-07
  Administered 2018-01-07: 1000 ug via INTRAMUSCULAR

## 2018-01-14 ENCOUNTER — Inpatient Hospital Stay: Payer: BLUE CROSS/BLUE SHIELD

## 2018-01-15 ENCOUNTER — Inpatient Hospital Stay: Payer: BLUE CROSS/BLUE SHIELD

## 2018-01-15 DIAGNOSIS — E538 Deficiency of other specified B group vitamins: Secondary | ICD-10-CM

## 2018-01-15 DIAGNOSIS — D509 Iron deficiency anemia, unspecified: Secondary | ICD-10-CM

## 2018-01-15 MED ORDER — CYANOCOBALAMIN 1000 MCG/ML IJ SOLN
1000.0000 ug | Freq: Once | INTRAMUSCULAR | Status: AC
  Administered 2018-01-15: 1000 ug via INTRAMUSCULAR
  Filled 2018-01-15: qty 1

## 2018-01-24 ENCOUNTER — Other Ambulatory Visit: Payer: Self-pay | Admitting: Family Medicine

## 2018-01-24 DIAGNOSIS — I2782 Chronic pulmonary embolism: Secondary | ICD-10-CM

## 2018-02-11 ENCOUNTER — Inpatient Hospital Stay: Payer: BLUE CROSS/BLUE SHIELD | Attending: Oncology

## 2018-02-11 DIAGNOSIS — E538 Deficiency of other specified B group vitamins: Secondary | ICD-10-CM | POA: Insufficient documentation

## 2018-02-12 ENCOUNTER — Inpatient Hospital Stay: Payer: BLUE CROSS/BLUE SHIELD

## 2018-02-12 VITALS — BP 108/75 | HR 65 | Temp 97.8°F | Resp 18

## 2018-02-12 DIAGNOSIS — E538 Deficiency of other specified B group vitamins: Secondary | ICD-10-CM | POA: Diagnosis present

## 2018-02-12 MED ORDER — CYANOCOBALAMIN 1000 MCG/ML IJ SOLN
1000.0000 ug | Freq: Once | INTRAMUSCULAR | Status: AC
  Administered 2018-02-12: 1000 ug via INTRAMUSCULAR

## 2018-03-04 ENCOUNTER — Other Ambulatory Visit: Payer: BLUE CROSS/BLUE SHIELD

## 2018-03-11 ENCOUNTER — Ambulatory Visit: Payer: BLUE CROSS/BLUE SHIELD

## 2018-03-11 ENCOUNTER — Other Ambulatory Visit: Payer: BLUE CROSS/BLUE SHIELD

## 2018-04-08 ENCOUNTER — Inpatient Hospital Stay: Payer: BLUE CROSS/BLUE SHIELD | Attending: Oncology

## 2018-04-10 ENCOUNTER — Ambulatory Visit: Payer: BLUE CROSS/BLUE SHIELD

## 2018-04-10 DIAGNOSIS — Z86711 Personal history of pulmonary embolism: Secondary | ICD-10-CM | POA: Diagnosis not present

## 2018-04-10 LAB — POCT INR
INR: 1.3
Prothrombin Time: 15.3

## 2018-04-10 NOTE — Patient Instructions (Signed)
Description   Dx: Pulmonary Embolism I26.99 Current Dose: 15mg  everyday, except 10mg  on Monday and Friday  Today's changes: 15mg  qd except Mon 10mg  Recheck: 2 weeks

## 2018-04-24 ENCOUNTER — Ambulatory Visit: Payer: Self-pay | Admitting: Family Medicine

## 2018-04-26 ENCOUNTER — Ambulatory Visit (INDEPENDENT_AMBULATORY_CARE_PROVIDER_SITE_OTHER): Payer: BLUE CROSS/BLUE SHIELD

## 2018-04-26 ENCOUNTER — Ambulatory Visit: Payer: Self-pay | Admitting: Family Medicine

## 2018-04-26 DIAGNOSIS — Z86711 Personal history of pulmonary embolism: Secondary | ICD-10-CM | POA: Diagnosis not present

## 2018-04-26 LAB — POCT INR
INR: 2.6
PT: 31.8

## 2018-04-26 NOTE — Patient Instructions (Addendum)
  Description   15 mg daily except 10 mg on Monday

## 2018-05-06 ENCOUNTER — Ambulatory Visit: Payer: BLUE CROSS/BLUE SHIELD

## 2018-05-24 ENCOUNTER — Ambulatory Visit: Payer: BLUE CROSS/BLUE SHIELD | Admitting: Emergency Medicine

## 2018-05-24 DIAGNOSIS — Z86711 Personal history of pulmonary embolism: Secondary | ICD-10-CM

## 2018-05-24 LAB — POCT INR
INR: 3.1 — AB (ref 2.0–3.0)
PT: 36.7

## 2018-05-24 NOTE — Patient Instructions (Signed)
Description   Hold for 2 days then continue 15 mg daily except 10 mg on Monday

## 2018-06-03 ENCOUNTER — Other Ambulatory Visit: Payer: BLUE CROSS/BLUE SHIELD

## 2018-06-03 ENCOUNTER — Inpatient Hospital Stay: Payer: BLUE CROSS/BLUE SHIELD

## 2018-06-03 ENCOUNTER — Inpatient Hospital Stay: Payer: BLUE CROSS/BLUE SHIELD | Attending: Oncology | Admitting: Oncology

## 2018-06-21 ENCOUNTER — Ambulatory Visit: Payer: Self-pay

## 2018-06-26 ENCOUNTER — Ambulatory Visit: Payer: BLUE CROSS/BLUE SHIELD

## 2018-06-26 DIAGNOSIS — Z86711 Personal history of pulmonary embolism: Secondary | ICD-10-CM

## 2018-06-26 LAB — POCT INR
INR: 2.8 (ref 2.0–3.0)
PT: 33.7

## 2018-06-26 NOTE — Progress Notes (Signed)
Patient reports that her menstrual cycle was heavier than usual, and lasted 8 days. She reports that this is unusual for her. She reports that her period is starting to get lighter. Per Dr. Sherrie MustacheFisher, continue to monitor. Advised patient if she continues to bleed, contact us for an appt for evaluation and to check blood counts.

## 2018-07-01 ENCOUNTER — Ambulatory Visit: Payer: BLUE CROSS/BLUE SHIELD | Admitting: Family Medicine

## 2018-07-01 ENCOUNTER — Encounter: Payer: Self-pay | Admitting: Family Medicine

## 2018-07-01 VITALS — BP 120/72 | HR 61 | Temp 97.9°F | Resp 16 | Wt 167.2 lb

## 2018-07-01 DIAGNOSIS — T3995XA Adverse effect of unspecified nonopioid analgesic, antipyretic and antirheumatic, initial encounter: Secondary | ICD-10-CM | POA: Diagnosis not present

## 2018-07-01 DIAGNOSIS — R634 Abnormal weight loss: Secondary | ICD-10-CM

## 2018-07-01 DIAGNOSIS — G4440 Drug-induced headache, not elsewhere classified, not intractable: Secondary | ICD-10-CM

## 2018-07-01 MED ORDER — KETOROLAC TROMETHAMINE 60 MG/2ML IM SOLN
60.0000 mg | Freq: Once | INTRAMUSCULAR | Status: AC
Start: 2018-07-01 — End: 2018-07-01
  Administered 2018-07-01: 60 mg via INTRAMUSCULAR

## 2018-07-01 NOTE — Progress Notes (Signed)
  Subjective:     Patient ID: Kristen [name], female   DOB: 07/23/1971, 47 y.o.   MRN: [id number] Chief Complaint  Patient presents with  . Headache    Patient coms into office today with complaint of headache on the left side of head of rhte past 2.5 weeks. Patient describes headache as pounding and pressure, she denies symptoms of nausea, vomiting but states at times she get blurred vision. Patient has been taking otc Ibuprofen, Tylenol, Excedrin and Tylenol Sinus for relief.    HPI States she has no hx of headache syndromes but does get occasional tension headaches. States the left side of her face feels swollen though does not see any swelling. Reports she was a bit stressed over her husband's job prior to onset. Massage early on seemed to help but headache returned. Has been taking analgesics daily but none today. States mental health is stable (Dr. Jennelle Humanottle) on present medications. Reports regular dental visits but no recent eye exam.  Review of Systems  Constitutional: Positive for unexpected weight change (loss of 22# unintentionally since January of 2019.).       Objective:   Physical Exam  Constitutional: She appears well-developed and well-nourished. No distress.  HENT:  TM's intact without inflammation. No left sided tooth tenderness on percussion.  Eyes: Pupils are equal, round, and reactive to light. EOM are normal.  Neck: Carotid bruit is not present.  Musculoskeletal:  Grip strength 5/5 symmetrically. No TMJ tenderness  Neurological: Coordination ( Romberg negative, Finger to Nose and Heel to  toe WNL) normal.       Assessment:    1. Analgesic rebound headache - ketorolac (TORADOL) injection 60 mg  2. Weight loss, non-intentional - Comprehensive metabolic panel - T4, free - TSH    Plan:    Encouraged eye exam. Minimize further pain medication. Further f/u pending lab results.

## 2018-07-01 NOTE — Patient Instructions (Addendum)
Consider getting an eye exam. Stay off further pain medication if possible. Let me know if headache not resolving or worsening. We will call you with the lab results.

## 2018-07-02 ENCOUNTER — Other Ambulatory Visit: Payer: Self-pay | Admitting: Family Medicine

## 2018-07-02 LAB — COMPREHENSIVE METABOLIC PANEL
A/G RATIO: 1.5 (ref 1.2–2.2)
ALK PHOS: 69 IU/L (ref 39–117)
ALT: 12 IU/L (ref 0–32)
AST: 15 IU/L (ref 0–40)
Albumin: 3.4 g/dL — ABNORMAL LOW (ref 3.5–5.5)
BUN / CREAT RATIO: 14 (ref 9–23)
BUN: 8 mg/dL (ref 6–24)
Bilirubin Total: 0.5 mg/dL (ref 0.0–1.2)
CO2: 21 mmol/L (ref 20–29)
Calcium: 8.3 mg/dL — ABNORMAL LOW (ref 8.7–10.2)
Chloride: 107 mmol/L — ABNORMAL HIGH (ref 96–106)
Creatinine, Ser: 0.56 mg/dL — ABNORMAL LOW (ref 0.57–1.00)
GFR calc Af Amer: 129 mL/min/{1.73_m2} (ref >59)
GFR, EST NON AFRICAN AMERICAN: 112 mL/min/{1.73_m2} (ref >59)
GLOBULIN, TOTAL: 2.2 g/dL (ref 1.5–4.5)
Glucose: 88 mg/dL (ref 65–99)
POTASSIUM: 3.8 mmol/L (ref 3.5–5.2)
SODIUM: 139 mmol/L (ref 134–144)
Total Protein: 5.6 g/dL — ABNORMAL LOW (ref 6.0–8.5)

## 2018-07-02 LAB — TSH: TSH: 0.569 u[IU]/mL (ref 0.450–4.500)

## 2018-07-02 LAB — T4, FREE: FREE T4: 1.01 ng/dL (ref 0.82–1.77)

## 2018-07-02 MED ORDER — HYDROCODONE-ACETAMINOPHEN 5-325 MG PO TABS: ORAL_TABLET | ORAL | 0 refills | Status: DC

## 2018-07-03 ENCOUNTER — Telehealth: Payer: Self-pay | Admitting: Family Medicine

## 2018-07-03 NOTE — Telephone Encounter (Signed)
Per Bob's note under lab results 07/01/2018, pt was to follow up with Dr. Sherrie MustacheFisher if not improved.  Made apt for 07/04/2018 at 11:20.    I advised pt to go to the ER if she experience new or worsening symptoms. She agreed.   Thanks,   -Vernona RiegerLaura

## 2018-07-03 NOTE — Telephone Encounter (Signed)
[facility].  She was given a shot and medication.  She was told to call back if this didn't help  She is still having a constant headache.  It is about the same but she said it actually hurts to touch the left side of the head.  CB# [phone]  Thanks Barth Kirksteri

## 2018-07-04 ENCOUNTER — Ambulatory Visit: Payer: BLUE CROSS/BLUE SHIELD | Admitting: Family Medicine

## 2018-07-04 ENCOUNTER — Encounter: Payer: Self-pay | Admitting: Family Medicine

## 2018-07-04 VITALS — BP 100/70 | HR 61 | Temp 98.1°F | Resp 16 | Wt 167.0 lb

## 2018-07-04 DIAGNOSIS — S161XXA Strain of muscle, fascia and tendon at neck level, initial encounter: Secondary | ICD-10-CM

## 2018-07-04 DIAGNOSIS — G44209 Tension-type headache, unspecified, not intractable: Secondary | ICD-10-CM

## 2018-07-04 MED ORDER — CYCLOBENZAPRINE HCL 5 MG PO TABS: 5.0000 mg | ORAL_TABLET | Freq: Three times a day (TID) | ORAL | 1 refills | Status: DC | PRN

## 2018-07-04 MED ORDER — PREDNISONE 10 MG PO TABS: ORAL_TABLET | ORAL | 0 refills | Status: AC

## 2018-07-04 NOTE — Progress Notes (Signed)
Patient: Kristen [name] Female    DOB: [birth year]   47 y.o.   MRN: [id number] Visit Date: 07/04/2018  Today's Provider: Mila Merry, MD   No chief complaint on file.  Subjective:    HPI  Headache  Last office visit 07/01/2018-seen by Toni Arthurs. Patient was given rx for on dose of hydrocodone and advised to follow up with Dr. Sherrie Mustache if not better. Patient states Toradol injection she received did not help with headache and neither did the hydrocodone. Patient states she is still having headache.   Headache started after getting over a cold about 3 weeks ago. It is on the left side of scalp and extends across back of head and into neck on both sides, but worse on left. Sometimes radiates into her face and jaw. Is aggravated by turning neck. Has not taken oral NSAIDs due to being on warfarin. She did have a message a few days after the headache which provided temporary relief. No visual changes, numbness, tingling, or focal weakness. No chills or sweats, but does feel fatigue. No other muscle aches or pains.    Allergies  Allergen Reactions  . Codeine Nausea Only     Current Outpatient Medications:  .  ALPRAZolam (XANAX) 0.5 MG tablet, Take 0.5 mg by mouth See admin instructions. 0.5 mg  Three times daily and 1 mg at bedtime, Disp: , Rfl: 0 .  buPROPion (WELLBUTRIN XL) 150 MG 24 hr tablet, , Disp: , Rfl: 2 .  ferrous sulfate 325 (65 FE) MG tablet, Take 325 mg by mouth daily with breakfast., Disp: , Rfl:  .  HYDROcodone-acetaminophen (NORCO/VICODIN) 5-325 MG tablet, Take one today for headache. May repeat in 4 hours if headache still present, Disp: 2 tablet, Rfl: 0 .  lamoTRIgine (LAMICTAL) 200 MG tablet, Take 200 mg by mouth daily., Disp: , Rfl:  .  LATUDA 80 MG TABS tablet, TK 1 T PO  QPM, Disp: , Rfl: 1 .  levonorgestrel (MIRENA) 20 MCG/24HR IUD, by Intrauterine route., Disp: , Rfl:  .  Oxcarbazepine (TRILEPTAL) 300 MG tablet, Take 2 tablets by mouth daily., Disp: , Rfl:  0 .  propranolol (INDERAL) 20 MG tablet, Take 20 mg by mouth. , Disp: , Rfl: 1 .  Vitamin K, Phytonadione, 100 MCG TABS, Take 100 mcg by mouth daily. Take with warfarin, Disp: , Rfl:  .  warfarin (COUMADIN) 5 MG tablet, TAKE UP TO 5 TABLETS DAILY AS DIRECTED, Disp: 150 tablet, Rfl: 4  Review of Systems  Constitutional: Negative for appetite change, chills, fatigue and fever.  Respiratory: Negative for chest tightness and shortness of breath.   Cardiovascular: Negative for chest pain and palpitations.  Gastrointestinal: Negative for abdominal pain, nausea and vomiting.  Neurological: Negative for dizziness and weakness.    Social History   Tobacco Use  . Smoking status: Never Smoker  . Smokeless tobacco: Never Used  Substance Use Topics  . Alcohol use: No    Alcohol/week: 0.0 oz   Objective:   BP 100/70 (BP Location: Right Arm, Patient Position: Sitting, Cuff Size: Normal)   Pulse 61   Temp 98.1 F (36.7 C) (Oral)   Resp 16   Wt 167 lb (75.8 kg)   SpO2 98%   BMI 24.66 kg/m     Physical Exam   General Appearance:    Alert, cooperative, no distress  Eyes:    PERRL, conjunctiva/corneas clear, EOM's intact       Lungs:  Clear to auscultation bilaterally, respirations unlabored  Heart:    Regular rate and rhythm  Neurologic:   Awake, alert, oriented x 3. CN II-XII grossly intact. No apparent focal neurological defect.   MS:   Diffusely tender across base of neck of neck with headache exacerbated by palpation and movement of neck through limits of ROM. Tender across temples and parietal scalp. No lesions or other gross deformities.        Assessment & Plan:     1. Strain of neck muscle, initial encounter  - predniSONE (DELTASONE) 10 MG tablet; 6 tablets for 1 day, then 5 for 1 day, then 4 for 1 day, then 3 for 1 day, then 2 for 1 day then 1 for 1 day.  Dispense: 21 tablet; Refill: 0 - cyclobenzaprine (FLEXERIL) 5 MG tablet; Take 1-2 tablets (5-10 mg total) by mouth 3  (three) times daily as needed for muscle spasms.  Dispense: 30 tablet; Refill: 1  2. Tension headache No signs or symptoms of CNS origin.  Not candidate for oral NSAIDs. No significant relief from opioids. Expect improvement with prednisone and muscle relaxer in a few days. Call otherwise.        Mila Merryonald Jamarie Joplin, MD  [facility]

## 2018-07-24 ENCOUNTER — Ambulatory Visit: Payer: BLUE CROSS/BLUE SHIELD

## 2018-07-24 DIAGNOSIS — Z86711 Personal history of pulmonary embolism: Secondary | ICD-10-CM | POA: Diagnosis not present

## 2018-07-24 LAB — POCT INR
INR: 2.7 (ref 2.0–3.0)
PT: 32.5

## 2018-07-24 NOTE — Patient Instructions (Signed)
Description   Dx: History of Pulmonary Embolism Current Coumadin Dose : 15mg  everyday, except 10mg  on Monday  INR: 2.7 PT: 32.5 Today's Changes: NO CHANGE Recheck: 4 weeks

## 2018-07-24 NOTE — Progress Notes (Deleted)
       Patient: Filomena JunglingChrissy M Overall Female    DOB: 07/19/1971   47 y.o.   MRN: [id number] Visit Date: 07/24/2018  Today's Provider: Mila Merryonald Fisher, MD   No chief complaint on file.  Subjective:    HPI     Allergies  Allergen Reactions  . Codeine Nausea Only     Current Outpatient Medications:  .  ALPRAZolam (XANAX) 0.5 MG tablet, Take 0.5 mg by mouth See admin instructions. 0.5 mg  Three times daily and 1 mg at bedtime, Disp: , Rfl: 0 .  buPROPion (WELLBUTRIN XL) 150 MG 24 hr tablet, , Disp: , Rfl: 2 .  cyclobenzaprine (FLEXERIL) 5 MG tablet, Take 1-2 tablets (5-10 mg total) by mouth 3 (three) times daily as needed for muscle spasms., Disp: 30 tablet, Rfl: 1 .  ferrous sulfate 325 (65 FE) MG tablet, Take 325 mg by mouth daily with breakfast., Disp: , Rfl:  .  lamoTRIgine (LAMICTAL) 200 MG tablet, Take 200 mg by mouth daily., Disp: , Rfl:  .  LATUDA 80 MG TABS tablet, TK 1 T PO  QPM, Disp: , Rfl: 1 .  levonorgestrel (MIRENA) 20 MCG/24HR IUD, by Intrauterine route., Disp: , Rfl:  .  Oxcarbazepine (TRILEPTAL) 300 MG tablet, Take 2 tablets by mouth daily., Disp: , Rfl: 0 .  propranolol (INDERAL) 20 MG tablet, Take 20 mg by mouth. , Disp: , Rfl: 1 .  Vitamin K, Phytonadione, 100 MCG TABS, Take 100 mcg by mouth daily. Take with warfarin, Disp: , Rfl:  .  warfarin (COUMADIN) 5 MG tablet, TAKE UP TO 5 TABLETS DAILY AS DIRECTED, Disp: 150 tablet, Rfl: 4  Review of Systems  Constitutional: Negative for appetite change, chills, fatigue and fever.  Respiratory: Negative for chest tightness and shortness of breath.   Cardiovascular: Negative for chest pain and palpitations.  Gastrointestinal: Negative for abdominal pain, nausea and vomiting.  Neurological: Negative for dizziness and weakness.    Social History   Tobacco Use  . Smoking status: Never Smoker  . Smokeless tobacco: Never Used  Substance Use Topics  . Alcohol use: No    Alcohol/week: 0.0 oz   Objective:   There were no  vitals taken for this visit. There were no vitals filed for this visit.   Physical Exam      Assessment & Plan:           Mila Merryonald Fisher, MD  [facility]

## 2018-08-17 IMAGING — US US EXTREM LOW VENOUS*R*
1 series · 13 of 24 positions shown · non-contrast
Comparison: None.

CLINICAL DATA: Initial evaluation for acute right calf pain for 1
day. History of DVT, PE.



[Series 1: us extrem low venous*right* · 0.07mm/px · 13 of 44 slices shown]
[im 1/44]
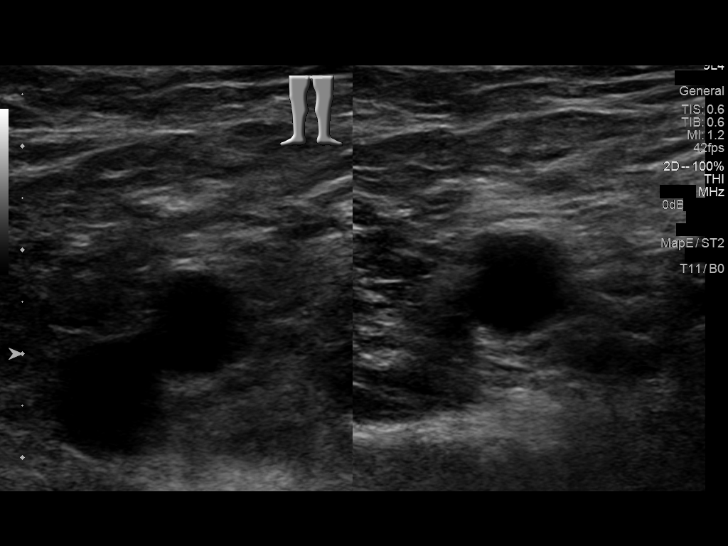
[im 4/44]
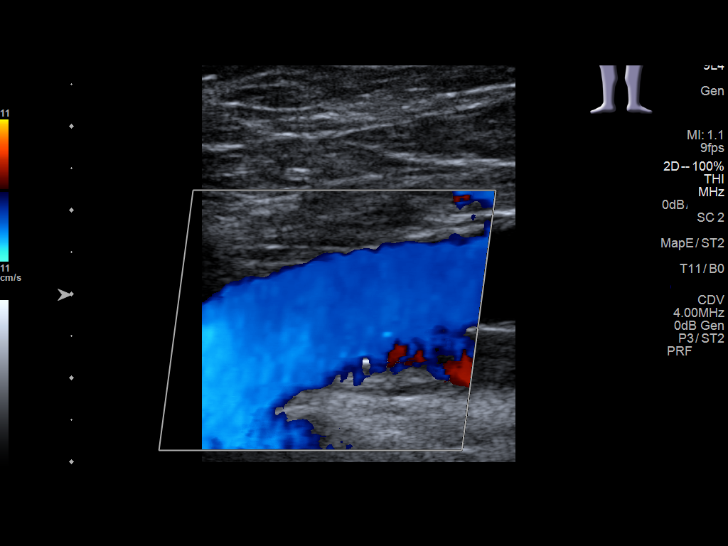
[im 8/44]
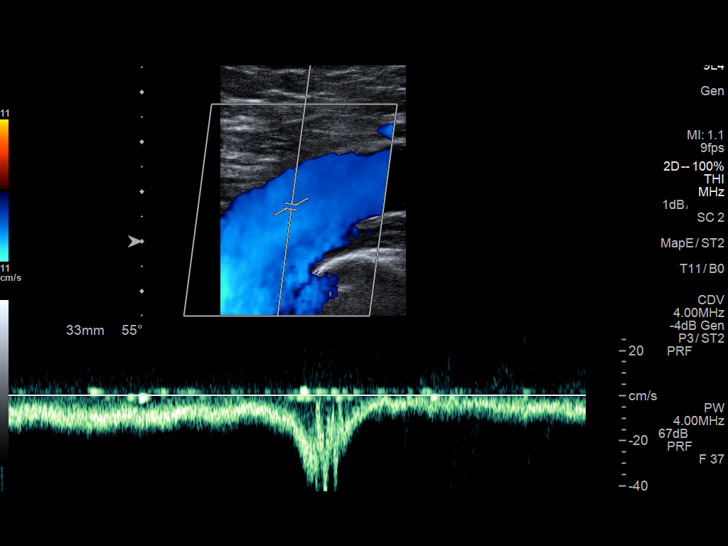
[im 12/44]
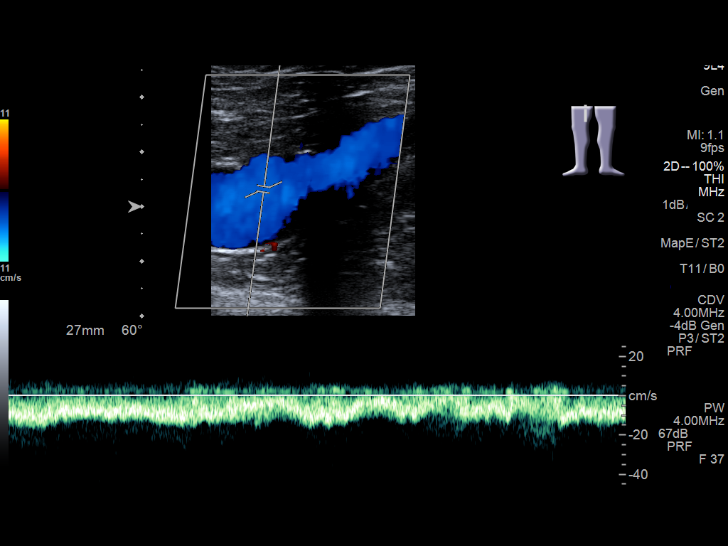
[im 15/44]
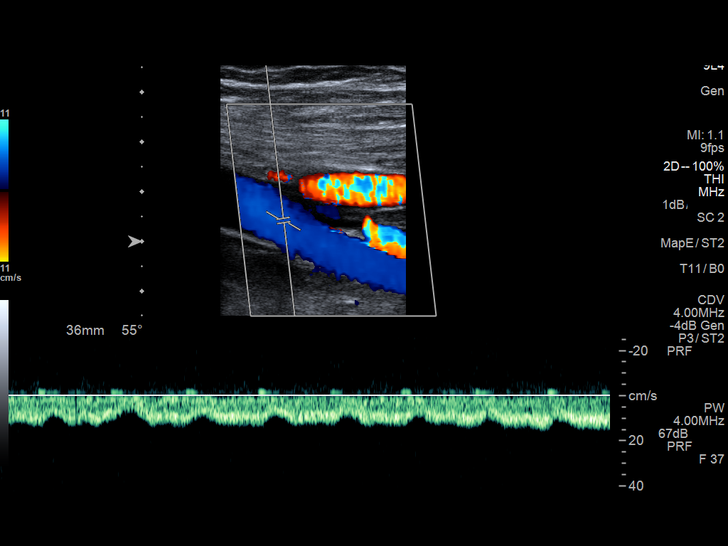
[im 19/44]
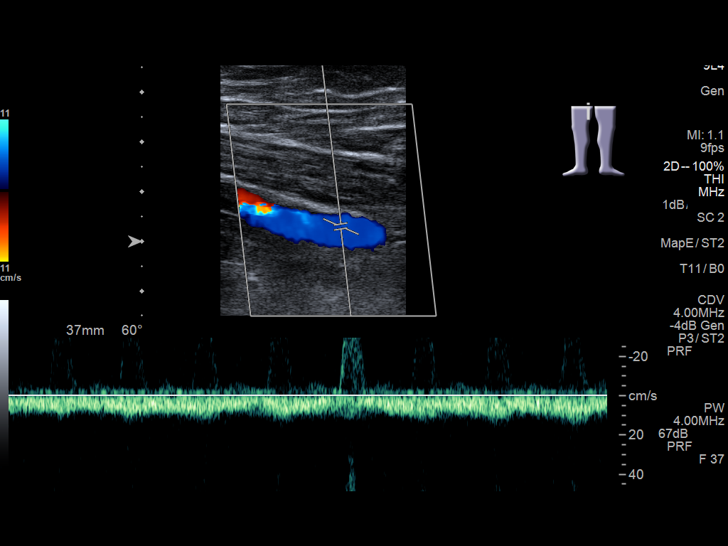
[im 23/44]
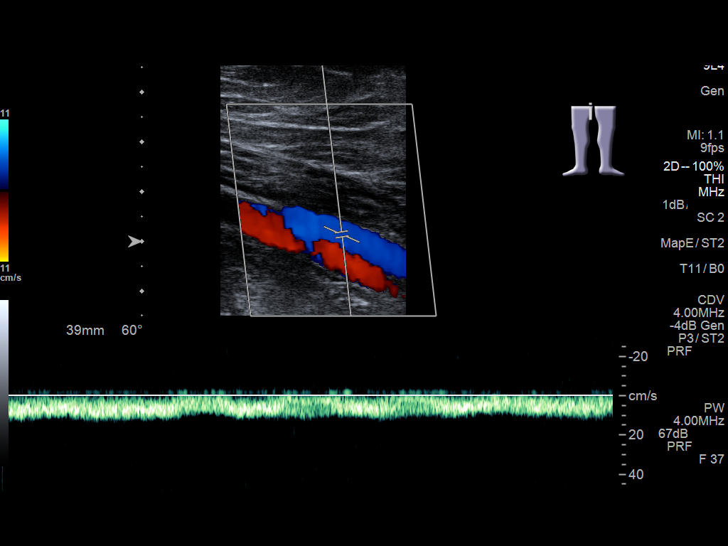
[im 25/44]
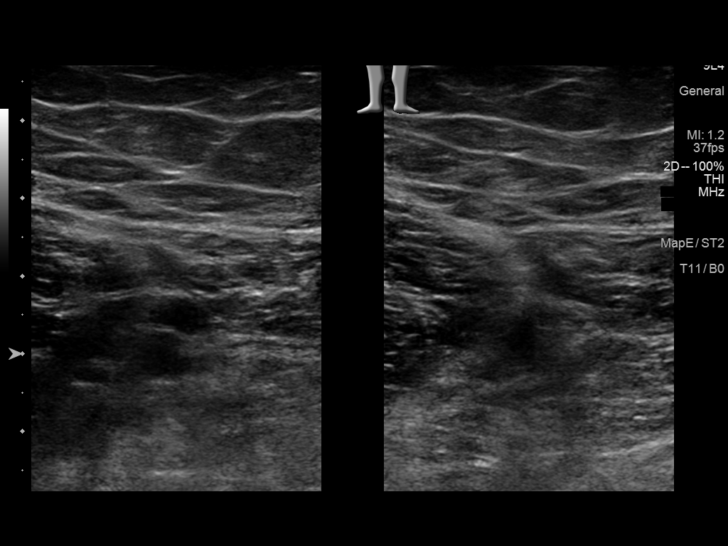
[im 29/44]
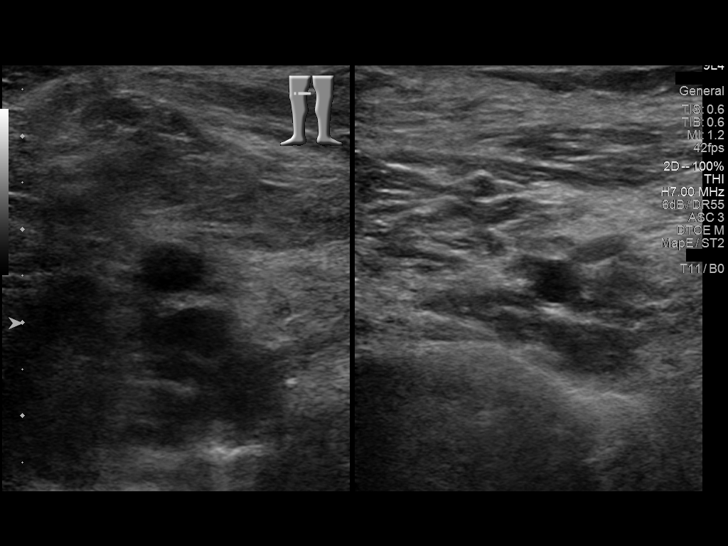
[im 32/44]
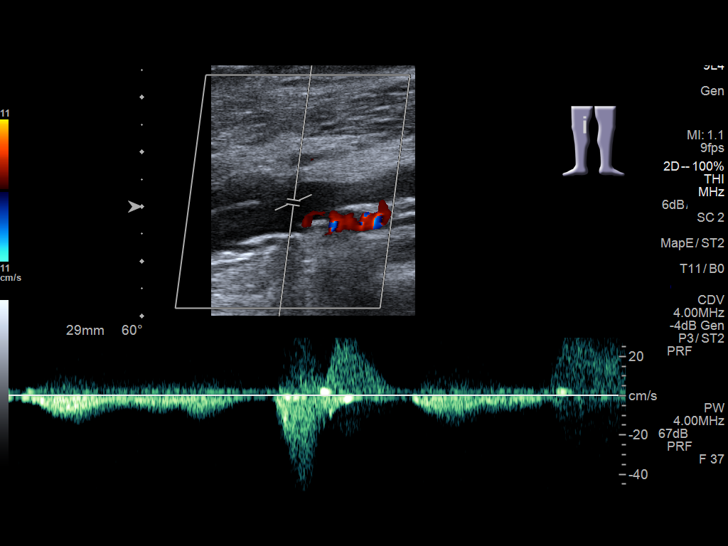
[im 36/44]
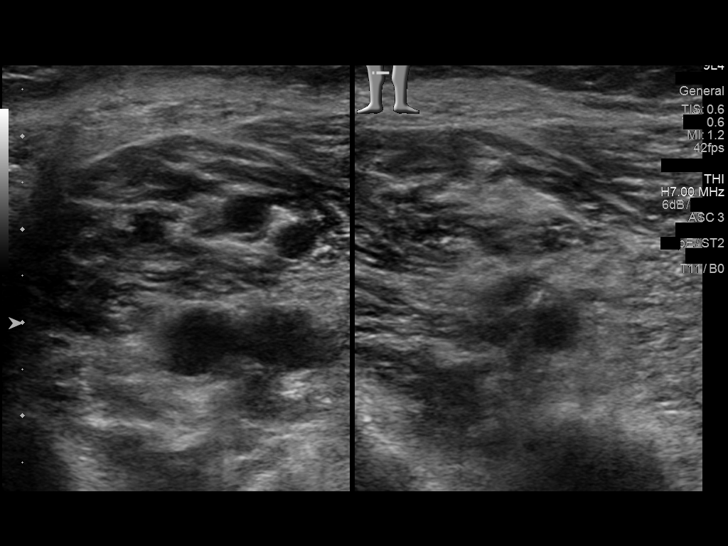
[im 40/44]
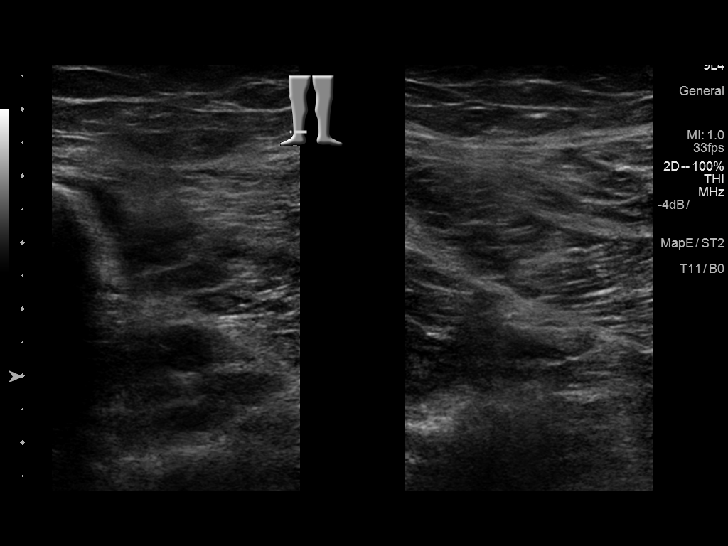
[im 44/44]
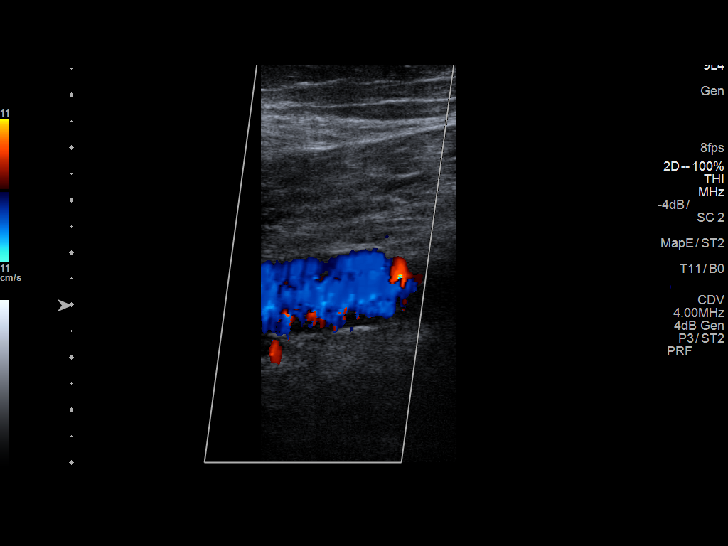

[13 of 24 positions shown; findings below may reference images not displayed]

FINDINGS: Contralateral Common Femoral Vein: Respiratory phasicity is normal
and symmetric with the symptomatic side. No evidence of thrombus.
Normal compressibility.

Common Femoral Vein: No evidence of thrombus. Normal
compressibility, respiratory phasicity and response to augmentation.

Saphenofemoral Junction: No evidence of thrombus. Normal
compressibility and flow on color Doppler imaging.

Profunda Femoral Vein: No evidence of thrombus. Normal
compressibility and flow on color Doppler imaging.

Femoral Vein: No evidence of thrombus. Normal compressibility,
respiratory phasicity and response to augmentation.

Popliteal Vein: No evidence of thrombus. Normal compressibility,
respiratory phasicity and response to augmentation.

Calf Veins: No evidence of thrombus. Normal compressibility and flow
on color Doppler imaging. Right peroneal vein not visualized.

Superficial Great Saphenous Vein: No evidence of thrombus. Normal
compressibility and flow on color Doppler imaging.

Venous Reflux:  None.

Other Findings:  None.
IMPRESSION: No evidence of deep venous thrombosis.

## 2018-08-21 ENCOUNTER — Ambulatory Visit: Payer: BLUE CROSS/BLUE SHIELD

## 2018-08-21 DIAGNOSIS — Z86711 Personal history of pulmonary embolism: Secondary | ICD-10-CM | POA: Diagnosis not present

## 2018-08-21 LAB — POCT INR
INR: 2.2 (ref 2.0–3.0)
PT: 26.6

## 2018-08-21 NOTE — Patient Instructions (Signed)
Description   Dx: History of Pulmonary Embolism Current Coumadin Dose: 15mg  everyday, except 10mg  on Monday INR: 2.2 PT: 26.6 Today's Changes: NO CHANGE  Recheck: 4 weeks

## 2018-09-18 ENCOUNTER — Ambulatory Visit: Payer: Self-pay | Admitting: Family Medicine

## 2018-09-23 ENCOUNTER — Other Ambulatory Visit: Payer: Self-pay

## 2018-09-23 MED ORDER — LATUDA 80 MG PO TABS: ORAL_TABLET | ORAL | 1 refills | Status: DC

## 2018-09-25 ENCOUNTER — Ambulatory Visit: Payer: BLUE CROSS/BLUE SHIELD

## 2018-09-25 ENCOUNTER — Other Ambulatory Visit: Payer: Self-pay | Admitting: Psychiatry

## 2018-09-25 DIAGNOSIS — Z86711 Personal history of pulmonary embolism: Secondary | ICD-10-CM | POA: Diagnosis not present

## 2018-09-25 LAB — POCT INR: INR: 4 — AB (ref 2.0–3.0)

## 2018-09-25 NOTE — Patient Instructions (Signed)
Description   Dx: Pulmonary Embolism Current Coumadin dose: 15mg  everyday, except 10mg  on Monday INR: 4.0 PT: 47.4 Today's Changes: Hold for 2 days, then start taking 10mg  on Monday, Wednesday, Friday and 15mg  all other days Recheck: 2 weeks

## 2018-09-27 ENCOUNTER — Encounter: Payer: Self-pay | Admitting: Emergency Medicine

## 2018-09-27 DIAGNOSIS — F411 Generalized anxiety disorder: Secondary | ICD-10-CM

## 2018-09-27 DIAGNOSIS — F410 Panic disorder [episodic paroxysmal anxiety] without agoraphobia: Secondary | ICD-10-CM | POA: Insufficient documentation

## 2018-10-01 ENCOUNTER — Encounter: Payer: BLUE CROSS/BLUE SHIELD | Admitting: Obstetrics and Gynecology

## 2018-10-03 ENCOUNTER — Other Ambulatory Visit: Payer: Self-pay | Admitting: Psychiatry

## 2018-10-04 ENCOUNTER — Ambulatory Visit: Payer: BLUE CROSS/BLUE SHIELD | Admitting: Family Medicine

## 2018-10-04 ENCOUNTER — Other Ambulatory Visit: Payer: Self-pay | Admitting: Psychiatry

## 2018-10-04 ENCOUNTER — Encounter: Payer: Self-pay | Admitting: Family Medicine

## 2018-10-04 VITALS — BP 104/62 | HR 64 | Temp 98.3°F | Resp 16 | Wt 156.0 lb

## 2018-10-04 DIAGNOSIS — R634 Abnormal weight loss: Secondary | ICD-10-CM

## 2018-10-04 DIAGNOSIS — R109 Unspecified abdominal pain: Secondary | ICD-10-CM

## 2018-10-04 LAB — POCT URINALYSIS DIPSTICK
Bilirubin, UA: NEGATIVE
GLUCOSE UA: NEGATIVE
Nitrite, UA: NEGATIVE
Protein, UA: NEGATIVE
SPEC GRAV UA: 1.01 (ref 1.010–1.025)
UROBILINOGEN UA: 0.2 U/dL
pH, UA: 6.5 (ref 5.0–8.0)

## 2018-10-04 NOTE — Progress Notes (Signed)
Patient: Kristen [name] Female    DOB: 04-25-1971   47 y.o.   MRN: [id number] Visit Date: 10/04/2018  Today's Provider: Lelon Huh, MD   Chief Complaint  Patient presents with  . Weight Loss    unexplained  . Urinary Tract Infection   Subjective:    HPI  Patient comes in today c/o unexplained weight loss that has been ongoing for the last several months. At the beginning of the year her weight was 186lbs. She now weighs 156lbs. Patient denies changing her eating habits. She did have gastric bypass surgery about 10 years ago and states her lowest weight was in the 150s, but she had gradually gone up until this year.   Patient also mentions that she may have a UTI. She has not used anything OTC to help treat her symptoms. She has flank pain and urinary frequency that has been ongoing for about 1 month. States she has had several kidney stones in the past, but current pain is a little different. If frequently shoots into her upper abdomen. No nausea or vomiting. No change in bowels.   Wt Readings from Last 20 Encounters:  10/04/18 156 lb (70.8 kg)  07/04/18 167 lb (75.8 kg)  07/01/18 167 lb 3.2 oz (75.8 kg)  12/24/17 189 lb 4.2 oz (85.9 kg)  09/27/17 186 lb 3.2 oz (84.5 kg)  09/21/17 194 lb (88 kg)  06/25/17 191 lb 12.8 oz (87 kg)  04/23/17 194 lb 12.4 oz (88.3 kg)  04/03/17 196 lb (88.9 kg)  03/28/17 196 lb (88.9 kg)  03/09/17 194 lb (88 kg)  03/05/17 225 lb (102.1 kg)  11/07/16 213 lb (96.6 kg)  07/07/16 203 lb (92.1 kg)  06/29/16 210 lb (95.3 kg)  01/13/16 203 lb (92.1 kg)  12/14/15 201 lb (91.2 kg)  10/08/15 197 lb (89.4 kg)  08/02/15 192 lb 3.2 oz (87.2 kg)  07/24/15 185 lb (83.9 kg)    Past Surgical History:  Procedure Laterality Date  . BREAST BIOPSY Bilateral 2013/2014   neg  . CERVICAL BIOPSY  W/ LOOP ELECTRODE EXCISION  2007  . CHOLECYSTECTOMY    . COLONOSCOPY WITH PROPOFOL N/A 04/03/2017   Procedure: COLONOSCOPY WITH PROPOFOL;  Surgeon: Jonathon Bellows,  MD;  Location: [facility] ENDOSCOPY;  Service: Endoscopy;  Laterality: N/A;  . ESOPHAGOGASTRODUODENOSCOPY (EGD) WITH PROPOFOL N/A 04/03/2017   Procedure: ESOPHAGOGASTRODUODENOSCOPY (EGD) WITH PROPOFOL;  Surgeon: Jonathon Bellows, MD;  Location: [facility] ENDOSCOPY;  Service: Endoscopy;  Laterality: N/A;  . GASTRIC BYPASS    . HERNIA REPAIR    . kidney stone removal    . KNEE ARTHROSCOPY    . TONSILLECTOMY         Allergies  Allergen Reactions  . Codeine Nausea Only     Current Outpatient Medications:  .  ALPRAZolam (XANAX) 0.5 MG tablet, Take 0.5 mg by mouth See admin instructions. 0.5 mg  Three times daily and 1 mg at bedtime, Disp: , Rfl: 0 .  buPROPion (WELLBUTRIN XL) 150 MG 24 hr tablet, TAKE 1 TABLET BY MOUTH EVERY MORNING, Disp: 30 tablet, Rfl: 1 .  cyclobenzaprine (FLEXERIL) 5 MG tablet, Take 1-2 tablets (5-10 mg total) by mouth 3 (three) times daily as needed for muscle spasms., Disp: 30 tablet, Rfl: 1 .  ferrous sulfate 325 (65 FE) MG tablet, Take 325 mg by mouth daily with breakfast., Disp: , Rfl:  .  lamoTRIgine (LAMICTAL) 200 MG tablet, Take 200 mg by mouth daily., Disp: , Rfl:  .  LATUDA 80 MG TABS tablet, TK 1 T PO  QPM, Disp: 30 tablet, Rfl: 1 .  levonorgestrel (MIRENA) 20 MCG/24HR IUD, by Intrauterine route., Disp: , Rfl:  .  Oxcarbazepine (TRILEPTAL) 300 MG tablet, Take 2 tablets by mouth daily., Disp: , Rfl: 0 .  propranolol (INDERAL) 20 MG tablet, Take 20 mg by mouth. , Disp: , Rfl: 1 .  Vitamin K, Phytonadione, 100 MCG TABS, Take 100 mcg by mouth daily. Take with warfarin, Disp: , Rfl:  .  warfarin (COUMADIN) 5 MG tablet, TAKE UP TO 5 TABLETS DAILY AS DIRECTED, Disp: 150 tablet, Rfl: 4  Review of Systems  Constitutional: Positive for activity change and unexpected weight change. Negative for appetite change and fatigue.  Cardiovascular: Negative for chest pain and leg swelling.  Gastrointestinal: Negative for abdominal distention, abdominal pain, anal bleeding, blood in stool,  constipation, diarrhea, nausea and rectal pain.  Genitourinary: Positive for flank pain and frequency. Negative for urgency, vaginal bleeding, vaginal discharge and vaginal pain.    Social History   Tobacco Use  . Smoking status: Never Smoker  . Smokeless tobacco: Never Used  Substance Use Topics  . Alcohol use: No    Alcohol/week: 0.0 standard drinks   Objective:   BP 104/62   Pulse 64   Temp 98.3 F (36.8 C)   Resp 16   Wt 156 lb (70.8 kg)   SpO2 98%   BMI 23.04 kg/m  Vitals:   10/04/18 1626  BP: 104/62  Pulse: 64  Resp: 16  Temp: 98.3 F (36.8 C)  SpO2: 98%  Weight: 156 lb (70.8 kg)     Physical Exam  General Appearance:    Alert, cooperative, no distress  Eyes:    PERRL, conjunctiva/corneas clear, EOM's intact       Lungs:     Clear to auscultation bilaterally, respirations unlabored  Heart:    Regular rate and rhythm  Abdomen:   Left CVAT present. Slight LUQ tenderness.    Results for orders placed or performed in visit on 10/04/18  POCT urinalysis dipstick  Result Value Ref Range   Color, UA yellow    Clarity, UA clear    Glucose, UA Negative Negative   Bilirubin, UA negative    Ketones, UA small    Spec Grav, UA 1.010 1.010 - 1.025   Blood, UA non hemolyzed trace    pH, UA 6.5 5.0 - 8.0   Protein, UA Negative Negative   Urobilinogen, UA 0.2 0.2 or 1.0 E.U./dL   Nitrite, UA negative    Leukocytes, UA Trace (A) Negative        Assessment & Plan:     1. Left flank pain  - POCT urinalysis dipstick - Urine Culture - CBC - Ferritin - CT Abdomen Pelvis W Contrast; Future  2. Weight loss, non-intentional She is up to date on routine health screening and had colonoscopy in 2018 due to anemia. She does not smoke and has no pulmonary sx. No other GI sx. No neurological sx. She did have bupropion added to her medication regiment last year and advised this could cause weight loss. She has not yet dropped to her lowest post gastric bypass weight. She  had normal met c and thyroid functions in July. She is due to check iron levels due to history of anemia.        Lelon Huh, MD  [facility]

## 2018-10-06 LAB — URINE CULTURE: ORGANISM ID, BACTERIA: NO GROWTH

## 2018-10-08 ENCOUNTER — Telehealth: Payer: Self-pay | Admitting: Family Medicine

## 2018-10-08 NOTE — Telephone Encounter (Signed)
approval #[id number] CT abd/pelvis [facility] Valid 10-22 through 11-20 2019

## 2018-10-08 NOTE — Telephone Encounter (Signed)
Insurance company would like to speak peer to peer to get auth for CT abd/pelvis.Phone [phone].Member # [id number]

## 2018-10-09 ENCOUNTER — Ambulatory Visit: Payer: BLUE CROSS/BLUE SHIELD | Admitting: Family Medicine

## 2018-10-09 ENCOUNTER — Telehealth: Payer: Self-pay

## 2018-10-09 DIAGNOSIS — Z86711 Personal history of pulmonary embolism: Secondary | ICD-10-CM

## 2018-10-09 LAB — CBC
HEMOGLOBIN: 14.2 g/dL (ref 11.1–15.9)
Hematocrit: 43.4 % (ref 34.0–46.6)
MCH: 30.3 pg (ref 26.6–33.0)
MCHC: 32.7 g/dL (ref 31.5–35.7)
MCV: 93 fL (ref 79–97)
PLATELETS: 371 10*3/uL (ref 150–450)
RBC: 4.68 x10E6/uL (ref 3.77–5.28)
RDW: 12.9 % (ref 12.3–15.4)
WBC: 7.5 10*3/uL (ref 3.4–10.8)

## 2018-10-09 LAB — POCT INR
INR: 2.2 (ref 2.0–3.0)
PT: 26.2

## 2018-10-09 LAB — FERRITIN: FERRITIN: 61 ng/mL (ref 15–150)

## 2018-10-09 NOTE — Telephone Encounter (Signed)
Patient was advised.,PC 

## 2018-10-09 NOTE — Telephone Encounter (Signed)
-----   Message from Malva Limes, MD sent at 10/09/2018  8:10 AM EDT ----- Blood count and iron levels are all good. They should be hearing from Kristen [name] to schedule the CT soon.

## 2018-10-10 ENCOUNTER — Ambulatory Visit: Payer: Self-pay | Admitting: Psychiatry

## 2018-10-11 ENCOUNTER — Ambulatory Visit
Admission: RE | Admit: 2018-10-11 | Discharge: 2018-10-11 | Disposition: A | Discharge status: Home / Self Care | Payer: BLUE CROSS/BLUE SHIELD | Source: Ambulatory Visit | Attending: Family Medicine | Admitting: Family Medicine

## 2018-10-11 DIAGNOSIS — K760 Fatty (change of) liver, not elsewhere classified: Secondary | ICD-10-CM | POA: Diagnosis not present

## 2018-10-11 DIAGNOSIS — I700 Atherosclerosis of aorta: Secondary | ICD-10-CM | POA: Diagnosis not present

## 2018-10-11 DIAGNOSIS — Z975 Presence of (intrauterine) contraceptive device: Secondary | ICD-10-CM | POA: Insufficient documentation

## 2018-10-11 DIAGNOSIS — R109 Unspecified abdominal pain: Secondary | ICD-10-CM | POA: Insufficient documentation

## 2018-10-11 DIAGNOSIS — Z9884 Bariatric surgery status: Secondary | ICD-10-CM | POA: Insufficient documentation

## 2018-10-11 DIAGNOSIS — E278 Other specified disorders of adrenal gland: Secondary | ICD-10-CM | POA: Insufficient documentation

## 2018-10-11 DIAGNOSIS — Z9049 Acquired absence of other specified parts of digestive tract: Secondary | ICD-10-CM | POA: Insufficient documentation

## 2018-10-11 MED ORDER — IOHEXOL 300 MG/ML  SOLN
100.0000 mL | Freq: Once | INTRAMUSCULAR | Status: AC | PRN
  Administered 2018-10-11: 100 mL via INTRAVENOUS

## 2018-10-15 ENCOUNTER — Telehealth: Payer: Self-pay

## 2018-10-15 NOTE — Telephone Encounter (Signed)
LVMTRC.,PC 

## 2018-10-15 NOTE — Telephone Encounter (Signed)
-----   Message from Malva Limes, MD sent at 10/14/2018  4:20 PM EDT ----- CT scan is normal. No sign of kidney stones. No abnormalities that would cause weight loss. Weight loss is likely related to her medications and expect it to level off. No sign at al of malnutrition.

## 2018-10-15 NOTE — Telephone Encounter (Signed)
Patient was advised.Patient states that she already has a urologist and will give the office a call to schedule an appointment.

## 2018-10-15 NOTE — Telephone Encounter (Signed)
I don't know. Can refer to urology for further evaluation if pain is not improving.

## 2018-10-15 NOTE — Telephone Encounter (Signed)
Please was advised. Patient would like to know why she is still having abdominal pain and pain during urination.

## 2018-10-23 ENCOUNTER — Ambulatory Visit: Payer: BLUE CROSS/BLUE SHIELD | Admitting: Family Medicine

## 2018-10-23 DIAGNOSIS — Z86711 Personal history of pulmonary embolism: Secondary | ICD-10-CM | POA: Diagnosis not present

## 2018-10-23 LAB — POCT INR
INR: 2.1 (ref 2.0–3.0)
PT: 24.9

## 2018-10-23 NOTE — Patient Instructions (Signed)
Continue 10mg  M, W, F and 15mg  the rest of the week.

## 2018-10-25 ENCOUNTER — Other Ambulatory Visit: Payer: Self-pay | Admitting: Psychiatry

## 2018-11-15 ENCOUNTER — Other Ambulatory Visit: Payer: Self-pay | Admitting: Family Medicine

## 2018-11-15 DIAGNOSIS — I2782 Chronic pulmonary embolism: Secondary | ICD-10-CM

## 2018-11-18 ENCOUNTER — Other Ambulatory Visit: Payer: Self-pay | Admitting: Psychiatry

## 2018-11-20 ENCOUNTER — Ambulatory Visit: Payer: BLUE CROSS/BLUE SHIELD | Admitting: Psychiatry

## 2018-11-20 ENCOUNTER — Encounter: Payer: Self-pay | Admitting: Psychiatry

## 2018-11-20 DIAGNOSIS — F319 Bipolar disorder, unspecified: Secondary | ICD-10-CM | POA: Diagnosis not present

## 2018-11-20 DIAGNOSIS — F4001 Agoraphobia with panic disorder: Secondary | ICD-10-CM

## 2018-11-20 DIAGNOSIS — F411 Generalized anxiety disorder: Secondary | ICD-10-CM

## 2018-11-20 DIAGNOSIS — F5105 Insomnia due to other mental disorder: Secondary | ICD-10-CM

## 2018-11-20 MED ORDER — OXCARBAZEPINE 300 MG PO TABS: 600.0000 mg | ORAL_TABLET | Freq: Every day | ORAL | 1 refills | Status: DC

## 2018-11-20 MED ORDER — BUPROPION HCL ER (XL) 150 MG PO TB24: 150.0000 mg | ORAL_TABLET | ORAL | 1 refills | Status: DC

## 2018-11-20 MED ORDER — LATUDA 80 MG PO TABS: ORAL_TABLET | ORAL | 5 refills | Status: DC

## 2018-11-20 MED ORDER — ALPRAZOLAM 0.5 MG PO TABS: 0.5000 mg | ORAL_TABLET | Freq: Three times a day (TID) | ORAL | 1 refills | Status: DC | PRN

## 2018-11-20 NOTE — Progress Notes (Signed)
Raylynn Grayce SessionsM Barge [id number] 06/13/1971 47 y.o.  Subjective:   Patient ID:  Kristen JunglingChrissy M Rumbold is a 47 y.o. (DOB 08/13/1971) female.  Chief Complaint:  Chief Complaint  Patient presents with  . Follow-up    biplar and anxiety    HPI Kristen [name] presents to the office today for follow-up of bipolar and anxiety.  She[place] dealing [place] [employer] [place].   Otherwise doing well with therapy and meds.  Pleased with current meds.  No changes wanted nor needed.  Has to take meds at the same time every day .  If late with am meds then feels real anxious and is real talkative for several hours then it resolves.  One of the am meds is JordanLatuda. No panic.  Patient reports stable mood and denies depressed or irritable moods.  Patient denies any recent difficulty with anxiety.  Patient denies difficulty with sleep initiation or maintenance. Denies appetite disturbance.  Patient reports that energy and motivation have been good.  Patient denies any difficulty with concentration.  Patient denies any suicidal ideation.  Has lost a good bit of weight since being back on Latuda and Wellbutrin.  PE including labs are normal.   Review of Systems:  Review of Systems  Neurological: Negative for tremors and weakness.  Psychiatric/Behavioral: Negative for agitation, behavioral problems, confusion, decreased concentration, dysphoric mood, hallucinations, self-injury, sleep disturbance and suicidal ideas. The patient is not nervous/anxious and is not hyperactive.     Medications: I have reviewed the patient's current medications.  Current Outpatient Medications  Medication Sig Dispense Refill  . buPROPion (WELLBUTRIN XL) 150 MG 24 hr tablet TAKE 1 TABLET BY MOUTH EVERY MORNING 30 tablet 1  . ferrous sulfate 325 (65 FE) MG tablet Take 325 mg by mouth daily with breakfast.    . lamoTRIgine (LAMICTAL) 200 MG tablet TAKE ONE TABLET BY MOUTH EVERY DAY 90 tablet 0  . LATUDA 80 MG TABS  tablet TAKE 1 TABLET BY MOUTH EVERY EVENING 30 tablet 1  . levonorgestrel (MIRENA) 20 MCG/24HR IUD by Intrauterine route.    . Oxcarbazepine (TRILEPTAL) 300 MG tablet Take 2 tablets by mouth daily.  0  . propranolol (INDERAL) 20 MG tablet TAKE 1-2 TABLETS BY MOUTH 3 TIMES DAILY AS NEEDED FOR RESTLESSNESS 180 tablet 0  . Vitamin K, Phytonadione, 100 MCG TABS Take 100 mcg by mouth daily. Take with warfarin    . warfarin (COUMADIN) 5 MG tablet TAKE UP TO 5 TABLETS DAILY AS DIRECTED 150 tablet 4  . ALPRAZolam (XANAX) 0.5 MG tablet Take 0.5 mg by mouth 3 (three) times daily as needed for anxiety or sleep. 0.5 mg  Three times daily and 1 mg at bedtime  0   No current facility-administered medications for this visit.     Medication Side Effects: None  Allergies:  Allergies  Allergen Reactions  . Codeine Nausea Only    Past Medical History:  Diagnosis Date  . Anemia   . Anxiety   . Bipolar disorder (HCC)   . Cholelithiasis   . DVT (deep venous thrombosis) (HCC)   . GERD (gastroesophageal reflux disease)   . Kidney stones   . Pulmonary embolism (HCC)   . RLS (restless legs syndrome)     Family History  Problem Relation Age of Onset  . Diabetes Father   . Stroke Father   . Heart attack Father   . Kidney failure Father   . Hypertension Father   . Uterine cancer  Maternal Grandmother   . Ulcers Maternal Grandmother   . Diabetes Maternal Grandfather   . Stroke Maternal Grandfather   . Breast cancer Paternal Grandmother   . Colon cancer Paternal Grandfather   . Hypertension Paternal Grandfather     Social History   Socioeconomic History  . Marital status: Married    Spouse name: Not on file  . Number of children: 3  . Years of education: Not on file  . Highest education level: Not on file  Occupational History  . Occupation: Software engineer: [employer]  Social Needs  . Financial resource strain: Not on file  . Food insecurity:    Worry:  Not on file    Inability: Not on file  . Transportation needs:    Medical: Not on file    Non-medical: Not on file  Tobacco Use  . Smoking status: Never Smoker  . Smokeless tobacco: Never Used  Substance and Sexual Activity  . Alcohol use: No    Alcohol/week: 0.0 standard drinks  . Drug use: No  . Sexual activity: Yes    Partners: Male    Birth control/protection: IUD    Comment: mirena  Lifestyle  . Physical activity:    Days per week: Not on file    Minutes per session: Not on file  . Stress: Not on file  Relationships  . Social connections:    Talks on phone: Not on file    Gets together: Not on file    Attends religious service: Not on file    Active member of club or organization: Not on file    Attends meetings of clubs or organizations: Not on file    Relationship status: Not on file  . Intimate partner violence:    Fear of current or ex partner: Not on file    Emotionally abused: Not on file    Physically abused: Not on file    Forced sexual activity: Not on file  Other Topics Concern  . Not on file  Social History Narrative  . Not on file    Past Medical History, Surgical history, Social history, and Family history were reviewed and updated as appropriate.   Please see review of systems for further details on the patient's review from today.   Objective:   Physical Exam:  There were no vitals taken for this visit.  Physical Exam  Constitutional: She is oriented to person, place, and time. She appears well-developed. No distress.  Musculoskeletal: She exhibits no deformity.  Neurological: She is alert and oriented to person, place, and time. She displays no tremor. Coordination and gait normal.  Psychiatric: She has a normal mood and affect. Her speech is normal and behavior is normal. Judgment and thought content normal. Her mood appears not anxious. Her affect is not angry, not blunt, not labile and not inappropriate. Cognition and memory are normal. She  does not exhibit a depressed mood. She expresses no homicidal and no suicidal ideation. She expresses no suicidal plans and no homicidal plans.  Insight intact. No auditory or visual hallucinations. No delusions.  No evidence for TD. She is attentive.    Lab Review:     Component Value Date/Time   NA 139 07/01/2018 1103   NA 140 08/15/2012 1624   K 3.8 07/01/2018 1103   K 3.6 08/15/2012 1624   CL 107 (H) 07/01/2018 1103   CL 106 08/15/2012 1624   CO2 21 07/01/2018 1103   CO2  26 08/15/2012 1624   GLUCOSE 88 07/01/2018 1103   GLUCOSE 153 (H) 03/05/2017 2142   GLUCOSE 83 08/15/2012 1624   BUN 8 07/01/2018 1103   BUN 10 08/15/2012 1624   CREATININE 0.56 (L) 07/01/2018 1103   CREATININE 0.55 (L) 08/15/2012 1624   CALCIUM 8.3 (L) 07/01/2018 1103   CALCIUM 9.0 08/15/2012 1624   PROT 5.6 (L) 07/01/2018 1103   PROT 6.5 08/15/2012 1624   ALBUMIN 3.4 (L) 07/01/2018 1103   ALBUMIN 3.4 08/15/2012 1624   AST 15 07/01/2018 1103   AST 22 08/15/2012 1624   ALT 12 07/01/2018 1103   ALT 23 08/15/2012 1624   ALKPHOS 69 07/01/2018 1103   ALKPHOS 98 08/15/2012 1624   BILITOT 0.5 07/01/2018 1103   BILITOT 0.8 08/15/2012 1624   GFRNONAA 112 07/01/2018 1103   GFRNONAA >60 08/15/2012 1624   GFRAA 129 07/01/2018 1103   GFRAA >60 08/15/2012 1624       Component Value Date/Time   WBC 7.5 10/08/2018 1603   WBC 6.9 12/21/2017 1505   RBC 4.68 10/08/2018 1603   RBC 4.76 12/21/2017 1505   HGB 14.2 10/08/2018 1603   HCT 43.4 10/08/2018 1603   PLT 371 10/08/2018 1603   MCV 93 10/08/2018 1603   MCV 91 08/15/2012 1624   MCH 30.3 10/08/2018 1603   MCH 31.1 12/21/2017 1505   MCHC 32.7 10/08/2018 1603   MCHC 33.4 12/21/2017 1505   RDW 12.9 10/08/2018 1603   RDW 15.7 (H) 08/15/2012 1624   LYMPHSABS 1.3 12/21/2017 1505   LYMPHSABS 1.5 10/08/2015 1107   LYMPHSABS 1.9 08/15/2012 1624   MONOABS 0.6 12/21/2017 1505   MONOABS 0.8 08/15/2012 1624   EOSABS 0.3 12/21/2017 1505   EOSABS 0.1 10/08/2015  1107   EOSABS 0.2 08/15/2012 1624   BASOSABS 0.1 12/21/2017 1505   BASOSABS 0.0 10/08/2015 1107   BASOSABS 0.0 08/15/2012 1624    No results found for: POCLITH, LITHIUM   No results found for: PHENYTOIN, PHENOBARB, VALPROATE, CBMZ   .res Assessment: Plan:    Bipolar I disorder (HCC)  Generalized anxiety disorder  Panic disorder with agoraphobia  Insomnia due to mental condition   Good response and stability with psych meds.  Discussed potential metabolic side effects associated with atypical antipsychotics, as well as potential risk for movement side effects. Advised pt to contact office if movement side effects occur.   We discussed the short-term risks associated with benzodiazepines including sedation and increased fall risk among others.  Discussed long-term side effect risk including dependence, potential withdrawal symptoms, and the potential eventual dose-related risk of dementia. Use LED  FU 6 mos.  Meredith Staggers, MD, DFAPA   Please see After Visit Summary for patient specific instructions.  Future Appointments  Date Time Provider Department Center  11/22/2018  4:00 PM Malva Limes, MD [facility] None    No orders of the defined types were placed in this encounter.     -------------------------------

## 2018-11-22 ENCOUNTER — Ambulatory Visit: Payer: Self-pay | Admitting: Family Medicine

## 2019-01-14 ENCOUNTER — Other Ambulatory Visit: Payer: Self-pay | Admitting: Psychiatry

## 2019-01-15 ENCOUNTER — Telehealth: Payer: Self-pay | Admitting: Psychiatry

## 2019-01-15 NOTE — Telephone Encounter (Signed)
Spoke with husband and cost of medication after prior approval, insurance and discount card is still 900.00+ for a month supply. Will get samples for pt and leave for husband to pick up.

## 2019-01-15 NOTE — Telephone Encounter (Signed)
Patient husband Lorin Picket called to request Latuda samples for Freddy. Stated Dr. Jennelle Human gives samples due to the problems with the insurance. Scott would like to be called when the samples are ready to be picked up. #[phone]

## 2019-01-24 ENCOUNTER — Other Ambulatory Visit: Payer: Self-pay | Admitting: Psychiatry

## 2019-02-19 ENCOUNTER — Ambulatory Visit: Payer: PRIVATE HEALTH INSURANCE | Admitting: Physician Assistant

## 2019-02-19 DIAGNOSIS — Z86711 Personal history of pulmonary embolism: Secondary | ICD-10-CM

## 2019-02-19 LAB — POCT INR
INR: 4 — AB (ref 2.0–3.0)
PT: 47.9

## 2019-02-19 NOTE — Patient Instructions (Signed)
Description   Dx: PE Current dose: 15mg  QD except 10mg  Monday, Wednesday and Friday PT: 47.9 INR: 4.0 Today's Changes: 10mg  QD except 15mg  Sunday, Thursday and Saturday Recheck: 1 week

## 2019-02-26 ENCOUNTER — Ambulatory Visit: Payer: PRIVATE HEALTH INSURANCE | Admitting: Family Medicine

## 2019-02-26 ENCOUNTER — Other Ambulatory Visit: Payer: Self-pay

## 2019-02-26 DIAGNOSIS — Z86711 Personal history of pulmonary embolism: Secondary | ICD-10-CM

## 2019-02-26 LAB — POCT INR: INR: 3.3 — AB (ref 2.0–3.0)

## 2019-02-26 MED ORDER — RIVAROXABAN 10 MG PO TABS: 10.0000 mg | ORAL_TABLET | Freq: Every day | ORAL | 5 refills | Status: DC

## 2019-02-26 NOTE — Patient Instructions (Addendum)
.   Continue warfarin and vitamin K through Friday March 13th. Do not take either medication starting on Saturday March 14th   You can get the prescription for Xarelto filled, but do not start it until your PT/INR is below 2.5

## 2019-02-26 NOTE — Progress Notes (Signed)
Patient: Kristen [name] Female    DOB: January 19, 1971   48 y.o.   MRN: [id number] Visit Date: 02/26/2019  Today's Provider: Mila Merry, MD   Chief Complaint  Patient presents with  . Follow-up   Subjective:     HPI  Follow up for history of Pulmonary Embolism:  The patient was last seen for this 1 weeks ago. Changes made at last visit include changing the coumadin dosage.  She reports good compliance with treatment. She feels that condition is stable. She is not having side effects. Patient would like to discuss other treatment options.   She history of RLE DVT in 2006, and bilateral PE following gastric bypass surgery 2010. Per Dr. Michaelle Copas notes she had negative hematology work up but was advised by pulmonology to stay on lifelong anticoagulation. She would like to change to NOAC due to hassle of monitoring INR, but is concerned about having another clot.   ------------------------------------------------------------------------------------  Allergies  Allergen Reactions  . Codeine Nausea Only     Current Outpatient Medications:  .  ALPRAZolam (XANAX) 0.5 MG tablet, Take 1 tablet (0.5 mg total) by mouth 3 (three) times daily as needed for anxiety or sleep. 0.5 mg  Three times daily and 1 mg at bedtime, Disp: 270 tablet, Rfl: 1 .  buPROPion (WELLBUTRIN XL) 150 MG 24 hr tablet, Take 1 tablet (150 mg total) by mouth every morning., Disp: 90 tablet, Rfl: 1 .  ferrous sulfate 325 (65 FE) MG tablet, Take 325 mg by mouth daily with breakfast., Disp: , Rfl:  .  lamoTRIgine (LAMICTAL) 200 MG tablet, TAKE ONE TABLET BY MOUTH EVERY DAY, Disp: 90 tablet, Rfl: 0 .  LATUDA 80 MG TABS tablet, TAKE 1 TABLET BY MOUTH EVERY EVENING, Disp: 30 tablet, Rfl: 5 .  levonorgestrel (MIRENA) 20 MCG/24HR IUD, by Intrauterine route., Disp: , Rfl:  .  Oxcarbazepine (TRILEPTAL) 300 MG tablet, Take 2 tablets (600 mg total) by mouth daily., Disp: 180 tablet, Rfl: 1 .  propranolol (INDERAL) 20 MG  tablet, TAKE 1 TO 2 TABLETS BY MOUTH 3 TIMES DAILY AS NEEDED FOR RESTLESSNESS, Disp: 180 tablet, Rfl: 4 .  Vitamin K, Phytonadione, 100 MCG TABS, Take 100 mcg by mouth daily. Take with warfarin, Disp: , Rfl:  .  warfarin (COUMADIN) 5 MG tablet, TAKE UP TO 5 TABLETS DAILY AS DIRECTED, Disp: 150 tablet, Rfl: 4  Review of Systems  Constitutional: Negative for appetite change, chills, fatigue and fever.  Respiratory: Negative for chest tightness and shortness of breath.   Cardiovascular: Negative for chest pain and palpitations.  Gastrointestinal: Negative for abdominal pain, nausea and vomiting.  Neurological: Negative for dizziness and weakness.    Social History   Tobacco Use  . Smoking status: Never Smoker  . Smokeless tobacco: Never Used  Substance Use Topics  . Alcohol use: No    Alcohol/week: 0.0 standard drinks      Objective:   BP 94/62 (BP Location: Left Arm, Patient Position: Sitting, Cuff Size: Normal)   Pulse 71   Temp 99 F (37.2 C) (Oral)   Resp 16   Wt 152 lb (68.9 kg)   SpO2 97% Comment: room air  BMI 22.45 kg/m  Vitals:   02/26/19 1630  BP: 94/62  Pulse: 71  Resp: 16  Temp: 99 F (37.2 C)  TempSrc: Oral  SpO2: 97%  Weight: 152 lb (68.9 kg)     Physical Exam  General appearance: alert, well developed, well nourished,  cooperative and in no distress Head: Normocephalic, without obvious abnormality, atraumatic Respiratory: Respirations even and unlabored, normal respiratory rate Extremities: No gross deformities Skin: Skin color, texture, turgor normal. No rashes seen  Psych: Appropriate mood and affect. Neurologic: Mental status: Alert, oriented to person, place, and time, thought content appropriate.  INR=3.3    Office Visit from 02/26/2019 in [facility] Total Score  0         Assessment & Plan    1. Personal history of PE (pulmonary embolism) Discussed risk/benefit potential adverse effects warfarin versus NOAC. She  would like to change. Given prescription Xarelto 10mg  daily, but not to start until her INR is down. She can't come back to check INR until next week, so will continue warfarin and vitamin K the next 3 days.  - POCT INR     Mila Merry, MD  [facility]

## 2019-02-27 ENCOUNTER — Ambulatory Visit: Payer: BLUE CROSS/BLUE SHIELD | Admitting: Psychiatry

## 2019-03-05 ENCOUNTER — Other Ambulatory Visit: Payer: Self-pay

## 2019-03-05 ENCOUNTER — Ambulatory Visit: Payer: PRIVATE HEALTH INSURANCE

## 2019-03-05 DIAGNOSIS — Z86711 Personal history of pulmonary embolism: Secondary | ICD-10-CM

## 2019-03-05 LAB — POCT INR
INR: 1 — AB (ref 2.0–3.0)
PT: 12.4

## 2019-03-05 NOTE — Patient Instructions (Signed)
Description   Dx: PE Current Coumadin dose: none PT: 12.4 INR: 1.0 Today's Changes: start Xarelto 10mg  daily Follow up in 1 month with Dr. Sherrie Mustache for office visit.

## 2019-03-10 ENCOUNTER — Ambulatory Visit: Payer: Self-pay | Admitting: Psychiatry

## 2019-03-10 ENCOUNTER — Ambulatory Visit: Payer: PRIVATE HEALTH INSURANCE | Admitting: Psychiatry

## 2019-03-10 ENCOUNTER — Encounter: Payer: Self-pay | Admitting: Psychiatry

## 2019-03-10 ENCOUNTER — Other Ambulatory Visit: Payer: Self-pay

## 2019-03-10 DIAGNOSIS — F4001 Agoraphobia with panic disorder: Secondary | ICD-10-CM

## 2019-03-10 DIAGNOSIS — F319 Bipolar disorder, unspecified: Secondary | ICD-10-CM

## 2019-03-10 DIAGNOSIS — F411 Generalized anxiety disorder: Secondary | ICD-10-CM

## 2019-03-10 DIAGNOSIS — F5105 Insomnia due to other mental disorder: Secondary | ICD-10-CM

## 2019-03-10 NOTE — Progress Notes (Signed)
Kristen Kristen [name] [id number] [birth year] 48 y.o.  Subjective:   Patient ID:  Kristen Kristen [name] is a 48 y.o. (DOB 1971-10-16) female.  Chief Complaint:  Chief Complaint  Patient presents with  . Medication Problem    cost    HPI Kristen Kristen [name] presents to the office today for follow-up of bipolar and anxiety.    She was last seen November 20, 2018.  At that visit no meds were changed.  Can't afford Latuda $800+ monthly and yet it has worked well.  She's failed multiple other meds.  She's dealing with insurance issues with [employer] in [place].   Otherwise doing well with therapy and meds.  Pleased with current meds.  No changes wanted nor needed.  Has to take meds at the same time every day .  If late with am meds then feels real anxious and is real talkative for several hours then it resolves.  One of the am meds is Kristen [name]. No panic  Worries about depresssion and anxiety worsening off Latuda bc it did before..  Patient reports stable mood and denies depressed or irritable moods.  Patient denies any recent difficulty with anxiety.  Patient denies difficulty with sleep initiation or maintenance. Denies appetite disturbance.  Patient reports that energy and motivation have been good.  Patient denies any difficulty with concentration.  Patient denies any suicidal ideation.  Has lost a good bit of weight since being back on Latuda and Wellbutrin.  PE including labs are normal.  Past Psychiatric Medication Trials: Vraylar 3 restless, Latuda 80, oxcarbazine 900 SE,   risperidone nonresponse, lamotrigine 300, pramipexole, Seroquel weight gain, Abilify 10, lithium, duloxetine, Lexapro, Wellbutrin, fluoxetine, venlafaxine, sertraline, oxcarbazepine, clonazepam, alprazolam, buspar NR  Review of Systems:  Review of Systems  Neurological: Negative for tremors and weakness.  Psychiatric/Behavioral: Negative for agitation, behavioral problems, confusion, decreased concentration, dysphoric mood,  hallucinations, self-injury, sleep disturbance and suicidal ideas. The patient is not nervous/anxious and is not hyperactive.     Medications: I have reviewed the patient's current medications.  Current Outpatient Medications  Medication Sig Dispense Refill  . ALPRAZolam (XANAX) 0.5 MG tablet Take 1 tablet (0.5 mg total) by mouth 3 (three) times daily as needed for anxiety or sleep. 0.5 mg  Three times daily and 1 mg at bedtime 270 tablet 1  . buPROPion (WELLBUTRIN XL) 150 MG 24 hr tablet Take 1 tablet (150 mg total) by mouth every morning. 90 tablet 1  . lamoTRIgine (LAMICTAL) 200 MG tablet TAKE ONE TABLET BY MOUTH EVERY DAY 90 tablet 0  . LATUDA 80 MG TABS tablet TAKE 1 TABLET BY MOUTH EVERY EVENING 30 tablet 5  . levonorgestrel (MIRENA) 20 MCG/24HR IUD by Intrauterine route.    . Oxcarbazepine (TRILEPTAL) 300 MG tablet Take 2 tablets (600 mg total) by mouth daily. 180 tablet 1  . propranolol (INDERAL) 20 MG tablet TAKE 1 TO 2 TABLETS BY MOUTH 3 TIMES DAILY AS NEEDED FOR RESTLESSNESS 180 tablet 4  . rivaroxaban (XARELTO) 10 MG TABS tablet Take 1 tablet (10 mg total) by mouth daily. 30 tablet 5  . ferrous sulfate 325 (65 FE) MG tablet Take 325 mg by mouth daily with breakfast.    . Vitamin K, Phytonadione, 100 MCG TABS Take 100 mcg by mouth daily. Take with warfarin     No current facility-administered medications for this visit.     Medication Side Effects: None  Allergies:  Allergies  Allergen Reactions  . Codeine Nausea Only  Past Medical History:  Diagnosis Date  . Anemia   . Anxiety   . Bipolar disorder (HCC)   . Cholelithiasis   . DVT (deep venous thrombosis) (HCC)   . GERD (gastroesophageal reflux disease)   . Kidney stones   . Pulmonary embolism (HCC)   . RLS (restless legs syndrome)     Family History  Problem Relation Age of Onset  . Diabetes Father   . Stroke Father   . Heart attack Father   . Kidney failure Father   . Hypertension Father   . Uterine  cancer Maternal Grandmother   . Ulcers Maternal Grandmother   . Diabetes Maternal Grandfather   . Stroke Maternal Grandfather   . Breast cancer Paternal Grandmother   . Colon cancer Paternal Grandfather   . Hypertension Paternal Grandfather     Social History   Socioeconomic History  . Marital status: Married    Spouse name: Not on file  . Number of children: 3  . Years of education: Not on file  . Highest education level: Not on file  Occupational History  . Occupation: Software engineer: [employer]  Social Needs  . Financial resource strain: Not on file  . Food insecurity:    Worry: Not on file    Inability: Not on file  . Transportation needs:    Medical: Not on file    Non-medical: Not on file  Tobacco Use  . Smoking status: Never Smoker  . Smokeless tobacco: Never Used  Substance and Sexual Activity  . Alcohol use: No    Alcohol/week: 0.0 standard drinks  . Drug use: No  . Sexual activity: Yes    Partners: Male    Birth control/protection: I.U.D.    Comment: mirena  Lifestyle  . Physical activity:    Days per week: Not on file    Minutes per session: Not on file  . Stress: Not on file  Relationships  . Social connections:    Talks on phone: Not on file    Gets together: Not on file    Attends religious service: Not on file    Active member of club or organization: Not on file    Attends meetings of clubs or organizations: Not on file    Relationship status: Not on file  . Intimate partner violence:    Fear of current or ex partner: Not on file    Emotionally abused: Not on file    Physically abused: Not on file    Forced sexual activity: Not on file  Other Topics Concern  . Not on file  Social History Narrative  . Not on file    Past Medical History, Surgical history, Social history, and Family history were reviewed and updated as appropriate.   Please see review of systems for further details on the patient's review  from today.   Objective:   Physical Exam:  There were no vitals taken for this visit.  Physical Exam Constitutional:      General: She is not in acute distress.    Appearance: She is well-developed.  Musculoskeletal:        General: No deformity.  Neurological:     Mental Status: She is alert and oriented to person, place, and time.     Motor: No tremor.     Coordination: Coordination normal.     Gait: Gait normal.  Psychiatric:        Attention and Perception: She is attentive.  Mood and Affect: Mood is not anxious or depressed. Affect is not labile, blunt, angry or inappropriate.        Speech: Speech normal.        Behavior: Behavior normal.        Thought Content: Thought content normal. Thought content does not include homicidal or suicidal ideation. Thought content does not include homicidal or suicidal plan.        Judgment: Judgment normal.     Comments: Insight intact. No auditory or visual hallucinations. No delusions.  No evidence for TD.     Lab Review:     Component Value Date/Time   NA 139 07/01/2018 1103   NA 140 08/15/2012 1624   K 3.8 07/01/2018 1103   K 3.6 08/15/2012 1624   CL 107 (H) 07/01/2018 1103   CL 106 08/15/2012 1624   CO2 21 07/01/2018 1103   CO2 26 08/15/2012 1624   GLUCOSE 88 07/01/2018 1103   GLUCOSE 153 (H) 03/05/2017 2142   GLUCOSE 83 08/15/2012 1624   BUN 8 07/01/2018 1103   BUN 10 08/15/2012 1624   CREATININE 0.56 (L) 07/01/2018 1103   CREATININE 0.55 (L) 08/15/2012 1624   CALCIUM 8.3 (L) 07/01/2018 1103   CALCIUM 9.0 08/15/2012 1624   PROT 5.6 (L) 07/01/2018 1103   PROT 6.5 08/15/2012 1624   ALBUMIN 3.4 (L) 07/01/2018 1103   ALBUMIN 3.4 08/15/2012 1624   AST 15 07/01/2018 1103   AST 22 08/15/2012 1624   ALT 12 07/01/2018 1103   ALT 23 08/15/2012 1624   ALKPHOS 69 07/01/2018 1103   ALKPHOS 98 08/15/2012 1624   BILITOT 0.5 07/01/2018 1103   BILITOT 0.8 08/15/2012 1624   GFRNONAA 112 07/01/2018 1103   GFRNONAA >60  08/15/2012 1624   GFRAA 129 07/01/2018 1103   GFRAA >60 08/15/2012 1624       Component Value Date/Time   WBC 7.5 10/08/2018 1603   WBC 6.9 12/21/2017 1505   RBC 4.68 10/08/2018 1603   RBC 4.76 12/21/2017 1505   HGB 14.2 10/08/2018 1603   HCT 43.4 10/08/2018 1603   PLT 371 10/08/2018 1603   MCV 93 10/08/2018 1603   MCV 91 08/15/2012 1624   MCH 30.3 10/08/2018 1603   MCH 31.1 12/21/2017 1505   MCHC 32.7 10/08/2018 1603   MCHC 33.4 12/21/2017 1505   RDW 12.9 10/08/2018 1603   RDW 15.7 (H) 08/15/2012 1624   LYMPHSABS 1.3 12/21/2017 1505   LYMPHSABS 1.5 10/08/2015 1107   LYMPHSABS 1.9 08/15/2012 1624   MONOABS 0.6 12/21/2017 1505   MONOABS 0.8 08/15/2012 1624   EOSABS 0.3 12/21/2017 1505   EOSABS 0.1 10/08/2015 1107   EOSABS 0.2 08/15/2012 1624   BASOSABS 0.1 12/21/2017 1505   BASOSABS 0.0 10/08/2015 1107   BASOSABS 0.0 08/15/2012 1624    No results found for: POCLITH, LITHIUM   No results found for: PHENYTOIN, PHENOBARB, VALPROATE, CBMZ   .res Assessment: Plan:    Bipolar I disorder (HCC)  Generalized anxiety disorder  Panic disorder with agoraphobia  Insomnia due to mental condition   Greater than 50% of face to face time with patient was spent on counseling and coordination of care. We discussed Good response and stability with psych meds.  However can't stay on Latuda which is working.  This is very unfortunate.  Disc alternative of return to Vraylar but it's likely to be expensive also.  But we are able to supply samples and she may be able to take it  for a few months at minimal cost.  She is aware that this is also a branded product but there are discount card tends to be more helpful than as the card with Latuda.  As noted she is failed multiple other medications.  We discussed the possibility of increasing oxcarbazepine in place of adding Vraylar if her primary symptoms are agitation and anxiety as opposed to depression for which Leafy Kindle is more likely to be  helpful.  Discussed potential metabolic side effects associated with atypical antipsychotics, as well as potential risk for movement side effects. Advised pt to contact office if movement side effects occur.   Reduce Latuda to next he milligrams daily for 1 week, Then reduce to 40 mg daily for 1 week,  then reduce to 20 mg daily for 2 weeks and stop  Start Vraylar 1.5 mg daily or 3 mg every other day if and when you start noticing more depression or other mood symptoms.  If the anxiety gets worse then increase the oxcarbazepine to one half in the morning and 2 at night.  Then perhaps he can go even higher if she tolerates it and it is necessary.    Discussed side effects of each med in detail.  We discussed the short-term risks associated with benzodiazepines including sedation and increased fall risk among others.  Discussed long-term side effect risk including dependence, potential withdrawal symptoms, and the potential eventual dose-related risk of dementia. Use LED  This was a 30-minute appointment  FU 8 weeks  Meredith Staggers, MD, DFAPA   Please see After Visit Summary for patient specific instructions.  Future Appointments  Date Time Provider Department Center  04/08/2019  4:20 PM Malva Limes, MD [facility] None  05/22/2019  9:00 AM Cottle, Steva Ready., MD [facility] None    No orders of the defined types were placed in this encounter.     -------------------------------

## 2019-03-10 NOTE — Patient Instructions (Addendum)
Reduce Latuda to next he milligrams daily for 1 week, Then reduce to 40 mg daily for 1 week,  then reduce to 20 mg daily for 2 weeks and stop  Start Vraylar1.5 mg daily or 3 mg every other day if and when you start noticing more depression or other mood symptoms.  If the anxiety gets worse then increase the oxcarbazepine to one half in the morning and 2 at night

## 2019-03-25 ENCOUNTER — Other Ambulatory Visit: Payer: Self-pay

## 2019-03-25 ENCOUNTER — Ambulatory Visit (INDEPENDENT_AMBULATORY_CARE_PROVIDER_SITE_OTHER): Payer: PRIVATE HEALTH INSURANCE | Admitting: Family Medicine

## 2019-03-25 ENCOUNTER — Encounter: Payer: Self-pay | Admitting: Family Medicine

## 2019-03-25 VITALS — BP 111/71 | HR 63

## 2019-03-25 DIAGNOSIS — G44209 Tension-type headache, unspecified, not intractable: Secondary | ICD-10-CM | POA: Diagnosis not present

## 2019-03-25 MED ORDER — TIZANIDINE HCL 4 MG PO CAPS: 4.0000 mg | ORAL_CAPSULE | Freq: Every day | ORAL | 1 refills | Status: DC

## 2019-03-25 NOTE — Patient Instructions (Signed)
.   Please review the attached list of medications and notify my office if there are any errors.   . Please bring all of your medications to every appointment so we can make sure that our medication list is the same as yours.   

## 2019-03-25 NOTE — Progress Notes (Signed)
Patient: Kristen [name] Female    DOB: 06/01/1971   48 y.o.   MRN: [id number] Visit Date: 03/25/2019  Today's Provider: Mila Merryonald Fisher, MD   Chief Complaint  Patient presents with  . Headache   Subjective:    [facility]  Virtual Visit via Video Note  I connected with Kristen [name] on 03/25/19 at  8:20 AM EDT by a video enabled telemedicine application and verified that I am speaking with the correct person using two identifiers.   I discussed the limitations of evaluation and management by telemedicine and the availability of in person appointments. The patient expressed understanding and agreed to proceed.   Headache   Pertinent negatives include no abdominal pain, dizziness, fever, nausea, numbness, seizures, vomiting or weakness.  She reports she has been having tension type headaches off and on for the last year, but has been much more persistent the last few months. Is now having headaches every day. Sometimes wakes up with headache and gets progressively worse throughout the day. Starts like feeling knots in the back of her neck, then spreads across the top of her head. Can occur on either side, but more frequently on the left. Takes Advil occasionally which helps, but doesn't want to take every day due to potential for rebound phenomena.  Denies any fevers. Denies any other neurological sx such as numbness, tingling, weakness of extremities, blurry vision, or speech difficulties. She also reports that her psychiatrist is weaning her off of latuda   Allergies  Allergen Reactions  . Codeine Nausea Only     Current Outpatient Medications:  .  ALPRAZolam (XANAX) 0.5 MG tablet, Take 1 tablet (0.5 mg total) by mouth 3 (three) times daily as needed for anxiety or sleep. 0.5 mg  Three times daily and 1 mg at bedtime, Disp: 270 tablet, Rfl: 1 .  buPROPion (WELLBUTRIN XL) 150 MG 24 hr tablet, Take 1 tablet (150 mg total) by mouth every morning., Disp: 90 tablet, Rfl: 1 .   lamoTRIgine (LAMICTAL) 200 MG tablet, TAKE ONE TABLET BY MOUTH EVERY DAY, Disp: 90 tablet, Rfl: 0 .  LATUDA 80 MG TABS tablet, TAKE 1 TABLET BY MOUTH EVERY EVENING, Disp: 30 tablet, Rfl: 5  (BEING WEANED OFF OF MEDICATION BY HER PSYCHIATRIST) .  levonorgestrel (MIRENA) 20 MCG/24HR IUD, by Intrauterine route., Disp: , Rfl:  .  Oxcarbazepine (TRILEPTAL) 300 MG tablet, Take 2 tablets (600 mg total) by mouth daily., Disp: 180 tablet, Rfl: 1 .  propranolol (INDERAL) 20 MG tablet, TAKE 1 TO 2 TABLETS BY MOUTH 3 TIMES DAILY AS NEEDED FOR RESTLESSNESS, Disp: 180 tablet, Rfl: 4 .  rivaroxaban (XARELTO) 10 MG TABS tablet, Take 1 tablet (10 mg total) by mouth daily., Disp: 30 tablet, Rfl: 5 .  ferrous sulfate 325 (65 FE) MG tablet, Take 325 mg by mouth daily with breakfast., Disp: , Rfl:  .  Vitamin K, Phytonadione, 100 MCG TABS, Take 100 mcg by mouth daily. Take with warfarin, Disp: , Rfl:   Review of Systems  Constitutional: Negative for appetite change, chills, fatigue and fever.  Respiratory: Negative for chest tightness and shortness of breath.   Cardiovascular: Negative for chest pain and palpitations.  Gastrointestinal: Negative for abdominal pain, nausea and vomiting.  Neurological: Positive for headaches. Negative for dizziness, tremors, seizures, facial asymmetry, speech difficulty, weakness, light-headedness and numbness.    Social History   Tobacco Use  . Smoking status: Never Smoker  . Smokeless tobacco: Never Used  Substance Use Topics  . Alcohol use: No    Alcohol/week: 0.0 standard drinks      Objective:   BP 111/71   Pulse 63  (patient reported)   Physical Exam  General appearance: alert, well developed, well nourished, cooperative and in no distress Head: Normocephalic, without obvious abnormality, atraumatic Psych: Appropriate mood and affect.     Assessment & Plan    1. Tension headache Is having headaches everyday, no focal neurological symptoms. Discussed keeping  food diary try- tiZANidine (ZANAFLEX) 4 MG capsule; Take 1 capsule (4 mg total) by mouth at bedtime.  Dispense: 30 capsule; Refill: 1  Doubt she would tolerate a betablocker considering her baseline heart rate and blood pressure. May be candidate for TCA if tizanidine not effective. Will call next week and see how tizanidine is working.     \  I discussed the assessment and treatment plan with the patient. The patient was provided an opportunity to ask questions and all were answered. The patient agreed with the plan and demonstrated an understanding of the instructions.   The patient was advised to call back or seek an in-person evaluation if the symptoms worsen or if the condition fails to improve as anticipated.  I provided 12 minutes of non-face-to-face time during this encounter.  Mila Merry, MD  [facility]

## 2019-04-01 ENCOUNTER — Telehealth: Payer: Self-pay

## 2019-04-01 MED ORDER — GABAPENTIN 100 MG PO CAPS: ORAL_CAPSULE | ORAL | 1 refills | Status: DC

## 2019-04-01 NOTE — Telephone Encounter (Signed)
Try changing to gabapentin in its place. [facility]

## 2019-04-01 NOTE — Telephone Encounter (Signed)
Patient was advised.  

## 2019-04-01 NOTE — Telephone Encounter (Signed)
Patient stated that the Tizanidine 4 mg is not helping with her headaches and she was advised to call Dr.Fisher back if not feeling or getting any better. Please advise.

## 2019-04-01 NOTE — Telephone Encounter (Signed)
LMOVM for pt to return call 

## 2019-04-03 ENCOUNTER — Other Ambulatory Visit: Payer: Self-pay | Admitting: Psychiatry

## 2019-04-08 ENCOUNTER — Encounter: Payer: Self-pay | Admitting: Family Medicine

## 2019-04-08 ENCOUNTER — Other Ambulatory Visit: Payer: Self-pay

## 2019-04-08 ENCOUNTER — Ambulatory Visit: Payer: Self-pay | Admitting: Family Medicine

## 2019-04-08 ENCOUNTER — Ambulatory Visit (INDEPENDENT_AMBULATORY_CARE_PROVIDER_SITE_OTHER): Payer: PRIVATE HEALTH INSURANCE | Admitting: Family Medicine

## 2019-04-08 ENCOUNTER — Telehealth: Payer: Self-pay

## 2019-04-08 DIAGNOSIS — R51 Headache: Secondary | ICD-10-CM

## 2019-04-08 DIAGNOSIS — R519 Headache, unspecified: Secondary | ICD-10-CM

## 2019-04-08 NOTE — Patient Instructions (Signed)
.   Please review the attached list of medications and notify my office if there are any errors.   . Please bring all of your medications to every appointment so we can make sure that our medication list is the same as yours.   

## 2019-04-08 NOTE — Progress Notes (Signed)
       Patient: Kristen [name] Female    DOB: Apr 24, 1971   48 y.o.   MRN: [id number] Visit Date: 04/08/2019  Today's Provider: Mila Merry, MD   Chief Complaint  Patient presents with  . Follow-up   Subjective:     HPI  Follow up for Headaches:  The patient was last seen for this 2 weeks ago. Changes made at last visit include starting Tizanidine.She called in the next week with no improvement and was changed to gabapentin, however. Patient never started taking Gabapentin. She states she started taking OTC tylenol cold and sinus in addition to tizanidine and headaches greatly improved, and she did not want to start any medications that might affect her mood.  She feels that condition is Improved. She has been taking sinus and allergy medication. She is not having side effects. Headaches have no resolved.   ------------------------------------------------------------------------------------  Allergies  Allergen Reactions  . Codeine Nausea Only     Current Outpatient Medications:  .  ALPRAZolam (XANAX) 0.5 MG tablet, Take 1 tablet (0.5 mg total) by mouth 3 (three) times daily as needed for anxiety or sleep. 0.5 mg  Three times daily and 1 mg at bedtime, Disp: 270 tablet, Rfl: 1 .  buPROPion (WELLBUTRIN XL) 150 MG 24 hr tablet, Take 1 tablet (150 mg total) by mouth every morning., Disp: 90 tablet, Rfl: 1 .  lamoTRIgine (LAMICTAL) 200 MG tablet, TAKE 1 TABLET BY MOUTH DAILY, Disp: 90 tablet, Rfl: 0 .  LATUDA 80 MG TABS tablet, TAKE 1 TABLET BY MOUTH EVERY EVENING, Disp: 30 tablet, Rfl: 5 .  levonorgestrel (MIRENA) 20 MCG/24HR IUD, by Intrauterine route., Disp: , Rfl:  .  Oxcarbazepine (TRILEPTAL) 300 MG tablet, TAKE TWO TABLETS BY MOUTH EVERY DAY, Disp: 180 tablet, Rfl: 0 .  propranolol (INDERAL) 20 MG tablet, TAKE 1 TO 2 TABLETS BY MOUTH 3 TIMES DAILY AS NEEDED FOR RESTLESSNESS, Disp: 180 tablet, Rfl: 4 .  rivaroxaban (XARELTO) 10 MG TABS tablet, Take 1 tablet (10 mg total) by  mouth daily., Disp: 30 tablet, Rfl: 5 .  ferrous sulfate 325 (65 FE) MG tablet, Take 325 mg by mouth daily with breakfast., Disp: , Rfl:  .  gabapentin (NEURONTIN) 100 MG capsule, Start one tablet at bedtime for 3 days, then increase to 2 at bedtime, then increase to 3 at bedtime if needed to prevent headache (Patient not taking: Reported on 04/08/2019), Disp: 60 capsule, Rfl: 1  Review of Systems  Social History   Tobacco Use  . Smoking status: Never Smoker  . Smokeless tobacco: Never Used  Substance Use Topics  . Alcohol use: No    Alcohol/week: 0.0 standard drinks      Objective:    There were no vitals filed for this visit.   Physical Exam      Assessment & Plan    1. Persistent headaches Now resolved. Likely combination tension and sinus headaches. Never started gabapentin. No longer requiring daily tizanidine and allergy meds. Advised to call if sx flare back up.      Mila Merry, MD  [facility]

## 2019-04-08 NOTE — Telephone Encounter (Signed)
Patient called office to let CMA know her vitals. B/P was 115/76 and HR 65. KW

## 2019-05-06 ENCOUNTER — Encounter: Payer: Self-pay | Admitting: Psychiatry

## 2019-05-06 ENCOUNTER — Ambulatory Visit (INDEPENDENT_AMBULATORY_CARE_PROVIDER_SITE_OTHER): Payer: PRIVATE HEALTH INSURANCE | Admitting: Psychiatry

## 2019-05-06 ENCOUNTER — Other Ambulatory Visit: Payer: Self-pay

## 2019-05-06 DIAGNOSIS — F319 Bipolar disorder, unspecified: Secondary | ICD-10-CM

## 2019-05-06 DIAGNOSIS — F422 Mixed obsessional thoughts and acts: Secondary | ICD-10-CM | POA: Diagnosis not present

## 2019-05-06 DIAGNOSIS — F5105 Insomnia due to other mental disorder: Secondary | ICD-10-CM

## 2019-05-06 DIAGNOSIS — F411 Generalized anxiety disorder: Secondary | ICD-10-CM | POA: Diagnosis not present

## 2019-05-06 DIAGNOSIS — F4001 Agoraphobia with panic disorder: Secondary | ICD-10-CM

## 2019-05-06 DIAGNOSIS — F515 Nightmare disorder: Secondary | ICD-10-CM

## 2019-05-06 MED ORDER — OXCARBAZEPINE 300 MG PO TABS: 900.0000 mg | ORAL_TABLET | Freq: Every day | ORAL | 0 refills | Status: DC

## 2019-05-06 NOTE — Progress Notes (Signed)
Kristen [name] [id number] November 29, 1971 48 y.o.  Virtual Visit via Telephone Note  I connected with pt by telephone and verified that I am speaking with the correct person using two identifiers.   I discussed the limitations, risks, security and privacy concerns of performing an evaluation and management service by telephone and the availability of in person appointments. I also discussed with the patient that there may be a patient responsible charge related to this service. The patient expressed understanding and agreed to proceed.  I discussed the assessment and treatment plan with the patient. The patient was provided an opportunity to ask questions and all were answered. The patient agreed with the plan and demonstrated an understanding of the instructions.   The patient was advised to call back or seek an in-person evaluation if the symptoms worsen or if the condition fails to improve as anticipated.  I provided 30 minutes of non-face-to-face time during this encounter. The call started at 3:00 and ended at 330. The patient was located at home and the provider was located office.  Subjective:   Patient ID:  Kristen [name] is a 48 y.o. (DOB January 15, 1971) female.  Chief Complaint:  Chief Complaint  Patient presents with  . Follow-up    Medication Management  . Anxiety    Medication Management  . Medication Problem    I did change medicines due to cost    Depression         Associated symptoms include no decreased concentration and no suicidal ideas.  Past medical history includes anxiety.   Anxiety  Patient reports no confusion, decreased concentration, nervous/anxious behavior or suicidal ideas.     Kristen [name] presents  today for follow-up of bipolar and anxiety.    Last visit was March 10 2019.  She could not t afford Latuda $800+ monthly and yet it has worked well.  She's failed multiple other meds.  We had to wean her off of Latuda knowing that could lead to relapse.   Plan was to start Vraylar at a low dose of 1.5 mg every other day.  Also the option of increasing oxcarbazepine was discussed if she had recurrence of symptoms.  Off Latuda for 4 weeks.    She's not taking the Vraylar trying to avoid it bc of the SE the last time she took it. Didn't help that much with depression and restless with 3 mg.    She increased the Trileptal.  She has increase in depression and anxiety. Return of NM for a couple of weeks now.  Nm about death also and painic when awakens. Increase in obsessing.  If I don't do things a certain way then something bad will happen.  Example have to drive the same way daily or else someone will die.  Worsened over the last 4 weeks without reason..  Adequate duration of sleep.  Anxiety is worse than depression now.  No mania generally.    As a teen had obsessive thought and checking went away.  Had lost a good bit of weight since being back on Latuda and Wellbutrin.  PE including labs are normal.   Past Psychiatric Medication Trials: Vraylar 3 restless, Latuda 80, oxcarbazine 900 SE,   risperidone nonresponse, lamotrigine 300, pramipexole, Seroquel weight gain, Abilify 10, lithium, duloxetine, Lexapro, Wellbutrin, fluoxetine, venlafaxine, sertraline, oxcarbazepine, clonazepam, alprazolam, buspar NR  Review of Systems:  No tremor, no weakness.  Medications: I have reviewed the patient's current medications.  Current Outpatient Medications  Medication Sig  Dispense Refill  . ALPRAZolam (XANAX) 0.5 MG tablet Take 1 tablet (0.5 mg total) by mouth 3 (three) times daily as needed for anxiety or sleep. 0.5 mg  Three times daily and 1 mg at bedtime (Patient taking differently: Take 0.5 mg by mouth. 0.5 mg  Three times daily and 1 mg at bedtime) 270 tablet 1  . buPROPion (WELLBUTRIN XL) 150 MG 24 hr tablet Take 1 tablet (150 mg total) by mouth every morning. 90 tablet 1  . lamoTRIgine (LAMICTAL) 200 MG tablet TAKE 1 TABLET BY MOUTH DAILY 90 tablet 0   . levonorgestrel (MIRENA) 20 MCG/24HR IUD by Intrauterine route.    . Oxcarbazepine (TRILEPTAL) 300 MG tablet TAKE TWO TABLETS BY MOUTH EVERY DAY (Patient taking differently: Take 750 mg by mouth at bedtime. ) 180 tablet 0  . propranolol (INDERAL) 20 MG tablet TAKE 1 TO 2 TABLETS BY MOUTH 3 TIMES DAILY AS NEEDED FOR RESTLESSNESS 180 tablet 4  . rivaroxaban (XARELTO) 10 MG TABS tablet Take 1 tablet (10 mg total) by mouth daily. 30 tablet 5   No current facility-administered medications for this visit.     Medication Side Effects: None  Allergies:  Allergies  Allergen Reactions  . Codeine Nausea Only    Past Medical History:  Diagnosis Date  . Anemia   . Anxiety   . Bipolar disorder (HCC)   . Cholelithiasis   . DVT (deep venous thrombosis) (HCC)   . GERD (gastroesophageal reflux disease)   . Kidney stones   . Pulmonary embolism (HCC)   . RLS (restless legs syndrome)     Family History  Problem Relation Age of Onset  . Diabetes Father   . Stroke Father   . Heart attack Father   . Kidney failure Father   . Hypertension Father   . Uterine cancer Maternal Grandmother   . Ulcers Maternal Grandmother   . Diabetes Maternal Grandfather   . Stroke Maternal Grandfather   . Breast cancer Paternal Grandmother   . Colon cancer Paternal Grandfather   . Hypertension Paternal Grandfather     Social History   Socioeconomic History  . Marital status: Married    Spouse name: Not on file  . Number of children: 3  . Years of education: Not on file  . Highest education level: Not on file  Occupational History  . Occupation: Software engineer: [employer]  Social Needs  . Financial resource strain: Not on file  . Food insecurity:    Worry: Not on file    Inability: Not on file  . Transportation needs:    Medical: Not on file    Non-medical: Not on file  Tobacco Use  . Smoking status: Never Smoker  . Smokeless tobacco: Never Used  Substance and  Sexual Activity  . Alcohol use: No    Alcohol/week: 0.0 standard drinks  . Drug use: No  . Sexual activity: Yes    Partners: Male    Birth control/protection: I.U.D.    Comment: mirena  Lifestyle  . Physical activity:    Days per week: Not on file    Minutes per session: Not on file  . Stress: Not on file  Relationships  . Social connections:    Talks on phone: Not on file    Gets together: Not on file    Attends religious service: Not on file    Active member of club or organization: Not on file    Attends meetings  of clubs or organizations: Not on file    Relationship status: Not on file  . Intimate partner violence:    Fear of current or ex partner: Not on file    Emotionally abused: Not on file    Physically abused: Not on file    Forced sexual activity: Not on file  Other Topics Concern  . Not on file  Social History Narrative  . Not on file    Past Medical History, Surgical history, Social history, and Family history were reviewed and updated as appropriate.   Please see review of systems for further details on the patient's review from today.   Objective:   Physical Exam:  There were no vitals taken for this visit.  Physical Exam Neurological:     Mental Status: She is alert and oriented to person, place, and time.     Cranial Nerves: No dysarthria.  Psychiatric:        Attention and Perception: Attention normal.        Mood and Affect: Mood is anxious and depressed.        Speech: Speech normal.        Behavior: Behavior is cooperative.        Thought Content: Thought content is not paranoid or delusional. Thought content does not include homicidal or suicidal ideation. Thought content does not include homicidal or suicidal plan.        Cognition and Memory: Cognition and memory normal.        Judgment: Judgment normal.     Comments: Much worse anxiety and obsessions off Latuda.       Lab Review:     Component Value Date/Time   NA 139 07/01/2018  1103   NA 140 08/15/2012 1624   K 3.8 07/01/2018 1103   K 3.6 08/15/2012 1624   CL 107 (H) 07/01/2018 1103   CL 106 08/15/2012 1624   CO2 21 07/01/2018 1103   CO2 26 08/15/2012 1624   GLUCOSE 88 07/01/2018 1103   GLUCOSE 153 (H) 03/05/2017 2142   GLUCOSE 83 08/15/2012 1624   BUN 8 07/01/2018 1103   BUN 10 08/15/2012 1624   CREATININE 0.56 (L) 07/01/2018 1103   CREATININE 0.55 (L) 08/15/2012 1624   CALCIUM 8.3 (L) 07/01/2018 1103   CALCIUM 9.0 08/15/2012 1624   PROT 5.6 (L) 07/01/2018 1103   PROT 6.5 08/15/2012 1624   ALBUMIN 3.4 (L) 07/01/2018 1103   ALBUMIN 3.4 08/15/2012 1624   AST 15 07/01/2018 1103   AST 22 08/15/2012 1624   ALT 12 07/01/2018 1103   ALT 23 08/15/2012 1624   ALKPHOS 69 07/01/2018 1103   ALKPHOS 98 08/15/2012 1624   BILITOT 0.5 07/01/2018 1103   BILITOT 0.8 08/15/2012 1624   GFRNONAA 112 07/01/2018 1103   GFRNONAA >60 08/15/2012 1624   GFRAA 129 07/01/2018 1103   GFRAA >60 08/15/2012 1624       Component Value Date/Time   WBC 7.5 10/08/2018 1603   WBC 6.9 12/21/2017 1505   RBC 4.68 10/08/2018 1603   RBC 4.76 12/21/2017 1505   HGB 14.2 10/08/2018 1603   HCT 43.4 10/08/2018 1603   PLT 371 10/08/2018 1603   MCV 93 10/08/2018 1603   MCV 91 08/15/2012 1624   MCH 30.3 10/08/2018 1603   MCH 31.1 12/21/2017 1505   MCHC 32.7 10/08/2018 1603   MCHC 33.4 12/21/2017 1505   RDW 12.9 10/08/2018 1603   RDW 15.7 (H) 08/15/2012 1624   LYMPHSABS 1.3 12/21/2017  1505   LYMPHSABS 1.5 10/08/2015 1107   LYMPHSABS 1.9 08/15/2012 1624   MONOABS 0.6 12/21/2017 1505   MONOABS 0.8 08/15/2012 1624   EOSABS 0.3 12/21/2017 1505   EOSABS 0.1 10/08/2015 1107   EOSABS 0.2 08/15/2012 1624   BASOSABS 0.1 12/21/2017 1505   BASOSABS 0.0 10/08/2015 1107   BASOSABS 0.0 08/15/2012 1624    No results found for: POCLITH, LITHIUM   No results found for: PHENYTOIN, PHENOBARB, VALPROATE, CBMZ   .res Assessment: Plan:    Bipolar I disorder (HCC)  Mixed obsessional  thoughts and acts  Generalized anxiety disorder  Panic disorder with agoraphobia  Insomnia due to mental condition  Nightmare   Greater than 50% of face to face time with patient was spent on counseling and coordination of care. We discussed Good response and stability with psych meds.  However had to stop [name] which is working.  This is very unfortunate.  Since stopping the [name] she is not had marked depression or mania but had unexpected recurrence of obsessive-compulsive symptoms.  She had these types of symptoms as a teenager but has not had severe symptoms as an adult and recent years.  Generally OCD and anxiety disorders are treated with serotonin meds but because she is bipolar that increases the risk of mood swings which would rather avoid as long as possible.  Disc alternative of return to Vraylar but it's likely to be expensive also.  But we are able to supply samples and she may be able to take it for a few months at minimal cost.  She is aware that this is also a branded product but there are discount card tends to be more helpful than as the card with Latuda.  As noted she is failed multiple other medications.  She was worried about having the akathisia and physical anxiety that she had when she was on Vraylar 3 mg daily and so has not wanted to take it again.  We discussed however we could use it at 25% of the previous dose but using 1.5 mg every other day.  However we will defer this option.  Discussed potential metabolic side effects associated with atypical antipsychotics, as well as potential risk for movement side effects. Advised pt to contact office if movement side effects occur.   Increase the Trileptal to 900 mg nightly.  No other med changes today. This type of medication is not necessarily typically helpful for OCD but can be helpful for general anxiety and panic disorder off label at times.  This is the most conservative change because she tolerates the medication.   We may have to choose another option such as low-dose SSRI or another atypical if her symptoms do not improved Discussed side effects of each med in detail.  We discussed the short-term risks associated with benzodiazepines including sedation and increased fall risk among others.  Discussed long-term side effect risk including dependence, potential withdrawal symptoms, and the potential eventual dose-related risk of dementia. Use LED  This was a 30-minute appointment  FU 4-6 weeks  Meredith Staggers, MD, DFAPA   Please see After Visit Summary for patient specific instructions.  Future Appointments  Date Time Provider Department Center  05/22/2019  9:00 AM Cottle, Steva Ready., MD [facility] None    No orders of the defined types were placed in this encounter.     -------------------------------

## 2019-05-10 ENCOUNTER — Other Ambulatory Visit: Payer: Self-pay | Admitting: Family Medicine

## 2019-05-10 DIAGNOSIS — G44209 Tension-type headache, unspecified, not intractable: Secondary | ICD-10-CM

## 2019-05-22 ENCOUNTER — Other Ambulatory Visit: Payer: Self-pay

## 2019-05-22 ENCOUNTER — Encounter: Payer: Self-pay | Admitting: Psychiatry

## 2019-05-22 ENCOUNTER — Ambulatory Visit: Payer: BLUE CROSS/BLUE SHIELD | Admitting: Psychiatry

## 2019-05-22 DIAGNOSIS — F411 Generalized anxiety disorder: Secondary | ICD-10-CM

## 2019-05-22 DIAGNOSIS — F422 Mixed obsessional thoughts and acts: Secondary | ICD-10-CM

## 2019-05-22 DIAGNOSIS — F319 Bipolar disorder, unspecified: Secondary | ICD-10-CM

## 2019-05-22 DIAGNOSIS — F4001 Agoraphobia with panic disorder: Secondary | ICD-10-CM

## 2019-05-22 DIAGNOSIS — F5105 Insomnia due to other mental disorder: Secondary | ICD-10-CM

## 2019-05-22 NOTE — Progress Notes (Signed)
Kristen [name] [id number] 1971-04-02 48 y.o.  Virtual Visit via Telephone Note  I connected with pt by telephone and verified that I am speaking with the correct person using two identifiers.   I discussed the limitations, risks, security and privacy concerns of performing an evaluation and management service by telephone and the availability of in person appointments. I also discussed with the patient that there may be a patient responsible charge related to this service. The patient expressed understanding and agreed to proceed.  I discussed the assessment and treatment plan with the patient. The patient was provided an opportunity to ask questions and all were answered. The patient agreed with the plan and demonstrated an understanding of the instructions.   The patient was advised to call back or seek an in-person evaluation if the symptoms worsen or if the condition fails to improve as anticipated.  I provided 10 minutes of non-face-to-face time during this encounter. The call started at 9:00 and ended at 910. The patient was located at home and the provider was located office.  Subjective:   Patient ID:  Kristen [name] is a 48 y.o. (DOB 1971-10-06) female.  Chief Complaint:  Chief Complaint  Patient presents with  . Follow-up    Medication Management  . Anxiety    Medication Management    Depression         Associated symptoms include no decreased concentration and no suicidal ideas.  Past medical history includes anxiety.   Anxiety  Patient reports no confusion, decreased concentration, nervous/anxious behavior or suicidal ideas.     Kristen [name] presents  today for follow-up of bipolar and anxiety.    Last visit May 19 and she tried increase Trileptal 900 mg HS daily.  Tolerated fine.  No SE.  Needs more time to evaluate effectiveness. EMA and Sunday only 4 hours sleep.  No other manic type sx but anxiety and worse last few days.  Stress socially is affecting them.   Just a few nightmares lately.  She had been having more.  visit  March 10 2019.  She could not t afford Latuda $800+ monthly and yet it had worked well.  She's failed multiple other meds.  We had to wean her off of Latuda knowing that could lead to relapse.      Had lost a good bit of weight since being back on Latuda and Wellbutrin.  PE including labs are normal.   Past Psychiatric Medication Trials: Vraylar 3 restless, Latuda 80, oxcarbazine 900 SE,   risperidone nonresponse, lamotrigine 300, pramipexole, Seroquel weight gain, Abilify 10, lithium, duloxetine, Lexapro, Wellbutrin, fluoxetine, venlafaxine, sertraline, oxcarbazepine, clonazepam, alprazolam, buspar NR  Review of Systems:  No tremor, no weakness.  Medications: I have reviewed the patient's current medications.  Current Outpatient Medications  Medication Sig Dispense Refill  . ALPRAZolam (XANAX) 0.5 MG tablet Take 1 tablet (0.5 mg total) by mouth 3 (three) times daily as needed for anxiety or sleep. 0.5 mg  Three times daily and 1 mg at bedtime (Patient taking differently: Take 0.5 mg by mouth. 0.5 mg  Three times daily and 1 mg at bedtime) 270 tablet 1  . buPROPion (WELLBUTRIN XL) 150 MG 24 hr tablet Take 1 tablet (150 mg total) by mouth every morning. 90 tablet 1  . lamoTRIgine (LAMICTAL) 200 MG tablet TAKE 1 TABLET BY MOUTH DAILY 90 tablet 0  . levonorgestrel (MIRENA) 20 MCG/24HR IUD by Intrauterine route.    . Oxcarbazepine (TRILEPTAL) 300 MG tablet  Take 3 tablets (900 mg total) by mouth daily. 270 tablet 0  . propranolol (INDERAL) 20 MG tablet TAKE 1 TO 2 TABLETS BY MOUTH 3 TIMES DAILY AS NEEDED FOR RESTLESSNESS 180 tablet 4  . rivaroxaban (XARELTO) 10 MG TABS tablet Take 1 tablet (10 mg total) by mouth daily. 30 tablet 5   No current facility-administered medications for this visit.     Medication Side Effects: None  Allergies:  Allergies  Allergen Reactions  . Codeine Nausea Only    Past Medical History:   Diagnosis Date  . Anemia   . Anxiety   . Bipolar disorder (HCC)   . Cholelithiasis   . DVT (deep venous thrombosis) (HCC)   . GERD (gastroesophageal reflux disease)   . Kidney stones   . Pulmonary embolism (HCC)   . RLS (restless legs syndrome)     Family History  Problem Relation Age of Onset  . Diabetes Father   . Stroke Father   . Heart attack Father   . Kidney failure Father   . Hypertension Father   . Uterine cancer Maternal Grandmother   . Ulcers Maternal Grandmother   . Diabetes Maternal Grandfather   . Stroke Maternal Grandfather   . Breast cancer Paternal Grandmother   . Colon cancer Paternal Grandfather   . Hypertension Paternal Grandfather     Social History   Socioeconomic History  . Marital status: Married    Spouse name: Not on file  . Number of children: 3  . Years of education: Not on file  . Highest education level: Not on file  Occupational History  . Occupation: Software engineerteacher's assistant    Employer: [employer]  Social Needs  . Financial resource strain: Not on file  . Food insecurity:    Worry: Not on file    Inability: Not on file  . Transportation needs:    Medical: Not on file    Non-medical: Not on file  Tobacco Use  . Smoking status: Never Smoker  . Smokeless tobacco: Never Used  Substance and Sexual Activity  . Alcohol use: No    Alcohol/week: 0.0 standard drinks  . Drug use: No  . Sexual activity: Yes    Partners: Male    Birth control/protection: I.U.D.    Comment: mirena  Lifestyle  . Physical activity:    Days per week: Not on file    Minutes per session: Not on file  . Stress: Not on file  Relationships  . Social connections:    Talks on phone: Not on file    Gets together: Not on file    Attends religious service: Not on file    Active member of club or organization: Not on file    Attends meetings of clubs or organizations: Not on file    Relationship status: Not on file  . Intimate partner violence:     Fear of current or ex partner: Not on file    Emotionally abused: Not on file    Physically abused: Not on file    Forced sexual activity: Not on file  Other Topics Concern  . Not on file  Social History Narrative  . Not on file    Past Medical History, Surgical history, Social history, and Family history were reviewed and updated as appropriate.   Please see review of systems for further details on the patient's review from today.   Objective:   Physical Exam:  There were no vitals taken for this visit.  Physical Exam Neurological:     Mental Status: She is alert and oriented to person, place, and time.     Cranial Nerves: No dysarthria.  Psychiatric:        Attention and Perception: Attention normal.        Mood and Affect: Mood is anxious and depressed.        Speech: Speech normal.        Behavior: Behavior is cooperative.        Thought Content: Thought content is not paranoid or delusional. Thought content does not include homicidal or suicidal ideation. Thought content does not include homicidal or suicidal plan.        Cognition and Memory: Cognition and memory normal.        Judgment: Judgment normal.     Comments: Much worse anxiety and obsessions off Latuda.       Lab Review:     Component Value Date/Time   NA 139 07/01/2018 1103   NA 140 08/15/2012 1624   K 3.8 07/01/2018 1103   K 3.6 08/15/2012 1624   CL 107 (H) 07/01/2018 1103   CL 106 08/15/2012 1624   CO2 21 07/01/2018 1103   CO2 26 08/15/2012 1624   GLUCOSE 88 07/01/2018 1103   GLUCOSE 153 (H) 03/05/2017 2142   GLUCOSE 83 08/15/2012 1624   BUN 8 07/01/2018 1103   BUN 10 08/15/2012 1624   CREATININE 0.56 (L) 07/01/2018 1103   CREATININE 0.55 (L) 08/15/2012 1624   CALCIUM 8.3 (L) 07/01/2018 1103   CALCIUM 9.0 08/15/2012 1624   PROT 5.6 (L) 07/01/2018 1103   PROT 6.5 08/15/2012 1624   ALBUMIN 3.4 (L) 07/01/2018 1103   ALBUMIN 3.4 08/15/2012 1624   AST 15 07/01/2018 1103   AST 22 08/15/2012  1624   ALT 12 07/01/2018 1103   ALT 23 08/15/2012 1624   ALKPHOS 69 07/01/2018 1103   ALKPHOS 98 08/15/2012 1624   BILITOT 0.5 07/01/2018 1103   BILITOT 0.8 08/15/2012 1624   GFRNONAA 112 07/01/2018 1103   GFRNONAA >60 08/15/2012 1624   GFRAA 129 07/01/2018 1103   GFRAA >60 08/15/2012 1624       Component Value Date/Time   WBC 7.5 10/08/2018 1603   WBC 6.9 12/21/2017 1505   RBC 4.68 10/08/2018 1603   RBC 4.76 12/21/2017 1505   HGB 14.2 10/08/2018 1603   HCT 43.4 10/08/2018 1603   PLT 371 10/08/2018 1603   MCV 93 10/08/2018 1603   MCV 91 08/15/2012 1624   MCH 30.3 10/08/2018 1603   MCH 31.1 12/21/2017 1505   MCHC 32.7 10/08/2018 1603   MCHC 33.4 12/21/2017 1505   RDW 12.9 10/08/2018 1603   RDW 15.7 (H) 08/15/2012 1624   LYMPHSABS 1.3 12/21/2017 1505   LYMPHSABS 1.5 10/08/2015 1107   LYMPHSABS 1.9 08/15/2012 1624   MONOABS 0.6 12/21/2017 1505   MONOABS 0.8 08/15/2012 1624   EOSABS 0.3 12/21/2017 1505   EOSABS 0.1 10/08/2015 1107   EOSABS 0.2 08/15/2012 1624   BASOSABS 0.1 12/21/2017 1505   BASOSABS 0.0 10/08/2015 1107   BASOSABS 0.0 08/15/2012 1624    No results found for: POCLITH, LITHIUM   No results found for: PHENYTOIN, PHENOBARB, VALPROATE, CBMZ   .res Assessment: Plan:    Bipolar I disorder (HCC)  Generalized anxiety disorder  Mixed obsessional thoughts and acts  Panic disorder with agoraphobia  Insomnia due to mental condition   Greater than 50% of face to face time with patient was spent  on counseling and coordination of care. We discussed Good response and stability with psych meds.  However had to stop [name] which is working.  This is very unfortunate.  Since stopping the [name] she is not had marked depression or mania but had unexpected recurrence of obsessive-compulsive symptoms.  She had these types of symptoms as a teenager but has not had severe symptoms as an adult and recent years.  Generally OCD and anxiety disorders are treated with  serotonin meds but because she is bipolar that increases the risk of mood swings which would rather avoid as long as possible.  Disc alternative of return to Vraylar but it's likely to be expensive also.  But we are able to supply samples and she may be able to take it for a few months at minimal cost.  She is aware that this is also a branded product but there are discount card tends to be more helpful than as the card with Latuda.  As noted she is failed multiple other medications.  She was worried about having the akathisia and physical anxiety that she had when she was on Vraylar 3 mg daily and so has not wanted to take it again.  We discussed however we could use it at 25% of the previous dose but using 1.5 mg every other day.  However we will defer this option.  Discussed potential metabolic side effects associated with atypical antipsychotics, as well as potential risk for movement side effects. Advised pt to contact office if movement side effects occur.   Increase the Trileptal to 900 mg nightly.  Try some in daytime to see if anxiety is better.  Informed her that we may have to go much higher and to particularly let us know if this insomnia persist. This type of medication is not necessarily typically helpful for OCD but can be helpful for general anxiety and panic disorder off label at times.  This is the most conservative change because she tolerates the medication.  We may have to choose another option such as low-dose SSRI or another atypical if her symptoms do not improved Discussed side effects of each med in detail.  We discussed the short-term risks associated with benzodiazepines including sedation and increased fall risk among others.  Discussed long-term side effect risk including dependence, potential withdrawal symptoms, and the potential eventual dose-related risk of dementia. Use LED  FU 4-6 weeks  Meredith Staggers, MD, DFAPA   Please see After Visit Summary for patient  specific instructions.  No future appointments.  No orders of the defined types were placed in this encounter.     -------------------------------

## 2019-06-13 ENCOUNTER — Other Ambulatory Visit: Payer: Self-pay | Admitting: Psychiatry

## 2019-06-13 NOTE — Telephone Encounter (Signed)
Last fill 05/30 #120

## 2019-06-19 ENCOUNTER — Other Ambulatory Visit: Payer: Self-pay | Admitting: Psychiatry

## 2019-07-01 ENCOUNTER — Other Ambulatory Visit: Payer: Self-pay | Admitting: Psychiatry

## 2019-07-01 NOTE — Telephone Encounter (Signed)
Kristen [name] called to report that she has been her PCP for tension Headaches.  He just prescribed gabapentin 100mg  for the headaches.  She wants to know if it is ok to take this with her medications.  Please call.

## 2019-07-01 NOTE — Telephone Encounter (Signed)
Pt aware okay to take gabapentin with her other medications.

## 2019-07-10 ENCOUNTER — Emergency Department: Payer: PRIVATE HEALTH INSURANCE

## 2019-07-10 ENCOUNTER — Emergency Department
Admission: EM | Admit: 2019-07-10 | Discharge: 2019-07-10 | Disposition: A | Discharge status: Home / Self Care | Payer: PRIVATE HEALTH INSURANCE | Attending: Emergency Medicine | Admitting: Emergency Medicine

## 2019-07-10 ENCOUNTER — Encounter: Payer: Self-pay | Admitting: Emergency Medicine

## 2019-07-10 ENCOUNTER — Other Ambulatory Visit: Payer: Self-pay

## 2019-07-10 DIAGNOSIS — F0781 Postconcussional syndrome: Secondary | ICD-10-CM | POA: Diagnosis not present

## 2019-07-10 DIAGNOSIS — W228XXA Striking against or struck by other objects, initial encounter: Secondary | ICD-10-CM | POA: Insufficient documentation

## 2019-07-10 DIAGNOSIS — Y929 Unspecified place or not applicable: Secondary | ICD-10-CM | POA: Diagnosis not present

## 2019-07-10 DIAGNOSIS — Y9389 Activity, other specified: Secondary | ICD-10-CM | POA: Diagnosis not present

## 2019-07-10 DIAGNOSIS — Y999 Unspecified external cause status: Secondary | ICD-10-CM | POA: Diagnosis not present

## 2019-07-10 DIAGNOSIS — Z79899 Other long term (current) drug therapy: Secondary | ICD-10-CM | POA: Insufficient documentation

## 2019-07-10 DIAGNOSIS — Z7901 Long term (current) use of anticoagulants: Secondary | ICD-10-CM | POA: Diagnosis not present

## 2019-07-10 DIAGNOSIS — G44309 Post-traumatic headache, unspecified, not intractable: Secondary | ICD-10-CM | POA: Insufficient documentation

## 2019-07-10 DIAGNOSIS — S0990XA Unspecified injury of head, initial encounter: Secondary | ICD-10-CM | POA: Diagnosis present

## 2019-07-10 MED ORDER — METOCLOPRAMIDE HCL 10 MG PO TABS
10.0000 mg | ORAL_TABLET | Freq: Once | ORAL | Status: AC
  Administered 2019-07-10: 13:00:00 10 mg via ORAL
  Filled 2019-07-10: qty 1

## 2019-07-10 MED ORDER — DEXAMETHASONE SODIUM PHOSPHATE 10 MG/ML IJ SOLN
10.0000 mg | Freq: Once | INTRAMUSCULAR | Status: AC
  Administered 2019-07-10: 10 mg via INTRAMUSCULAR
  Filled 2019-07-10: qty 1

## 2019-07-10 MED ORDER — METOCLOPRAMIDE HCL 5 MG PO TABS: 5.0000 mg | ORAL_TABLET | Freq: Three times a day (TID) | ORAL | 0 refills | Status: DC | PRN

## 2019-07-10 MED ORDER — CYCLOBENZAPRINE HCL 5 MG PO TABS: 5.0000 mg | ORAL_TABLET | Freq: Three times a day (TID) | ORAL | 0 refills | Status: DC | PRN

## 2019-07-10 NOTE — ED Triage Notes (Signed)
Pt states fell on Tuesday night, pt presents today with c/o HA. PT is A&O x4, NAD noted at this time. Pt states does take a blood thinner at this time.

## 2019-07-10 NOTE — Discharge Instructions (Signed)
Your exam and CT scan are normal and reassuring following your fall. You have symptoms consistent with a post-concussive syndrome. Your symptoms should improve in a few days. Take the meds as needed. Follow-up with your provider or return for worsening symptoms.

## 2019-07-10 NOTE — ED Provider Notes (Signed)
[facility] Emergency Department Provider Note ____________________________________________  Time seen: 1325  I have reviewed the triage vital signs and the nursing notes.  HISTORY  Chief Complaint  Fall  HPI Kristen [name] is a 48 y.o. female presents to the ED for evaluation of injury sustained after a slip and fall onto a brick wall while coming to the house.  Patient describes she hit the top of her head on the brick wallslip fall into brick wall coming into house.  She denies any loss of consciousness, and also denies any laceration to the top of the head.  Patient was concerned because she continued to have head pain as well as persistent headache and nausea without vomiting.  She was also concerned because she takes Xarelto for history of DVT/PE.  Patient denies any vomiting, weakness, distal paresthesias, or syncope.  She does experience some light sensitivity as well as noticing some difficulty with focusing.  She presents for further evaluation of her symptoms.  Past Medical History:  Diagnosis Date  . Anemia   . Anxiety   . Bipolar disorder (HCC)   . Cholelithiasis   . DVT (deep venous thrombosis) (HCC)   . GERD (gastroesophageal reflux disease)   . Kidney stones   . Pulmonary embolism (HCC)   . RLS (restless legs syndrome)     Patient Active Problem List   Diagnosis Date Noted  . GAD (generalized anxiety disorder) 09/27/2018  . Panic 09/27/2018  . Iron deficiency anemia 03/10/2017  . Unexplained weight loss 03/10/2017  . Family history of colon cancer 03/09/2017  . Hypokalemia 03/09/2017  . Sleep disorder 12/14/2015  . Allergic rhinitis, seasonal 05/20/2015  . Abnormal cells of cervix 05/20/2015  . Complication of surgery 05/20/2015  . Deep phlebitis-leg (HCC) 05/20/2015  . Fatty infiltration of liver 05/20/2015  . Bariatric surgery status 05/20/2015  . Iron deficiency 05/20/2015  . Personal history of PE (pulmonary embolism) 05/20/2015   . Restless leg 05/20/2015  . B12 deficiency 05/20/2015  . History of anticoagulant therapy 05/20/2015  . Abnormal blood sugar 12/06/2009  . Vitamin D deficiency 10/01/2009  . Chronic nonalcoholic liver disease 08/27/2009  . Hypercholesteremia 10/08/2007  . Polycystic ovarian syndrome 02/14/2007  . OBESITY, NOS 02/14/2007  . BIPOLAR DISORDER 02/14/2007    Past Surgical History:  Procedure Laterality Date  . BREAST BIOPSY Bilateral 2013/2014   neg  . CERVICAL BIOPSY  W/ LOOP ELECTRODE EXCISION  2007  . CHOLECYSTECTOMY    . COLONOSCOPY WITH PROPOFOL N/A 04/03/2017   Procedure: COLONOSCOPY WITH PROPOFOL;  Surgeon: Wyline MoodKiran Anna, MD;  [facility];  Service: Endoscopy;  Laterality: N/A;  . ESOPHAGOGASTRODUODENOSCOPY (EGD) WITH PROPOFOL N/A 04/03/2017   Procedure: ESOPHAGOGASTRODUODENOSCOPY (EGD) WITH PROPOFOL;  Surgeon: Wyline MoodKiran Anna, MD;  Location: [facility] ENDOSCOPY;  Service: Endoscopy;  Laterality: N/A;  . GASTRIC BYPASS  about 2010   initially lost 158 pounds  . HERNIA REPAIR    . kidney stone removal    . KNEE ARTHROSCOPY    . TONSILLECTOMY      Prior to Admission medications   Medication Sig Start Date End Date Taking? Authorizing Provider  ALPRAZolam Prudy Feeler(XANAX) 0.5 MG tablet Take 1 tablet (0.5 mg) three times a day as needed for anxiety or sleep, take 1 tablet at bedtime. 06/13/19  Yes Cottle, Steva Readyarey G Jr., MD  buPROPion (WELLBUTRIN XL) 150 MG 24 hr tablet TAKE 1 TABLET BY MOUTH EVERY MORNING Patient taking differently: Take 150 mg by mouth daily.  06/20/19  Yes Cottle, Billey Co., MD  lamoTRIgine (LAMICTAL) 200 MG tablet TAKE ONE TABLET BY MOUTH EVERY DAY Patient taking differently: Take 200 mg by mouth daily.  07/01/19  Yes Cottle, Billey Co., MD  Oxcarbazepine (TRILEPTAL) 300 MG tablet Take 3 tablets (900 mg total) by mouth daily. Patient taking differently: Take 300-600 mg by mouth daily. Take one tablet (300 mg) by mouth each morning and two tablets (600 mg) at bedtime  05/06/19  Yes Cottle, Billey Co., MD  propranolol (INDERAL) 20 MG tablet TAKE 1 TO 2 TABLETS BY MOUTH 3 TIMES DAILY AS NEEDED FOR RESTLESSNESS 01/27/19  Yes Cottle, Billey Co., MD  rivaroxaban (XARELTO) 10 MG TABS tablet Take 1 tablet (10 mg total) by mouth daily. 02/26/19  Yes Birdie Sons, MD  cyclobenzaprine (FLEXERIL) 5 MG tablet Take 1 tablet (5 mg total) by mouth 3 (three) times daily as needed. 07/10/19   Fable Huisman, Dannielle Karvonen, PA-C  levonorgestrel (MIRENA) 20 MCG/24HR IUD by Intrauterine route. 10/08/07   [provider]  metoCLOPramide (REGLAN) 5 MG tablet Take 1 tablet (5 mg total) by mouth every 8 (eight) hours as needed for up to 5 days for nausea or vomiting. 07/10/19 07/15/19  Devian Bartolomei, Dannielle Karvonen, PA-C    Allergies Codeine  Family History  Problem Relation Age of Onset  . Diabetes Father   . Stroke Father   . Heart attack Father   . Kidney failure Father   . Hypertension Father   . Uterine cancer Maternal Grandmother   . Ulcers Maternal Grandmother   . Diabetes Maternal Grandfather   . Stroke Maternal Grandfather   . Breast cancer Paternal Grandmother   . Colon cancer Paternal Grandfather   . Hypertension Paternal Grandfather     Social History Social History   Tobacco Use  . Smoking status: Never Smoker  . Smokeless tobacco: Never Used  Substance Use Topics  . Alcohol use: No    Alcohol/week: 0.0 standard drinks  . Drug use: No    Review of Systems  Constitutional: Negative for fever. Eyes: Negative for visual changes. ENT: Negative for sore throat. Cardiovascular: Negative for chest pain. Respiratory: Negative for shortness of breath. Gastrointestinal: Negative for abdominal pain, vomiting and diarrhea. Genitourinary: Negative for dysuria. Musculoskeletal: Negative for back pain. Skin: Negative for rash. Neurological: Negative for headaches, focal weakness or numbness. ____________________________________________  PHYSICAL  EXAM:  VITAL SIGNS: ED Triage Vitals  Enc Vitals Group     BP 07/10/19 1120 110/74     Pulse Rate 07/10/19 1120 (!) 58     Resp 07/10/19 1120 18     Temp 07/10/19 1120 98.6 F (37 C)     Temp Source 07/10/19 1120 Oral     SpO2 07/10/19 1120 99 %     Weight 07/10/19 1121 155 lb (70.3 kg)     Height 07/10/19 1121 5\' 8"  (1.727 m)     Head Circumference --      Peak Flow --      Pain Score 07/10/19 1121 10     Pain Loc --      Pain Edu? --      Excl. in [place]? --     Constitutional: Alert and oriented. Well appearing and in no distress.  GCS =15 Head: Normocephalic and atraumatic.  No bruise abrasion, or hematoma noted to the crown of the skull.  No battle sign appreciated. Eyes: Conjunctivae are normal. PERRL. Normal extraocular movements and fundi bilaterally Ears:  Canals clear. TMs intact bilaterally.  No hemotympanum. Nose: No congestion/rhinorrhea/epistaxis. Mouth/Throat: Mucous membranes are moist. Neck: Supple. Normal ROM  Cardiovascular: Normal rate, regular rhythm. Normal distal pulses. Respiratory: Normal respiratory effort. No wheezes/rales/rhonchi. Gastrointestinal: Soft and nontender. No distention. Musculoskeletal: Nontender with normal range of motion in all extremities.  Neurologic: Cranial nerves II through XII grossly intact.  Normal UEs/LE DTRs bilaterally.  Normal finger-to-nose exam.  No pronator drift appreciated.  Normal tandem walk on exam.  No indication of any cerebellar ataxia.  Normal gait without ataxia. Normal speech and language. No gross focal neurologic deficits are appreciated. Skin:  Skin is warm, dry and intact. No rash noted. Psychiatric: Mood and affect are normal. Patient exhibits appropriate insight and judgment. ____________________________________________   RADIOLOGY  Head CT w/o CM  IMPRESSION: - No evidence of acute intracranial abnormality - Small left maxillary sinus air-fluid  level. ____________________________________________  PROCEDURES  Procedures Decadron 10 mg IM Reglan 5 mg PO ____________________________________________  INITIAL IMPRESSION / ASSESSMENT AND PLAN / ED COURSE  Kristen [name] was evaluated in Emergency Department on 07/11/2019 for the symptoms described in the history of present illness. She was evaluated in the context of the global COVID-19 pandemic, which necessitated consideration that the patient might be at risk for infection with the SARS-CoV-2 virus that causes COVID-19. Institutional protocols and algorithms that pertain to the evaluation of patients at risk for COVID-19 are in a state of rapid change based on information released by regulatory bodies including the CDC and federal and state organizations. These policies and algorithms were followed during the patient's care in the ED.  Patient with ED evaluation of persistent headache and nausea following a head contusion.  Patient was concerned because she is on anticoagulation therapy.  She is reassured by her negative head CT at this time.  Discussed with the patient the sometimes protracted course of postconcussive syndrome.  Her symptoms are consistent with a postconcussive syndrome without loss of consciousness.  She does report improvement of her symptoms after ED medication administration.  Patient will be discharged with prescriptions for cyclobenzaprine and Reglan.  She may take over-the-counter Tylenol with her anticoagulants.  Return precautions are reviewed with the patient and she verbalizes understanding. ____________________________________________  FINAL CLINICAL IMPRESSION(S) / ED DIAGNOSES  Final diagnoses:  Minor head injury, initial encounter  Post concussion syndrome       Lissa HoardMenshew, Jordis Repetto V Bacon, PA-C 07/11/19 2152    Concha SeFunke, Mary E, MD 07/14/19 [phone]

## 2019-07-10 NOTE — ED Notes (Signed)
See triage note  States she lost her balance   [address] hitting her head on brick last pm  No bleeding noted  But conts' to have h/a

## 2019-07-16 ENCOUNTER — Telehealth: Payer: Self-pay

## 2019-07-16 ENCOUNTER — Ambulatory Visit
Admission: RE | Admit: 2019-07-16 | Discharge: 2019-07-16 | Disposition: A | Discharge status: Home / Self Care | Payer: PRIVATE HEALTH INSURANCE | Source: Ambulatory Visit | Attending: Family Medicine | Admitting: Family Medicine

## 2019-07-16 ENCOUNTER — Other Ambulatory Visit: Payer: Self-pay

## 2019-07-16 ENCOUNTER — Ambulatory Visit (INDEPENDENT_AMBULATORY_CARE_PROVIDER_SITE_OTHER): Payer: PRIVATE HEALTH INSURANCE | Admitting: Family Medicine

## 2019-07-16 ENCOUNTER — Encounter: Payer: Self-pay | Admitting: Family Medicine

## 2019-07-16 VITALS — BP 100/67 | HR 65 | Temp 98.4°F | Wt 155.0 lb

## 2019-07-16 DIAGNOSIS — R110 Nausea: Secondary | ICD-10-CM

## 2019-07-16 DIAGNOSIS — L568 Other specified acute skin changes due to ultraviolet radiation: Secondary | ICD-10-CM | POA: Insufficient documentation

## 2019-07-16 DIAGNOSIS — G44311 Acute post-traumatic headache, intractable: Secondary | ICD-10-CM

## 2019-07-16 MED ORDER — GABAPENTIN 100 MG PO CAPS: 100.0000 mg | ORAL_CAPSULE | Freq: Three times a day (TID) | ORAL | 1 refills | Status: DC

## 2019-07-16 NOTE — Telephone Encounter (Signed)
-----   Message from Birdie Sons, MD sent at 07/16/2019 12:14 PM EDT ----- CT head is normal, no bleeding. Symptoms may last a few weeks. Recommend take gabapentin for a few weeks, [facility] Prescription was written for 1 cap three times a day, but she can go up to 2 caps three times a day if not much better in 3-4 days.

## 2019-07-16 NOTE — Progress Notes (Signed)
Patient: Kristen [name] Female    DOB: Nov 24, 1971   48 y.o.   MRN: [id number] Visit Date: 07/16/2019  Today's Provider: Lelon Huh, MD   Chief Complaint  Patient presents with  . Follow-up    ER Follow up 07/08/2019   Subjective:     HPI     Follow up ER visit  Patient was seen in ER for head injury on 07/10/2019. She was treated for Concussion. Treatment for this included RX for cyclobenzaprine and reglan. She reports excellent compliance with treatment. She reports this condition is Unchanged. Injury occurred on 07/08/2019.  Started having tingling in face and mouth the last few days.  ------------------------------------------------------------------------------------    Allergies  Allergen Reactions  . Codeine Nausea Only    Can tolerate medication when taken with food     Current Outpatient Medications:  .  ALPRAZolam (XANAX) 0.5 MG tablet, Take 1 tablet (0.5 mg) three times a day as needed for anxiety or sleep, take 1 tablet at bedtime., Disp: 120 tablet, Rfl: 2 .  buPROPion (WELLBUTRIN XL) 150 MG 24 hr tablet, TAKE 1 TABLET BY MOUTH EVERY MORNING (Patient taking differently: Take 150 mg by mouth daily. ), Disp: 30 tablet, Rfl: 5 .  cyclobenzaprine (FLEXERIL) 5 MG tablet, Take 1 tablet (5 mg total) by mouth 3 (three) times daily as needed., Disp: 15 tablet, Rfl: 0 .  lamoTRIgine (LAMICTAL) 200 MG tablet, TAKE ONE TABLET BY MOUTH EVERY DAY (Patient taking differently: Take 200 mg by mouth daily. ), Disp: 90 tablet, Rfl: 0 .  levonorgestrel (MIRENA) 20 MCG/24HR IUD, by Intrauterine route., Disp: , Rfl:  .  Oxcarbazepine (TRILEPTAL) 300 MG tablet, Take 3 tablets (900 mg total) by mouth daily. (Patient taking differently: Take 300-600 mg by mouth daily. Take one tablet (300 mg) by mouth each morning and two tablets (600 mg) at bedtime), Disp: 270 tablet, Rfl: 0 .  propranolol (INDERAL) 20 MG tablet, TAKE 1 TO 2 TABLETS BY MOUTH 3 TIMES DAILY AS NEEDED FOR  RESTLESSNESS, Disp: 180 tablet, Rfl: 4 .  rivaroxaban (XARELTO) 10 MG TABS tablet, Take 1 tablet (10 mg total) by mouth daily., Disp: 30 tablet, Rfl: 5 .  metoCLOPramide (REGLAN) 5 MG tablet, Take 1 tablet (5 mg total) by mouth every 8 (eight) hours as needed for up to 5 days for nausea or vomiting., Disp: 15 tablet, Rfl: 0  Review of Systems  Constitutional: Positive for fatigue. Negative for activity change, appetite change, chills, diaphoresis, fever and unexpected weight change.  Eyes: Positive for photophobia.  Respiratory: Negative.   Gastrointestinal: Positive for nausea. Negative for abdominal distention, abdominal pain, anal bleeding, blood in stool, constipation, diarrhea, rectal pain and vomiting.  Musculoskeletal: Negative.  Negative for neck pain and neck stiffness.  Neurological: Positive for dizziness and headaches. Negative for light-headedness.    Social History   Tobacco Use  . Smoking status: Never Smoker  . Smokeless tobacco: Never Used  Substance Use Topics  . Alcohol use: No    Alcohol/week: 0.0 standard drinks      Objective:   BP 100/67 (BP Location: Right Arm, Patient Position: Sitting, Cuff Size: Normal)   Pulse 65   Temp 98.4 F (36.9 C) (Oral)   Wt 155 lb (70.3 kg)   BMI 23.57 kg/m  Vitals:   07/16/19 0948  BP: 100/67  Pulse: 65  Temp: 98.4 F (36.9 C)  TempSrc: Oral  Weight: 155 lb (70.3 kg)  Physical Exam    General Appearance:    Alert, cooperative, no distress  Eyes:    PERRL, conjunctiva/corneas clear, EOM's intact       Lungs:     Clear to auscultation bilaterally, respirations unlabored  Heart:    Normal heart rate. Normal rhythm. No murmurs, rubs, or gallops.   Neurologic:   Awake, alert, oriented x 3. No apparent focal neurological           defect.       Ct Head Wo Contrast  Result Date: 07/16/2019 CLINICAL DATA:  Headache since recent fall EXAM: CT HEAD WITHOUT CONTRAST TECHNIQUE: Contiguous axial images were obtained  from the base of the skull through the vertex without intravenous contrast. COMPARISON:  July 10, 2019 FINDINGS: Brain: The ventricles are normal in size and configuration. There is no intracranial mass, hemorrhage, extra-axial fluid collection, or midline shift. Brain parenchyma appears unremarkable. No acute infarct evident. Vascular: No hyperdense vessel. No appreciable vascular calcification. Skull: Bony calvarium appears intact. Sinuses/Orbits: There is mucosal thickening in several ethmoid air cells. Other visualized paranasal sinuses are clear. Visualized orbits appear symmetric bilaterally. Other: Mastoid air cells are clear. IMPRESSION: Mild ethmoid sinus disease.  Study otherwise unremarkable. Electronically Signed   By: Bretta BangWilliam  Woodruff III M.D.   On: 07/16/2019 11:37        Assessment & Plan    1. Intractable acute post-traumatic headache  - CT Head Wo Contrast; Future  2. Photosensitivity  - CT Head Wo Contrast; Future  3. Nausea  - CT Head Wo Contrast; Future  No sign of bleed on CT. Avoiding TCA due to potential interaction, will try- gabapentin (NEURONTIN) 100 MG capsule; Take 1 capsule (100 mg total) by mouth 3 (three) times daily.  Dispense: 90 capsule; Refill: 1 Advised patient she could double dose if not better in 3-4 days.   The entirety of the information documented in the History of Present Illness, Review of Systems and Physical Exam were personally obtained by me. Portions of this information were initially documented by Kavin LeechLaura Walsh, CMA and reviewed by me for thoroughness and accuracy.      Mila Merryonald Trust Crago, MD  [facility]

## 2019-07-16 NOTE — Patient Instructions (Signed)
.   Please review the attached list of medications and notify my office if there are any errors.   . Please bring all of your medications to every appointment so we can make sure that our medication list is the same as yours.   . We will have flu vaccines available after Labor Day. Please go to your pharmacy or call the office in early September to schedule you flu shot.   

## 2019-07-16 NOTE — Telephone Encounter (Signed)
Pt advised.   Thanks,   -Mayline Dragon  

## 2019-07-21 ENCOUNTER — Ambulatory Visit: Payer: PRIVATE HEALTH INSURANCE | Admitting: Psychiatry

## 2019-08-01 ENCOUNTER — Encounter: Payer: Self-pay | Admitting: Physician Assistant

## 2019-08-01 ENCOUNTER — Other Ambulatory Visit: Payer: Self-pay | Admitting: Family Medicine

## 2019-08-01 ENCOUNTER — Other Ambulatory Visit: Payer: Self-pay

## 2019-08-01 ENCOUNTER — Ambulatory Visit: Payer: PRIVATE HEALTH INSURANCE | Admitting: Physician Assistant

## 2019-08-01 VITALS — BP 100/67 | HR 66 | Temp 97.0°F | Resp 16 | Ht 69.0 in | Wt 162.0 lb

## 2019-08-01 DIAGNOSIS — S139XXA Sprain of joints and ligaments of unspecified parts of neck, initial encounter: Secondary | ICD-10-CM | POA: Diagnosis not present

## 2019-08-01 DIAGNOSIS — F0781 Postconcussional syndrome: Secondary | ICD-10-CM | POA: Diagnosis not present

## 2019-08-01 MED ORDER — PREDNISONE 10 MG (21) PO TBPK: ORAL_TABLET | ORAL | 0 refills | Status: DC

## 2019-08-01 MED ORDER — CYCLOBENZAPRINE HCL 5 MG PO TABS: 5.0000 mg | ORAL_TABLET | Freq: Every day | ORAL | 0 refills | Status: DC

## 2019-08-01 NOTE — Progress Notes (Signed)
Patient: Kristen [name] Female    DOB: [birth year]   48 y.o.   MRN: [id number] Visit Date: 08/01/2019  Today's Provider: Mar Daring, PA-C   Chief Complaint  Patient presents with  . Headache   Subjective:     HPI   Patient is here concerning headache she has had for 3 weeks. Patient believes headache is associated with head injury that occurred 07/10/2019. Patient states she is also having dizziness. She had a head injury and has had head ct (patient is on blood thinners for previous DVT). Head CT was unremarkable. Still having headaches, dizziness, blurred vision and some mild memory issues.   Allergies  Allergen Reactions  . Codeine Nausea Only    Can tolerate medication when taken with food     Current Outpatient Medications:  .  ALPRAZolam (XANAX) 0.5 MG tablet, Take 1 tablet (0.5 mg) three times a day as needed for anxiety or sleep, take 1 tablet at bedtime., Disp: 120 tablet, Rfl: 2 .  buPROPion (WELLBUTRIN XL) 150 MG 24 hr tablet, TAKE 1 TABLET BY MOUTH EVERY MORNING (Patient taking differently: Take 150 mg by mouth daily. ), Disp: 30 tablet, Rfl: 5 .  cyclobenzaprine (FLEXERIL) 5 MG tablet, Take 1 tablet (5 mg total) by mouth 3 (three) times daily as needed., Disp: 15 tablet, Rfl: 0 .  gabapentin (NEURONTIN) 100 MG capsule, Take 1 capsule (100 mg total) by mouth 3 (three) times daily., Disp: 90 capsule, Rfl: 1 .  lamoTRIgine (LAMICTAL) 200 MG tablet, TAKE ONE TABLET BY MOUTH EVERY DAY (Patient taking differently: Take 200 mg by mouth daily. ), Disp: 90 tablet, Rfl: 0 .  levonorgestrel (MIRENA) 20 MCG/24HR IUD, by Intrauterine route., Disp: , Rfl:  .  Oxcarbazepine (TRILEPTAL) 300 MG tablet, Take 3 tablets (900 mg total) by mouth daily. (Patient taking differently: Take 300-600 mg by mouth daily. Take one tablet (300 mg) by mouth each morning and two tablets (600 mg) at bedtime), Disp: 270 tablet, Rfl: 0 .  propranolol (INDERAL) 20 MG tablet, TAKE 1 TO 2 TABLETS  BY MOUTH 3 TIMES DAILY AS NEEDED FOR RESTLESSNESS, Disp: 180 tablet, Rfl: 4 .  rivaroxaban (XARELTO) 10 MG TABS tablet, Take 1 tablet (10 mg total) by mouth daily., Disp: 30 tablet, Rfl: 5 .  metoCLOPramide (REGLAN) 5 MG tablet, Take 1 tablet (5 mg total) by mouth every 8 (eight) hours as needed for up to 5 days for nausea or vomiting., Disp: 15 tablet, Rfl: 0  Review of Systems  Constitutional: Negative for appetite change, chills, fatigue and fever.  HENT: Negative for congestion, ear pain, hearing loss, postnasal drip, rhinorrhea, sinus pressure, sinus pain, sore throat and tinnitus.   Eyes: Positive for photophobia and visual disturbance. Negative for pain, discharge, redness and itching.  Respiratory: Negative for chest tightness and shortness of breath.   Cardiovascular: Negative for chest pain and palpitations.  Gastrointestinal: Negative for abdominal pain, nausea and vomiting.  Neurological: Positive for dizziness and headaches. Negative for weakness.  Psychiatric/Behavioral: Positive for confusion and decreased concentration.    Social History   Tobacco Use  . Smoking status: Never Smoker  . Smokeless tobacco: Never Used  Substance Use Topics  . Alcohol use: No    Alcohol/week: 0.0 standard drinks      Objective:   BP 100/67 (BP Location: Right Arm, Patient Position: Sitting, Cuff Size: Large)   Pulse 66   Temp (!) 97 F (36.1 C) (Oral)  Resp 16   Ht 5\' 9"  (1.753 m)   Wt 162 lb (73.5 kg)   SpO2 97%   BMI 23.92 kg/m  Vitals:   08/01/19 1602  BP: 100/67  Pulse: 66  Resp: 16  Temp: (!) 97 F (36.1 C)  TempSrc: Oral  SpO2: 97%  Weight: 162 lb (73.5 kg)  Height: 5\' 9"  (1.753 m)     Physical Exam Vitals signs reviewed.  Constitutional:      General: She is not in acute distress.    Appearance: She is well-developed and normal weight. She is not ill-appearing or diaphoretic.  Eyes:     General: No visual field deficit. Neck:     Musculoskeletal: Normal  range of motion and neck supple.     Thyroid: No thyromegaly.     Vascular: No JVD.     Trachea: No tracheal deviation.  Cardiovascular:     Rate and Rhythm: Normal rate and regular rhythm.     Heart sounds: Normal heart sounds. No murmur. No friction rub. No gallop.   Pulmonary:     Effort: Pulmonary effort is normal. No respiratory distress.     Breath sounds: Normal breath sounds. No wheezing or rales.  Lymphadenopathy:     Cervical: No cervical adenopathy.  Neurological:     Mental Status: She is alert and oriented to person, place, and time. Mental status is at baseline.     Cranial Nerves: No cranial nerve deficit or facial asymmetry.     Sensory: No sensory deficit.     Motor: No weakness.     Coordination: Romberg sign negative. Coordination normal.     Gait: Gait normal.  Psychiatric:        Mood and Affect: Mood normal. Mood is not anxious or depressed.        Behavior: Behavior normal.      No results found for any visits on 08/01/19.     Assessment & Plan    1. Postconcussive syndrome Discussed course of postconcussive syndrome. Will try prednisone taper as below for headache and continued neck pain. Flexeril also given for neck tension. Discussed possibly considering PT for neck pain. Call if worsening.  - predniSONE (STERAPRED UNI-PAK 21 TAB) 10 MG (21) TBPK tablet; 6 day taper; take as directed on package instructions  Dispense: 21 tablet; Refill: 0 - cyclobenzaprine (FLEXERIL) 5 MG tablet; Take 1 tablet (5 mg total) by mouth at bedtime.  Dispense: 15 tablet; Refill: 0  2. Neck sprain, initial encounter See above medical treatment plan. - predniSONE (STERAPRED UNI-PAK 21 TAB) 10 MG (21) TBPK tablet; 6 day taper; take as directed on package instructions  Dispense: 21 tablet; Refill: 0 - cyclobenzaprine (FLEXERIL) 5 MG tablet; Take 1 tablet (5 mg total) by mouth at bedtime.  Dispense: 15 tablet; Refill: 0  I spent approximately 40 minutes with the patient today.  Over 50% of this time was spent with counseling and educating the patient.    Margaretann LovelessJennifer M Mackinzie Vuncannon, PA-C  [facility]

## 2019-08-01 NOTE — Patient Instructions (Signed)
Post-Concussion Syndrome    A concussion is a brain injury from a direct hit (blow) to your head or body. This blow causes your brain to shake quickly back and forth inside your skull. This can damage brain cells and cause chemical changes in your brain. Concussions are usually not life-threatening but can cause several serious symptoms.  Post-concussion syndrome is when symptoms that occur after a concussion last longer than normal. These symptoms can last from weeks to months.  What are the causes?  The cause of this condition is not known. It can happen whether your head injury was mild or severe.  What increases the risk?  You are more likely to develop this condition if:  · You are female.  · You are a child, teen, or young adult.  · You had a past head injury.  · You have a history of headaches.  · You have depression or anxiety.  What are the signs or symptoms?  Physical symptoms  · Headaches.  · Tiredness.  · Dizziness.  · Weakness.  · Blurry vision.  · Sensitivity to light.  · Hearing difficulties.  Mental and emotional symptoms  · Memory difficulties.  · Difficulty with concentration.  · Difficulty sleeping or staying asleep.  · Feeling irritable.  · Anxiety or depression.  · Difficulty learning new things.  How is this diagnosed?  This condition may be diagnosed based on:  · Your symptoms.  · A description of your injury.  · Your medical history.  Your health care provider may order other tests such as:  · Brain function tests (neurological testing).  · CT scan.  How is this treated?  Treatment for this condition may depend on your symptoms. Symptoms usually go away on their own over time. Treatments may include:  · Medicines for headaches.  · Resting your brain and body for a few days after your injury.  · Rehabilitation therapy, such as:  ? Physical or occupational therapy. This may include exercises to help with balance and dizziness.  ? Mental health counseling.  ? Speech therapy.  ? Vision therapy. A  brain and eye specialist can recommend treatments for vision problems.  Follow these instructions at home:  Medicines  · Take over-the-counter and prescription medicines only as told by your health care provider.  · Avoid opioid prescription pain medicines when recovering from a concussion.  Activity  · Limit your mental activities for the first few days after your injury, such as:  ? Homework or job-related work.  ? Complex thinking.  ? Watching TV, and using a computer or phone.  ? Playing memory games and puzzles.  ? Gradually return to your normal activity level. If a certain activity brings on your symptoms, stop or slow down until you can do the activity without it triggering your symptoms.  · Limit physical activity, such as exercise or sports, for the first few days after a concussion. Gradually return to normal activity as told by your health care provider.  ? If a certain activity brings on your symptoms, stop or slow down until you can do the activity without it triggering your symptoms.  · Rest. Rest helps your brain heal. Make sure you:  ? Get plenty of sleep at night. Most adults should get at least 7-9 hours of sleep each night.  ? Rest during the day. Take naps or rest breaks when you feel tired.  · Do not do high-risk activities that could cause a second concussion,   by your health care provider. This is important. Contact a health care provider if:  Your symptoms do not improve.  You have another injury. Get help right away if you:  Have a severe or worsening headache.  Are confused.  Have trouble staying awake.  Pass out.   Vomit.  Have weakness or numbness in any part of your body.  Have a seizure.  Have trouble speaking. Summary  Post-concussion syndrome is when symptoms that occur after a concussion last longer than normal.  Symptoms usually go away on their own over time. Depending on your symptoms, you may need treatment, such as medicines or rehabilitation therapy.  Rest your brain and body for a few days after your injury. Gradually return to activities, as told by your health care provider.  Get plenty of sleep, and avoid alcohol and opioid pain medicines while recovering from a concussion. This information is not intended to replace advice given to you by your health care provider. Make sure you discuss any questions you have with your health care provider. Document Released: 05/26/2002 Document Revised: 09/26/2018 Document Reviewed: 01/08/2018 Elsevier Patient Education  2020 [employer] Neck Exercises Ask your health care provider which exercises are safe for you. Do exercises exactly as told by your health care provider and adjust them as directed. It is normal to feel mild stretching, pulling, tightness, or discomfort as you do these exercises. Stop right away if you feel sudden pain or your pain gets worse. Do not begin these exercises until told by your health care provider. Neck exercises can be important for many reasons. They can improve strength and maintain flexibility in your neck, which will help your upper back and prevent neck pain. Stretching exercises Rotation neck stretching  1. Sit in a chair or stand up. 2. Place your feet flat on the floor, shoulder width apart. 3. Slowly turn your head (rotate) to the right until a slight stretch is felt. Turn it all the way to the right so you can look over your right shoulder. Do not tilt or tip your head. 4. Hold this position for 10-30 seconds. 5. Slowly turn your head (rotate) to the left until a slight stretch is felt. Turn it all  the way to the left so you can look over your left shoulder. Do not tilt or tip your head. 6. Hold this position for 10-30 seconds. Repeat __________ times. Complete this exercise __________ times a day. Neck retraction 1. Sit in a sturdy chair or stand up. 2. Look straight ahead. Do not bend your neck. 3. Use your fingers to push your chin backward (retraction). Do not bend your neck for this movement. Continue to face straight ahead. If you are doing the exercise properly, you will feel a slight sensation in your throat and a stretch at the back of your neck. 4. Hold the stretch for 1-2 seconds. Repeat __________ times. Complete this exercise __________ times a day. Strengthening exercises Neck press 1. Lie on your back on a firm bed or on the floor with a pillow under your head. 2. Use your neck muscles to push your head down on the pillow and straighten your spine. 3. Hold the position as well as you can. Keep your head facing up (in a neutral position) and your chin tucked. 4. Slowly count to 5 while holding this position. Repeat __________ times. Complete this exercise __________ times a day. Isometrics These are exercises in which you strengthen the muscles in your neck while  keeping your neck still (isometrics). 1. Sit in a supportive chair and place your hand on your forehead. 2. Keep your head and face facing straight ahead. Do not flex or extend your neck while doing isometrics. 3. Push forward with your head and neck while pushing back with your hand. Hold for 10 seconds. 4. Do the sequence again, this time putting your hand against the back of your head. Use your head and neck to push backward against the hand pressure. 5. Finally, do the same exercise on either side of your head, pushing sideways against the pressure of your hand. Repeat __________ times. Complete this exercise __________ times a day. Prone head lifts 1. Lie face-down (prone position), resting on your elbows so  that your chest and upper back are raised. 2. Start with your head facing downward, near your chest. Position your chin either on or near your chest. 3. Slowly lift your head upward. Lift until you are looking straight ahead. Then continue lifting your head as far back as you can comfortably stretch. 4. Hold your head up for 5 seconds. Then slowly lower it to your starting position. Repeat __________ times. Complete this exercise __________ times a day. Supine head lifts 1. Lie on your back (supine position), bending your knees to point to the ceiling and keeping your feet flat on the floor. 2. Lift your head slowly off the floor, raising your chin toward your chest. 3. Hold for 5 seconds. Repeat __________ times. Complete this exercise __________ times a day. Scapular retraction 1. Stand with your arms at your sides. Look straight ahead. 2. Slowly pull both shoulders (scapulae) backward and downward (retraction) until you feel a stretch between your shoulder blades in your upper back. 3. Hold for 10-30 seconds. 4. Relax and repeat. Repeat __________ times. Complete this exercise __________ times a day. Contact a health care provider if:  Your neck pain or discomfort gets much worse when you do an exercise.  Your neck pain or discomfort does not improve within 2 hours after you exercise. If you have any of these problems, stop exercising right away. Do not do the exercises again unless your health care provider says that you can. Get help right away if:  You develop sudden, severe neck pain. If this happens, stop exercising right away. Do not do the exercises again unless your health care provider says that you can. This information is not intended to replace advice given to you by your health care provider. Make sure you discuss any questions you have with your health care provider. Document Released: 11/15/2015 Document Revised: 10/02/2018 Document Reviewed: 10/02/2018 Elsevier Patient  Education  2020 [employer].

## 2019-08-18 ENCOUNTER — Other Ambulatory Visit: Payer: Self-pay | Admitting: Psychiatry

## 2019-09-03 ENCOUNTER — Encounter: Payer: Self-pay | Admitting: Psychiatry

## 2019-09-03 ENCOUNTER — Other Ambulatory Visit: Payer: Self-pay

## 2019-09-03 ENCOUNTER — Ambulatory Visit (INDEPENDENT_AMBULATORY_CARE_PROVIDER_SITE_OTHER): Payer: PRIVATE HEALTH INSURANCE | Admitting: Psychiatry

## 2019-09-03 DIAGNOSIS — F5105 Insomnia due to other mental disorder: Secondary | ICD-10-CM

## 2019-09-03 DIAGNOSIS — F411 Generalized anxiety disorder: Secondary | ICD-10-CM | POA: Diagnosis not present

## 2019-09-03 DIAGNOSIS — F0781 Postconcussional syndrome: Secondary | ICD-10-CM

## 2019-09-03 DIAGNOSIS — F422 Mixed obsessional thoughts and acts: Secondary | ICD-10-CM

## 2019-09-03 DIAGNOSIS — F319 Bipolar disorder, unspecified: Secondary | ICD-10-CM

## 2019-09-03 DIAGNOSIS — F4001 Agoraphobia with panic disorder: Secondary | ICD-10-CM

## 2019-09-03 DIAGNOSIS — F515 Nightmare disorder: Secondary | ICD-10-CM

## 2019-09-03 MED ORDER — FLUVOXAMINE MALEATE 50 MG PO TABS: ORAL_TABLET | ORAL | 1 refills | Status: DC

## 2019-09-03 NOTE — Patient Instructions (Signed)
Getting Control by Lessie Dings PHD

## 2019-09-03 NOTE — Progress Notes (Signed)
Kristen Grayce SessionsM Kristen [name] 06/30/1971 48 y.o.   Subjective:   Patient ID:  Kristen JunglingChrissy Kristen [name] is a 48 y.o. (DOB 08/12/1971) female.  Chief Complaint:  Chief Complaint  Patient presents with  . Follow-up     Medication Management  . Anxiety     Medication Management    Anxiety Patient reports no confusion, decreased concentration, nervous/anxious behavior or suicidal ideas.    Depression        Associated symptoms include no decreased concentration and no suicidal ideas.  Past medical history includes anxiety.    Kristen Grayce SessionsM Burek presents  today for follow-up of bipolar and anxiety.    at visit May 19 and she tried increase Trileptal 900 mg HS daily.  Tolerated fine.  No SE.  Needs more time to evaluate effectiveness. EMA and Sunday only 4 hours sleep.  No other manic type sx but anxiety and worse last few days.  Stress socially is affecting them.  Just a few nightmares lately.  She had been having more.  visit  March 10 2019.  She could not t afford Latuda $800+ monthly and yet it had worked well.  She's failed multiple other meds.  We had to wean her off of Latuda knowing that could lead to relapse.    Last visit was May 22, 2019.  After stopping the Latuda her mood had remained fairly stable but she had an unexpected worsening of OCD.  She had to stop the Kristen [name] as noted because of cost.  The Trileptal dosage was increased in hopes of offsetting the discontinuation of Latuda.  She had side effects on normal doses of Vraylar but we discussed the option of using Vraylar 3 mg 3 days a week and using samples due to the cost of the medication.  She elected not to pursue that option.  Fine overall but had a bad fall with whiplash and concussion and post-concussion sx incl mild brief depression with crying.  Anxiety is up a little also.  Getting better.  OC is better with primarily obs thoughts about death if doesn't do things in the right order and in the same way even for ridiculous things  like the pattern of her driving.  Trying to fight it off and do things purposely out of the norm.  But often gives into it.  Can be time consuming.  Feels like 2 people inside over this.      Had lost a good bit of weight since being back on Latuda and Wellbutrin.  PE including labs are normal.   Past Psychiatric Medication Trials: Vraylar 3 restless, Latuda 80, oxcarbazine 900 SE,   risperidone nonresponse, lamotrigine 300, pramipexole, Seroquel weight gain, Abilify 10, lithium, duloxetine, Lexapro, Wellbutrin, fluoxetine, venlafaxine, sertraline, oxcarbazepine, clonazepam, alprazolam, buspar NR  Review of Systems:  No tremor, no weakness.  Medications: I have reviewed the patient's current medications.  Current Outpatient Medications  Medication Sig Dispense Refill  . ALPRAZolam (XANAX) 0.5 MG tablet TAKE ONE TABLET BY MOUTH 3 TIMES DAILY AS NEEDED FOR ANXIETY OR SLEEPAND 1 TABAT BEDTIME 120 tablet 3  . buPROPion (WELLBUTRIN XL) 150 MG 24 hr tablet TAKE 1 TABLET BY MOUTH EVERY MORNING (Patient taking differently: Take 150 mg by mouth daily. ) 30 tablet 5  . lamoTRIgine (LAMICTAL) 200 MG tablet TAKE ONE TABLET BY MOUTH EVERY DAY (Patient taking differently: Take 200 mg by mouth daily. ) 90 tablet 0  . levonorgestrel (MIRENA) 20 MCG/24HR IUD by Intrauterine route.    .Marland Kitchen  Oxcarbazepine (TRILEPTAL) 300 MG tablet Take 3 tablets (900 mg total) by mouth daily. (Patient taking differently: Take 300-600 mg by mouth daily. Take one tablet (300 mg) by mouth each morning and two tablets (600 mg) at bedtime) 270 tablet 0  . propranolol (INDERAL) 20 MG tablet TAKE 1 TO 2 TABLETS BY MOUTH 3 TIMES DAILY AS NEEDED FOR RESTLESSNESS 180 tablet 4  . XARELTO 10 MG TABS tablet TAKE 1 TABLET BY MOUTH DAILY 30 tablet 12  . fluvoxaMINE (LUVOX) 50 MG tablet 1 at night for 1 week, then 1 1/2 tablets nightly for 1 month, then 2 nightly 60 tablet 1   No current facility-administered medications for this visit.      Medication Side Effects: None  Allergies:  Allergies  Allergen Reactions  . Codeine Nausea Only    Can tolerate medication when taken with food    Past Medical History:  Diagnosis Date  . Anemia   . Anxiety   . Bipolar disorder (HCC)   . Cholelithiasis   . DVT (deep venous thrombosis) (HCC)   . GERD (gastroesophageal reflux disease)   . Kidney stones   . Pulmonary embolism (HCC)   . RLS (restless legs syndrome)     Family History  Problem Relation Age of Onset  . Diabetes Father   . Stroke Father   . Heart attack Father   . Kidney failure Father   . Hypertension Father   . Uterine cancer Maternal Grandmother   . Ulcers Maternal Grandmother   . Diabetes Maternal Grandfather   . Stroke Maternal Grandfather   . Breast cancer Paternal Grandmother   . Colon cancer Paternal Grandfather   . Hypertension Paternal Grandfather     Social History   Socioeconomic History  . Marital status: Married    Spouse name: Not on file  . Number of children: 3  . Years of education: Not on file  . Highest education level: Not on file  Occupational History  . Occupation: Software engineerteacher's assistant    Employer: [name]  Social Needs  . Financial resource strain: Not on file  . Food insecurity    Worry: Not on file    Inability: Not on file  . Transportation needs    Medical: Not on file    Non-medical: Not on file  Tobacco Use  . Smoking status: Never Smoker  . Smokeless tobacco: Never Used  Substance and Sexual Activity  . Alcohol use: No    Alcohol/week: 0.0 standard drinks  . Drug use: No  . Sexual activity: Yes    Partners: Male    Birth control/protection: I.U.D.    Comment: mirena  Lifestyle  . Physical activity    Days per week: Not on file    Minutes per session: Not on file  . Stress: Not on file  Relationships  . Social Musicianconnections    Talks on phone: Not on file    Gets together: Not on file    Attends religious service: Not on file     Active member of club or organization: Not on file    Attends meetings of clubs or organizations: Not on file    Relationship status: Not on file  . Intimate partner violence    Fear of current or ex partner: Not on file    Emotionally abused: Not on file    Physically abused: Not on file    Forced sexual activity: Not on file  Other Topics Concern  . Not  on file  Social History Narrative  . Not on file    Past Medical History, Surgical history, Social history, and Family history were reviewed and updated as appropriate.   Please see review of systems for further details on the patient's review from today.   Objective:   Physical Exam:  There were no vitals taken for this visit.  Physical Exam Neurological:     Mental Status: She is alert and oriented to person, place, and time.     Cranial Nerves: No dysarthria.  Psychiatric:        Attention and Perception: Attention normal. She is attentive. She does not perceive auditory hallucinations.        Mood and Affect: Mood is anxious and depressed.        Speech: Speech normal.        Behavior: Behavior is cooperative.        Thought Content: Thought content is not paranoid or delusional. Thought content does not include homicidal or suicidal ideation. Thought content does not include homicidal or suicidal plan.        Cognition and Memory: Cognition and memory normal.        Judgment: Judgment normal.     Comments: Much worse anxiety and obsessions off Latuda without significant changes since she was here     Lab Review:     Component Value Date/Time   NA 139 07/01/2018 1103   NA 140 08/15/2012 1624   K 3.8 07/01/2018 1103   K 3.6 08/15/2012 1624   CL 107 (H) 07/01/2018 1103   CL 106 08/15/2012 1624   CO2 21 07/01/2018 1103   CO2 26 08/15/2012 1624   GLUCOSE 88 07/01/2018 1103   GLUCOSE 153 (H) 03/05/2017 2142   GLUCOSE 83 08/15/2012 1624   BUN 8 07/01/2018 1103   BUN 10 08/15/2012 1624   CREATININE 0.56 (L)  07/01/2018 1103   CREATININE 0.55 (L) 08/15/2012 1624   CALCIUM 8.3 (L) 07/01/2018 1103   CALCIUM 9.0 08/15/2012 1624   PROT 5.6 (L) 07/01/2018 1103   PROT 6.5 08/15/2012 1624   ALBUMIN 3.4 (L) 07/01/2018 1103   ALBUMIN 3.4 08/15/2012 1624   AST 15 07/01/2018 1103   AST 22 08/15/2012 1624   ALT 12 07/01/2018 1103   ALT 23 08/15/2012 1624   ALKPHOS 69 07/01/2018 1103   ALKPHOS 98 08/15/2012 1624   BILITOT 0.5 07/01/2018 1103   BILITOT 0.8 08/15/2012 1624   GFRNONAA 112 07/01/2018 1103   GFRNONAA >60 08/15/2012 1624   GFRAA 129 07/01/2018 1103   GFRAA >60 08/15/2012 1624       Component Value Date/Time   WBC 7.5 10/08/2018 1603   WBC 6.9 12/21/2017 1505   RBC 4.68 10/08/2018 1603   RBC 4.76 12/21/2017 1505   HGB 14.2 10/08/2018 1603   HCT 43.4 10/08/2018 1603   PLT 371 10/08/2018 1603   MCV 93 10/08/2018 1603   MCV 91 08/15/2012 1624   MCH 30.3 10/08/2018 1603   MCH 31.1 12/21/2017 1505   MCHC 32.7 10/08/2018 1603   MCHC 33.4 12/21/2017 1505   RDW 12.9 10/08/2018 1603   RDW 15.7 (H) 08/15/2012 1624   LYMPHSABS 1.3 12/21/2017 1505   LYMPHSABS 1.5 10/08/2015 1107   LYMPHSABS 1.9 08/15/2012 1624   MONOABS 0.6 12/21/2017 1505   MONOABS 0.8 08/15/2012 1624   EOSABS 0.3 12/21/2017 1505   EOSABS 0.1 10/08/2015 1107   EOSABS 0.2 08/15/2012 1624   BASOSABS 0.1 12/21/2017 1505  BASOSABS 0.0 10/08/2015 1107   BASOSABS 0.0 08/15/2012 1624    No results found for: POCLITH, LITHIUM   No results found for: PHENYTOIN, PHENOBARB, VALPROATE, CBMZ   .res Assessment: Plan:    Bipolar I disorder ([place])  Mixed obsessional thoughts and acts  Generalized anxiety disorder  Panic disorder with agoraphobia  Insomnia due to mental condition  Nightmare  Post concussion syndrome   Greater than 50% of 30-minute face to face time with patient was spent on counseling and coordination of care. We discussed Good response and stability with psych meds.  However had to stop [place]  which is working.  This is very unfortunate.  Since stopping the [place] she is not had marked depression or mania but had unexpected recurrence of obsessive-compulsive symptoms.  She had these types of symptoms as a teenager but has not had severe symptoms as an adult and recent years.  The increase in Trileptal to 900 mg daily did not help the OCD.  She has not had mood swings however.  She is not markedly depressed.  Continue Trileptal to 900 mg dayly.    Informed her that we may have to go much higher and to particularly let us know if this insomnia persist.  Discussed side effects of each med in detail.  Start fluvaxamine 50 up to 100 mg nightly. Disc DDI with Xanax and use lower dosage.   We discussed the short-term risks associated with benzodiazepines including sedation and increased fall risk among others.  Discussed long-term side effect risk including dependence, potential withdrawal symptoms, and the potential eventual dose-related risk of dementia. Use LED especially in view of the effect of Luvox on the Xanax.  Getting Control by Lessie Dings PHD.  Recommend she read this book  FU 8 weeks  Lynder Parents, MD, DFAPA   Please see After Visit Summary for patient specific instructions.  No future appointments.  No orders of the defined types were placed in this encounter.     -------------------------------

## 2019-09-10 ENCOUNTER — Other Ambulatory Visit: Payer: Self-pay | Admitting: Psychiatry

## 2019-09-10 DIAGNOSIS — F319 Bipolar disorder, unspecified: Secondary | ICD-10-CM

## 2019-09-29 ENCOUNTER — Other Ambulatory Visit: Payer: Self-pay

## 2019-09-29 DIAGNOSIS — Z20822 Contact with and (suspected) exposure to covid-19: Secondary | ICD-10-CM

## 2019-09-30 LAB — NOVEL CORONAVIRUS, NAA: SARS-CoV-2, NAA: DETECTED — AB

## 2019-10-01 ENCOUNTER — Other Ambulatory Visit: Payer: Self-pay | Admitting: Psychiatry

## 2019-10-30 ENCOUNTER — Other Ambulatory Visit: Payer: Self-pay | Admitting: Psychiatry

## 2019-11-05 ENCOUNTER — Other Ambulatory Visit: Payer: Self-pay | Admitting: Psychiatry

## 2019-11-06 ENCOUNTER — Ambulatory Visit: Payer: PRIVATE HEALTH INSURANCE | Admitting: Psychiatry

## 2019-11-19 ENCOUNTER — Other Ambulatory Visit: Payer: Self-pay | Admitting: Psychiatry

## 2019-12-16 ENCOUNTER — Telehealth: Payer: Self-pay | Admitting: Psychiatry

## 2019-12-16 ENCOUNTER — Encounter: Payer: Self-pay | Admitting: Psychiatry

## 2019-12-16 ENCOUNTER — Ambulatory Visit (INDEPENDENT_AMBULATORY_CARE_PROVIDER_SITE_OTHER): Payer: PRIVATE HEALTH INSURANCE | Admitting: Psychiatry

## 2019-12-16 ENCOUNTER — Other Ambulatory Visit: Payer: Self-pay

## 2019-12-16 DIAGNOSIS — F4001 Agoraphobia with panic disorder: Secondary | ICD-10-CM

## 2019-12-16 DIAGNOSIS — F411 Generalized anxiety disorder: Secondary | ICD-10-CM | POA: Diagnosis not present

## 2019-12-16 DIAGNOSIS — F422 Mixed obsessional thoughts and acts: Secondary | ICD-10-CM | POA: Diagnosis not present

## 2019-12-16 DIAGNOSIS — F515 Nightmare disorder: Secondary | ICD-10-CM

## 2019-12-16 DIAGNOSIS — F0781 Postconcussional syndrome: Secondary | ICD-10-CM

## 2019-12-16 DIAGNOSIS — F5105 Insomnia due to other mental disorder: Secondary | ICD-10-CM

## 2019-12-16 DIAGNOSIS — F319 Bipolar disorder, unspecified: Secondary | ICD-10-CM | POA: Diagnosis not present

## 2019-12-16 MED ORDER — FLUVOXAMINE MALEATE 100 MG PO TABS: 200.0000 mg | ORAL_TABLET | Freq: Every day | ORAL | 0 refills | Status: DC

## 2019-12-16 NOTE — Patient Instructions (Signed)
1 and 1/2 tablets for 1-2 weeks then 2 nightly

## 2019-12-16 NOTE — Progress Notes (Signed)
Kristen [name] [id number] 1971/02/10 48 y.o.   Subjective:   Patient ID:  Kristen [name] is a 48 y.o. (DOB 1971/02/16) female.  Chief Complaint:  Chief Complaint  Patient presents with  . Follow-up    Mediation Management  . Anxiety    Mediation Management  . Other    Bipolar I disorder     Anxiety Patient reports no confusion, decreased concentration, nervous/anxious behavior or suicidal ideas.    Depression        Associated symptoms include no decreased concentration and no suicidal ideas.  Past medical history includes anxiety.    Kristen [name] presents  today for follow-up of bipolar and anxiety.    at visit May 19 and she tried increase Trileptal 900 mg HS daily.  Tolerated fine.  No SE.  Needs more time to evaluate effectiveness. EMA and Sunday only 4 hours sleep.  No other manic type sx but anxiety and worse last few days.  Stress socially is affecting them.  Just a few nightmares lately.  She had been having more.  visit  March 10 2019.  She could not t afford Latuda $800+ monthly and yet it had worked well.  She's failed multiple other meds.  We had to wean her off of Latuda knowing that could lead to relapse.    visit May 22, 2019.  After stopping the Latuda her mood had remained fairly stable but she had an unexpected worsening of OCD.  She had to stop the [place] as noted because of cost.  The Trileptal dosage was increased in hopes of offsetting the discontinuation of Latuda.  She had side effects on normal doses of Vraylar but we discussed the option of using Vraylar 3 mg 3 days a week and using samples due to the cost of the medication.  She elected not to pursue that option.  Last seen September 2020.  Her mood and depression had remained under control however she had a relapse of OCD which had been a problem in younger years.  She was started on fluvoxamine low-dose of to 100 mg daily for that  Some better with OCD and fluvoxamine.  Tolerated the obsessions  with less over-powering.  No SE.  No sedation.  No sign mood swings.    She and H and 2 kids had Covid.  Not that bad for them.  Fine overall but had a bad fall with whiplash and concussion and post-concussion sx incl mild brief depression with crying. HA and dizziness still a problem and could last 6-12 mos.  Anxiety is up a little also.  Getting better.  OC is better with primarily obs thoughts about death if doesn't do things in the right order and in the same way even for ridiculous things like the pattern of her driving.  Trying to fight it off and do things purposely out of the norm.  But often gives into it.  Can be time consuming.  Feels like 2 people inside over this.    Agree to increase fluvoxamine for better control.   Past Psychiatric Medication Trials: Vraylar 3 restless, Latuda 80, oxcarbazine 900 SE,   risperidone nonresponse, lamotrigine 300, pramipexole, Seroquel weight gain, Abilify 10, lithium, duloxetine, Lexapro, Wellbutrin, fluoxetine, venlafaxine, sertraline, oxcarbazepine, clonazepam, alprazolam, buspar NR  Review of Systems:  No tremor, no weakness.  Dizziness.  Medications: I have reviewed the patient's current medications.  Current Outpatient Medications  Medication Sig Dispense Refill  . ALPRAZolam (XANAX) 0.5 MG tablet TAKE  ONE TABLET BY MOUTH 3 TIMES DAILY AS NEEDED FOR ANXIETY OR SLEEPAND 1 TABAT BEDTIME 120 tablet 3  . buPROPion (WELLBUTRIN XL) 150 MG 24 hr tablet TAKE ONE TABLET (150 MG) BY MOUTH EVERY MORNING 30 tablet 5  . lamoTRIgine (LAMICTAL) 200 MG tablet TAKE ONE TABLET EVERY DAY 90 tablet 0  . levonorgestrel (MIRENA) 20 MCG/24HR IUD by Intrauterine route.    . Oxcarbazepine (TRILEPTAL) 300 MG tablet Take one tablet (300 mg) by mouth each morning and two tablets (600 mg) at bedtime 270 tablet 1  . propranolol (INDERAL) 20 MG tablet TAKE 1 TO 2 TABLET 3 TIMES DAILY AS NEEDED FOR RESTLESSNESS 180 tablet 0  . XARELTO 10 MG TABS tablet TAKE 1 TABLET BY  MOUTH DAILY 30 tablet 12  . fluvoxaMINE (LUVOX) 100 MG tablet Take 2 tablets (200 mg total) by mouth at bedtime. Take 1 tablet (100 mg)  every evening 180 tablet 0   No current facility-administered medications for this visit.    Medication Side Effects: None  Allergies:  Allergies  Allergen Reactions  . Codeine Nausea Only    Can tolerate medication when taken with food    Past Medical History:  Diagnosis Date  . Anemia   . Anxiety   . Bipolar disorder (HCC)   . Cholelithiasis   . DVT (deep venous thrombosis) (HCC)   . GERD (gastroesophageal reflux disease)   . Kidney stones   . Pulmonary embolism (HCC)   . RLS (restless legs syndrome)     Family History  Problem Relation Age of Onset  . Diabetes Father   . Stroke Father   . Heart attack Father   . Kidney failure Father   . Hypertension Father   . Uterine cancer Maternal Grandmother   . Ulcers Maternal Grandmother   . Diabetes Maternal Grandfather   . Stroke Maternal Grandfather   . Breast cancer Paternal Grandmother   . Colon cancer Paternal Grandfather   . Hypertension Paternal Grandfather     Social History   Socioeconomic History  . Marital status: Married    Spouse name: Not on file  . Number of children: 3  . Years of education: Not on file  . Highest education level: Not on file  Occupational History  . Occupation: Software engineer: [employer]  Tobacco Use  . Smoking status: Never Smoker  . Smokeless tobacco: Never Used  Substance and Sexual Activity  . Alcohol use: No    Alcohol/week: 0.0 standard drinks  . Drug use: No  . Sexual activity: Yes    Partners: Male    Birth control/protection: I.U.D.    Comment: mirena  Other Topics Concern  . Not on file  Social History Narrative  . Not on file   Social Determinants of Health   Financial Resource Strain:   . Difficulty of Paying Living Expenses: Not on file  Food Insecurity:   . Worried About Patent examiner in the Last Year: Not on file  . Ran Out of Food in the Last Year: Not on file  Transportation Needs:   . Lack of Transportation (Medical): Not on file  . Lack of Transportation (Non-Medical): Not on file  Physical Activity:   . Days of Exercise per Week: Not on file  . Minutes of Exercise per Session: Not on file  Stress:   . Feeling of Stress : Not on file  Social Connections:   . Frequency of Communication with  Friends and Family: Not on file  . Frequency of Social Gatherings with Friends and Family: Not on file  . Attends Religious Services: Not on file  . Active Member of Clubs or Organizations: Not on file  . Attends Banker Meetings: Not on file  . Marital Status: Not on file  Intimate Partner Violence:   . Fear of Current or Ex-Partner: Not on file  . Emotionally Abused: Not on file  . Physically Abused: Not on file  . Sexually Abused: Not on file    Past Medical History, Surgical history, Social history, and Family history were reviewed and updated as appropriate.   Please see review of systems for further details on the patient's review from today.   Objective:   Physical Exam:  There were no vitals taken for this visit.  Physical Exam Constitutional:      General: She is not in acute distress.    Appearance: She is well-developed.  Musculoskeletal:        General: No deformity.  Neurological:     Mental Status: She is alert and oriented to person, place, and time.     Cranial Nerves: No dysarthria.     Coordination: Coordination normal.  Psychiatric:        Attention and Perception: Attention and perception normal. She is attentive. She does not perceive auditory or visual hallucinations.        Mood and Affect: Mood is anxious. Mood is not depressed. Affect is not labile, blunt, angry or inappropriate.        Speech: Speech normal.        Behavior: Behavior normal. Behavior is cooperative.        Thought Content: Thought content normal.  Thought content is not paranoid or delusional. Thought content does not include homicidal or suicidal ideation. Thought content does not include homicidal or suicidal plan.        Cognition and Memory: Cognition and memory normal.        Judgment: Judgment normal.     Comments: worse anxiety and obsessions off Latuda without significant changes since she was here. Not really depressed but stressed with adult kids.     Lab Review:     Component Value Date/Time   NA 139 07/01/2018 1103   NA 140 08/15/2012 1624   K 3.8 07/01/2018 1103   K 3.6 08/15/2012 1624   CL 107 (H) 07/01/2018 1103   CL 106 08/15/2012 1624   CO2 21 07/01/2018 1103   CO2 26 08/15/2012 1624   GLUCOSE 88 07/01/2018 1103   GLUCOSE 153 (H) 03/05/2017 2142   GLUCOSE 83 08/15/2012 1624   BUN 8 07/01/2018 1103   BUN 10 08/15/2012 1624   CREATININE 0.56 (L) 07/01/2018 1103   CREATININE 0.55 (L) 08/15/2012 1624   CALCIUM 8.3 (L) 07/01/2018 1103   CALCIUM 9.0 08/15/2012 1624   PROT 5.6 (L) 07/01/2018 1103   PROT 6.5 08/15/2012 1624   ALBUMIN 3.4 (L) 07/01/2018 1103   ALBUMIN 3.4 08/15/2012 1624   AST 15 07/01/2018 1103   AST 22 08/15/2012 1624   ALT 12 07/01/2018 1103   ALT 23 08/15/2012 1624   ALKPHOS 69 07/01/2018 1103   ALKPHOS 98 08/15/2012 1624   BILITOT 0.5 07/01/2018 1103   BILITOT 0.8 08/15/2012 1624   GFRNONAA 112 07/01/2018 1103   GFRNONAA >60 08/15/2012 1624   GFRAA 129 07/01/2018 1103   GFRAA >60 08/15/2012 1624       Component Value Date/Time  WBC 7.5 10/08/2018 1603   WBC 6.9 12/21/2017 1505   RBC 4.68 10/08/2018 1603   RBC 4.76 12/21/2017 1505   HGB 14.2 10/08/2018 1603   HCT 43.4 10/08/2018 1603   PLT 371 10/08/2018 1603   MCV 93 10/08/2018 1603   MCV 91 08/15/2012 1624   MCH 30.3 10/08/2018 1603   MCH 31.1 12/21/2017 1505   MCHC 32.7 10/08/2018 1603   MCHC 33.4 12/21/2017 1505   RDW 12.9 10/08/2018 1603   RDW 15.7 (H) 08/15/2012 1624   LYMPHSABS 1.3 12/21/2017 1505   LYMPHSABS  1.5 10/08/2015 1107   LYMPHSABS 1.9 08/15/2012 1624   MONOABS 0.6 12/21/2017 1505   MONOABS 0.8 08/15/2012 1624   EOSABS 0.3 12/21/2017 1505   EOSABS 0.1 10/08/2015 1107   EOSABS 0.2 08/15/2012 1624   BASOSABS 0.1 12/21/2017 1505   BASOSABS 0.0 10/08/2015 1107   BASOSABS 0.0 08/15/2012 1624    No results found for: POCLITH, LITHIUM   No results found for: PHENYTOIN, PHENOBARB, VALPROATE, CBMZ   .res Assessment: Plan:    Bipolar I disorder (HCC)  Mixed obsessional thoughts and acts - Plan: fluvoxaMINE (LUVOX) 100 MG tablet  Generalized anxiety disorder  Panic disorder with agoraphobia  Insomnia due to mental condition  Nightmare  Post concussion syndrome   Greater than 50% of 30-minute face to face time with patient was spent on counseling and coordination of care. We discussed Good response and stability with psych meds.  However had to stop JordanLatuda which is working.  This is very unfortunate.  Historically she has relapse with symptoms whenever she has stopped JordanLatuda.  Since stopping the JordanLatuda she is not had marked depression or mania but had unexpected recurrence of obsessive-compulsive symptoms.  She had these types of symptoms as a teenager but has not had severe symptoms as an adult and recent years.  The increase in Trileptal to 900 mg daily did not help the OCD.  She has not had mood swings however.  She is not markedly depressed.  Continue Trileptal to 900 mg dayly.    Informed her that we may have to go much higher and to particularly let us know if this insomnia persist.  Discussed side effects of each med in detail.  She has had limited but some positive effect out of fluvoxamine 100 mg daily and has tolerated it well.  Discussed the risk of it triggering mood swings potentially and bipolar patients and to let us know if that occurs.  However she would like additional improvement if possible, therefore increase  fluvaxamine 150 up to 200 mg nightly. Disc DDI with  Xanax and use lower dosage.   We discussed the short-term risks associated with benzodiazepines including sedation and increased fall risk among others.  Discussed long-term side effect risk including dependence, potential withdrawal symptoms, and the potential eventual dose-related risk of dementia. Use LED especially in view of the effect of Luvox on the Xanax.  Getting Control by Fabian SharpLee Baer PHD.  Recommend she read this book  FU 8 weeks  Meredith Staggersarey Cottle, MD, DFAPA   Please see After Visit Summary for patient specific instructions.  No future appointments.  No orders of the defined types were placed in this encounter.     -------------------------------

## 2019-12-16 NOTE — Telephone Encounter (Signed)
Kristen [name] from [facility] is requesting clarification on the Fluvoxamine directions .

## 2019-12-18 ENCOUNTER — Other Ambulatory Visit: Payer: Self-pay

## 2019-12-18 DIAGNOSIS — F422 Mixed obsessional thoughts and acts: Secondary | ICD-10-CM

## 2019-12-18 MED ORDER — FLUVOXAMINE MALEATE 100 MG PO TABS: ORAL_TABLET | ORAL | 0 refills | Status: DC

## 2019-12-18 NOTE — Telephone Encounter (Signed)
Please call the pharmacist and clarify the instructions as 1 at 6 PM and 2 at bedtime of the fluvoxamine tablets

## 2020-01-02 ENCOUNTER — Other Ambulatory Visit: Payer: Self-pay | Admitting: Psychiatry

## 2020-01-20 ENCOUNTER — Encounter: Payer: Self-pay | Admitting: Family Medicine

## 2020-01-20 ENCOUNTER — Other Ambulatory Visit: Payer: Self-pay

## 2020-01-20 ENCOUNTER — Ambulatory Visit (INDEPENDENT_AMBULATORY_CARE_PROVIDER_SITE_OTHER): Payer: 59 | Admitting: Family Medicine

## 2020-01-20 DIAGNOSIS — G44039 Episodic paroxysmal hemicrania, not intractable: Secondary | ICD-10-CM | POA: Diagnosis not present

## 2020-01-20 MED ORDER — GABAPENTIN 300 MG PO CAPS: 300.0000 mg | ORAL_CAPSULE | Freq: Every day | ORAL | 1 refills | Status: DC

## 2020-01-20 NOTE — Progress Notes (Signed)
Patient: Kristen [name] Female    DOB: 1970/12/31   49 y.o.   MRN: [id number] Visit Date: 01/20/2020  Today's Provider: Lelon Huh, MD   Chief Complaint  Patient presents with  . Headache    x 7 month    Subjective:    Virtual Visit via Video Note  I connected with Kristen [name] on 01/20/20 at 10:40 AM EST by a video enabled telemedicine application and verified that I am speaking with the correct person using two identifiers.  Location: Patient: work Provider: [facility]   I discussed the limitations of evaluation and management by telemedicine and the availability of in person appointments. The patient expressed understanding and agreed to proceed.    Headache  This is a recurrent problem. Episode onset: 7 months ago after falling and sustaing a concussion. The problem has been gradually worsening. The pain quality is similar to prior headaches. Associated symptoms include tinnitus. Pertinent negatives include no abdominal pain, dizziness, fever, nausea, vomiting or weakness. Treatments tried: Advil, Aleve, Tylenol. The treatment provided mild relief. Her past medical history is significant for recent head traumas (7 months ago).   Patient reports her headaches come on suddenly and last anywhere between 1 hours to several days. Interfering with sleep. Rapid onset. No precipitating factors. Headaches frequently on the left parietal area, but often global squeezing headaches.  She did take gabapentin for several weeks after concussion in July. Headaches went away for a while.   Headaches sometimes wake her up in the night.   No numbness or tingling.    No history of migraines.   Ringing in left ear off and on for the last several days. No ear pain.     Allergies  Allergen Reactions  . Codeine Nausea Only    Can tolerate medication when taken with food     Current Outpatient Medications:  .  ALPRAZolam (XANAX) 0.5 MG tablet, TAKE ONE TABLET BY MOUTH 3 TIMES  DAILY AS NEEDED FOR ANXIETY OR SLEEPAND 1 TABAT BEDTIME, Disp: 120 tablet, Rfl: 3 .  buPROPion (WELLBUTRIN XL) 150 MG 24 hr tablet, TAKE ONE TABLET (150 MG) BY MOUTH EVERY MORNING, Disp: 30 tablet, Rfl: 5 .  fluvoxaMINE (LUVOX) 100 MG tablet, Take 1 tablet (100 mg) at 6 pm and 2 tablets (200 mg) at bedtime, Disp: 270 tablet, Rfl: 0 .  lamoTRIgine (LAMICTAL) 200 MG tablet, TAKE ONE TABLET EVERY DAY, Disp: 90 tablet, Rfl: 0 .  levonorgestrel (MIRENA) 20 MCG/24HR IUD, by Intrauterine route., Disp: , Rfl:  .  Oxcarbazepine (TRILEPTAL) 300 MG tablet, Take one tablet (300 mg) by mouth each morning and two tablets (600 mg) at bedtime, Disp: 270 tablet, Rfl: 1 .  propranolol (INDERAL) 20 MG tablet, TAKE 1 TO 2 TABLET 3 TIMES DAILY AS NEEDED FOR RESTLESSNESS, Disp: 180 tablet, Rfl: 0 .  XARELTO 10 MG TABS tablet, TAKE 1 TABLET BY MOUTH DAILY, Disp: 30 tablet, Rfl: 12  Review of Systems  Constitutional: Negative for appetite change, chills, fatigue and fever.  HENT: Positive for tinnitus.   Eyes: Positive for visual disturbance (blurred vision).  Respiratory: Negative for chest tightness and shortness of breath.   Cardiovascular: Negative for chest pain and palpitations.  Gastrointestinal: Negative for abdominal pain, nausea and vomiting.  Neurological: Positive for headaches. Negative for dizziness and weakness.  Psychiatric/Behavioral: Positive for sleep disturbance (due to headaches).    Social History   Tobacco Use  . Smoking status: Never Smoker  .  Smokeless tobacco: Never Used  Substance Use Topics  . Alcohol use: No    Alcohol/week: 0.0 standard drinks      Objective:   There were no vitals taken for this visit. There were no vitals filed for this visit.There is no height or weight on file to calculate BMI.   Physical Exam  Awake, alert, oriented x 3. In no apparent distress  No results found for any visits on 01/20/20.     Assessment & Plan    1. Episodic paroxysmal  hemicrania, not intractable Having some red flags present, now nearly 7 months after initial concussion injury. Needs further neurological imaging and start back on - gabapentin (NEURONTIN) 300 MG capsule; Take 1 capsule (300 mg total) by mouth at bedtime.  Dispense: 90 capsule; Refill: 1 - MR Brain W Wo Contrast; Future - MR Angiogram Head Wo Contrast; Future   Follow Up Instructions:    I discussed the assessment and treatment plan with the patient. The patient was provided an opportunity to ask questions and all were answered. The patient agreed with the plan and demonstrated an understanding of the instructions.   The patient was advised to call back or seek an in-person evaluation if the symptoms worsen or if the condition fails to improve as anticipated.  I provided 11 minutes of non-face-to-face time during this encounter.      Mila Merry, MD  [facility]

## 2020-01-21 ENCOUNTER — Telehealth: Payer: Self-pay

## 2020-01-21 NOTE — Telephone Encounter (Signed)
Copied from CRM [id number]. Topic: General - Other >> Jan 21, 2020  4:24 PM Maye Hides wrote: Reason for CRM: Per [employer] they will not authorized both an MRA & MRI of brain. I am trying to get approval for  MRA. Let me know if we need to change anything,Thanks

## 2020-01-30 ENCOUNTER — Encounter: Payer: Self-pay | Admitting: Family Medicine

## 2020-02-12 ENCOUNTER — Ambulatory Visit: Payer: PRIVATE HEALTH INSURANCE | Admitting: Psychiatry

## 2020-02-13 NOTE — Telephone Encounter (Signed)
Both test have been authorized but can not schedule until pt can provide information of implanted device to [facility]

## 2020-02-20 ENCOUNTER — Ambulatory Visit
Admission: RE | Admit: 2020-02-20 | Discharge: 2020-02-20 | Disposition: A | Discharge status: Home / Self Care | Payer: 59 | Source: Ambulatory Visit | Attending: Family Medicine | Admitting: Family Medicine

## 2020-02-20 ENCOUNTER — Other Ambulatory Visit: Payer: Self-pay

## 2020-02-20 DIAGNOSIS — G44039 Episodic paroxysmal hemicrania, not intractable: Secondary | ICD-10-CM | POA: Insufficient documentation

## 2020-02-20 MED ORDER — GADOBUTROL 1 MMOL/ML IV SOLN
7.0000 mL | Freq: Once | INTRAVENOUS | Status: AC | PRN
  Administered 2020-02-20: 7 mL via INTRAVENOUS

## 2020-02-24 ENCOUNTER — Other Ambulatory Visit: Payer: Self-pay

## 2020-02-24 MED ORDER — TOPIRAMATE 50 MG PO TABS: ORAL_TABLET | ORAL | 1 refills | Status: DC

## 2020-02-24 NOTE — Telephone Encounter (Signed)
LM to retuen call. Okay for PEC to advise patient. Medication is pending per Dr. Sherrie Mustache.

## 2020-02-24 NOTE — Telephone Encounter (Signed)
RX sent to [facility]

## 2020-02-24 NOTE — Telephone Encounter (Signed)
Ok, recommend changed from gabapentin to topiramate for migraines, might help with tinnitus as well. Let me know if not better in 3-4 weeks.

## 2020-02-24 NOTE — Telephone Encounter (Signed)
Patient advised. She states the headaches are unchanged and the tinnitus is worse.

## 2020-02-24 NOTE — Telephone Encounter (Signed)
Requested medication (s) are due for refill today: yes  Requested medication (s) are on the active medication list: yes  Last refill:  ?  Future visit scheduled: no  Notes to clinic:  not delegated   Requested Prescriptions  Pending Prescriptions Disp Refills   topiramate (TOPAMAX) 50 MG tablet 60 tablet 1    Sig: 1/2 tablet every morning for 7 days, then 1/2 tablet twice a day for 7 days, than 1 every morning and 1/2 at night for 7 days, then 1 tablet twice a day      Not Delegated - Neurology: Anticonvulsants - topiramate & zonisamide Failed - 02/24/2020 11:48 AM      Failed - This refill cannot be delegated      Failed - Cr in normal range and within 360 days    Creatinine  Date Value Ref Range Status  08/15/2012 0.55 (L) 0.60 - 1.30 mg/dL Final   Creatinine, Ser  Date Value Ref Range Status  07/01/2018 0.56 (L) 0.57 - 1.00 mg/dL Final          Failed - CO2 in normal range and within 360 days    CO2  Date Value Ref Range Status  07/01/2018 21 20 - 29 mmol/L Final   Co2  Date Value Ref Range Status  08/15/2012 26 21 - 32 mmol/L Final          Passed - Valid encounter within last 12 months    Recent Outpatient Visits           1 month ago Episodic paroxysmal hemicrania, not intractable   [facility] Malva Limes, MD   6 months ago Postconcussive syndrome   [facility], [place], [place]   7 months ago Photosensitivity   [facility] Malva Limes, MD   10 months ago Persistent headaches   [facility] Malva Limes, MD   11 months ago Tension headache   [facility] Sherrie Mustache, Demetrios Isaacs, MD

## 2020-02-24 NOTE — Telephone Encounter (Signed)
-----   Message from Malva Limes, MD sent at 02/24/2020 11:28 AM EST ----- MRI is normal. Please see if headaches are any better since starting gabapentin. We can adjust dose if it well tolerated but not strong enough.

## 2020-02-24 NOTE — Telephone Encounter (Signed)
Pt. Called back, given message from Dr. Sherrie Mustache to stop gabapentin and start topiramate. Please send medication prescription to [facility].

## 2020-02-27 ENCOUNTER — Other Ambulatory Visit: Payer: Self-pay | Admitting: Psychiatry

## 2020-02-27 NOTE — Telephone Encounter (Signed)
Next apt 03/30, any refills?

## 2020-03-16 ENCOUNTER — Ambulatory Visit (INDEPENDENT_AMBULATORY_CARE_PROVIDER_SITE_OTHER): Payer: 59 | Admitting: Psychiatry

## 2020-03-16 ENCOUNTER — Encounter: Payer: Self-pay | Admitting: Psychiatry

## 2020-03-16 ENCOUNTER — Other Ambulatory Visit: Payer: Self-pay

## 2020-03-16 DIAGNOSIS — F411 Generalized anxiety disorder: Secondary | ICD-10-CM

## 2020-03-16 DIAGNOSIS — F5105 Insomnia due to other mental disorder: Secondary | ICD-10-CM

## 2020-03-16 DIAGNOSIS — F4001 Agoraphobia with panic disorder: Secondary | ICD-10-CM

## 2020-03-16 DIAGNOSIS — F319 Bipolar disorder, unspecified: Secondary | ICD-10-CM | POA: Diagnosis not present

## 2020-03-16 DIAGNOSIS — F515 Nightmare disorder: Secondary | ICD-10-CM

## 2020-03-16 DIAGNOSIS — F422 Mixed obsessional thoughts and acts: Secondary | ICD-10-CM | POA: Diagnosis not present

## 2020-03-16 DIAGNOSIS — F0781 Postconcussional syndrome: Secondary | ICD-10-CM

## 2020-03-16 NOTE — Progress Notes (Signed)
Kristen [name] Kristen [name] [id number] 03-29-1971 49 y.o.   Subjective:   Patient ID:  Kristen [name] is a 49 y.o. (DOB 06-Apr-1971) female.  Chief Complaint:  Chief Complaint  Patient presents with  . Follow-up    mood and anxiety  . Anxiety  . complications from covid and fall    post concussion    Anxiety Patient reports no confusion, decreased concentration, nervous/anxious behavior or suicidal ideas.    Depression        Associated symptoms include no decreased concentration and no suicidal ideas.  Past medical history includes anxiety.    Kristen [name] presents  today for follow-up of bipolar and anxiety.    at visit May 19 and she tried increase Trileptal 900 mg HS daily.  Tolerated fine.  No SE.  Needs more time to evaluate effectiveness. EMA and Sunday only 4 hours sleep.  No other manic type sx but anxiety and worse last few days.  Stress socially is affecting them.  Just a few nightmares lately.  She had been having more.  visit  March 10 2019.  She could not t afford Latuda $800+ monthly and yet it had worked well.  She's failed multiple other meds.  We had to wean her off of Latuda knowing that could lead to relapse.    visit May 22, 2019.  After stopping the Latuda her mood had remained fairly stable but she had an unexpected worsening of OCD.  She had to stop the [name] as noted because of cost.  The Trileptal dosage was increased in hopes of offsetting the discontinuation of Latuda.  She had side effects on normal doses of Vraylar but we discussed the option of using Vraylar 3 mg 3 days a week and using samples due to the cost of the medication.  She elected not to pursue that option.  seen September 2020.  Her mood and depression had remained under control however she had a relapse of OCD which had been a problem in younger years.  She was started on fluvoxamine low-dose of to 100 mg daily for that   Last seen December 2020.  The following was noted: She and H and 2 kids had  Covid.  Not that bad for themSome better with OCD and fluvoxamine.  Tolerated the obsessions with less over-powering.  No SE.  No sedation.  No sign mood swings.   It was suggested she increase fluvoxamine to 300 mg nightly to try to further help with OCD. She was content to continue Trileptal 900 mg daily.  As of 3/30/221 :  Overall OK.  Increase fluvoxamine helped OCD and anxiety. Patient reports stable mood and denies depressed or irritable moods.  Patient denies difficulty with sleep initiation or maintenance. Denies appetite disturbance.  Patient reports that energy and motivation have been good.  Patient denies any difficulty with concentration.  Patient denies any suicidal ideation.  bad fall with whiplash and concussion and post-concussion sx incl mild brief depression with crying. HA and dizziness still a problem and could last 6-12 mos. Tinnitus is worse and constant since Christmas.  OC is better with primarily obs thoughts about death if doesn't do things in the right order and in the same way even for ridiculous things like the pattern of her driving.  Trying to fight it off and do things purposely out of the norm.  But often gives into it.  Can be time consuming.  Feels like 2 people inside over this.  Past Psychiatric Medication Trials: Vraylar 3 restless, Latuda 80, oxcarbazine 900 SE,   risperidone nonresponse, lamotrigine 300, Seroquel weight gain, Abilify 10, lithium, oxcarbazepine, pramipexole,  duloxetine, Lexapro, Wellbutrin, fluoxetine, venlafaxine, sertraline,   clonazepam, alprazolam, buspar NR  Review of Systems:  No tremor, no weakness.  Dizziness.  Medications: I have reviewed the patient's current medications.  Current Outpatient Medications  Medication Sig Dispense Refill  . ALPRAZolam (XANAX) 0.5 MG tablet TAKE 1 TABLET BY MOUTH 3 TIMES DAILY AS NEEDED FOR ANXIETY OR SLEEP AND 1 TABLETAT BEDTIME 120 tablet 0  . buPROPion (WELLBUTRIN XL) 150 MG 24 hr tablet TAKE  ONE TABLET (150 MG) BY MOUTH EVERY MORNING 30 tablet 5  . fluvoxaMINE (LUVOX) 100 MG tablet Take 1 tablet (100 mg) at 6 pm and 2 tablets (200 mg) at bedtime 270 tablet 0  . lamoTRIgine (LAMICTAL) 200 MG tablet TAKE ONE TABLET EVERY DAY 90 tablet 0  . levonorgestrel (MIRENA) 20 MCG/24HR IUD by Intrauterine route.    . Oxcarbazepine (TRILEPTAL) 300 MG tablet Take one tablet (300 mg) by mouth each morning and two tablets (600 mg) at bedtime 270 tablet 1  . propranolol (INDERAL) 20 MG tablet TAKE 1 TO 2 TABLETS 3 TIMES DAILY AS NEEDED FOR RESTLESSNESS 180 tablet 0  . topiramate (TOPAMAX) 50 MG tablet 1/2 tablet every morning for 7 days, then 1/2 tablet twice a day for 7 days, than 1 every morning and 1/2 at night for 7 days, then 1 tablet twice a day (Patient taking differently: Take 50 mg by mouth 2 (two) times daily. ) 60 tablet 1  . XARELTO 10 MG TABS tablet TAKE 1 TABLET BY MOUTH DAILY 30 tablet 12   No current facility-administered medications for this visit.    Medication Side Effects: None  Allergies:  Allergies  Allergen Reactions  . Codeine Nausea Only    Can tolerate medication when taken with food    Past Medical History:  Diagnosis Date  . Anemia   . Anxiety   . Bipolar disorder (HCC)   . Cholelithiasis   . DVT (deep venous thrombosis) (HCC)   . GERD (gastroesophageal reflux disease)   . Kidney stones   . Pulmonary embolism (HCC)   . RLS (restless legs syndrome)     Family History  Problem Relation Age of Onset  . Diabetes Father   . Stroke Father   . Heart attack Father   . Kidney failure Father   . Hypertension Father   . Uterine cancer Maternal Grandmother   . Ulcers Maternal Grandmother   . Diabetes Maternal Grandfather   . Stroke Maternal Grandfather   . Breast cancer Paternal Grandmother   . Colon cancer Paternal Grandfather   . Hypertension Paternal Grandfather     Social History   Socioeconomic History  . Marital status: Married    Spouse name:  Not on file  . Number of children: 3  . Years of education: Not on file  . Highest education level: Not on file  Occupational History  . Occupation: Software engineer: [employer]  Tobacco Use  . Smoking status: Never Smoker  . Smokeless tobacco: Never Used  Substance and Sexual Activity  . Alcohol use: No    Alcohol/week: 0.0 standard drinks  . Drug use: No  . Sexual activity: Yes    Partners: Male    Birth control/protection: I.U.D.    Comment: mirena  Other Topics Concern  . Not on file  Social History Narrative  . Not on file   Social Determinants of Health   Financial Resource Strain:   . Difficulty of Paying Living Expenses:   Food Insecurity:   . Worried About Programme researcher, broadcasting/film/videounning Out of Food in the Last Year:   . Baristaan Out of Food in the Last Year:   Transportation Needs:   . Freight forwarderLack of Transportation (Medical):   Marland Kitchen. Lack of Transportation (Non-Medical):   Physical Activity:   . Days of Exercise per Week:   . Minutes of Exercise per Session:   Stress:   . Feeling of Stress :   Social Connections:   . Frequency of Communication with Friends and Family:   . Frequency of Social Gatherings with Friends and Family:   . Attends Religious Services:   . Active Member of Clubs or Organizations:   . Attends BankerClub or Organization Meetings:   Marland Kitchen. Marital Status:   Intimate Partner Violence:   . Fear of Current or Ex-Partner:   . Emotionally Abused:   Marland Kitchen. Physically Abused:   . Sexually Abused:     Past Medical History, Surgical history, Social history, and Family history were reviewed and updated as appropriate.   Please see review of systems for further details on the patient's review from today.   Objective:   Physical Exam:  There were no vitals taken for this visit.  Physical Exam Constitutional:      General: She is not in acute distress.    Appearance: She is well-developed.  Musculoskeletal:        General: No deformity.  Neurological:      Mental Status: She is alert and oriented to person, place, and time.     Cranial Nerves: No dysarthria.     Coordination: Coordination normal.  Psychiatric:        Attention and Perception: Attention and perception normal. She is attentive. She does not perceive auditory or visual hallucinations.        Mood and Affect: Mood is anxious. Mood is not depressed. Affect is not labile, blunt, angry or inappropriate.        Speech: Speech normal.        Behavior: Behavior normal. Behavior is cooperative.        Thought Content: Thought content normal. Thought content is not paranoid or delusional. Thought content does not include homicidal or suicidal ideation. Thought content does not include homicidal or suicidal plan.        Cognition and Memory: Cognition and memory normal.        Judgment: Judgment normal.     Comments: Anxiety and obsessions are improved again with the increase in fluvoxamine. Not really depressed but stressed with adult kids.     Lab Review:     Component Value Date/Time   NA 139 07/01/2018 1103   NA 140 08/15/2012 1624   K 3.8 07/01/2018 1103   K 3.6 08/15/2012 1624   CL 107 (H) 07/01/2018 1103   CL 106 08/15/2012 1624   CO2 21 07/01/2018 1103   CO2 26 08/15/2012 1624   GLUCOSE 88 07/01/2018 1103   GLUCOSE 153 (H) 03/05/2017 2142   GLUCOSE 83 08/15/2012 1624   BUN 8 07/01/2018 1103   BUN 10 08/15/2012 1624   CREATININE 0.56 (L) 07/01/2018 1103   CREATININE 0.55 (L) 08/15/2012 1624   CALCIUM 8.3 (L) 07/01/2018 1103   CALCIUM 9.0 08/15/2012 1624   PROT 5.6 (L) 07/01/2018 1103   PROT 6.5 08/15/2012 1624  ALBUMIN 3.4 (L) 07/01/2018 1103   ALBUMIN 3.4 08/15/2012 1624   AST 15 07/01/2018 1103   AST 22 08/15/2012 1624   ALT 12 07/01/2018 1103   ALT 23 08/15/2012 1624   ALKPHOS 69 07/01/2018 1103   ALKPHOS 98 08/15/2012 1624   BILITOT 0.5 07/01/2018 1103   BILITOT 0.8 08/15/2012 1624   GFRNONAA 112 07/01/2018 1103   GFRNONAA >60 08/15/2012 1624   GFRAA  129 07/01/2018 1103   GFRAA >60 08/15/2012 1624       Component Value Date/Time   WBC 7.5 10/08/2018 1603   WBC 6.9 12/21/2017 1505   RBC 4.68 10/08/2018 1603   RBC 4.76 12/21/2017 1505   HGB 14.2 10/08/2018 1603   HCT 43.4 10/08/2018 1603   PLT 371 10/08/2018 1603   MCV 93 10/08/2018 1603   MCV 91 08/15/2012 1624   MCH 30.3 10/08/2018 1603   MCH 31.1 12/21/2017 1505   MCHC 32.7 10/08/2018 1603   MCHC 33.4 12/21/2017 1505   RDW 12.9 10/08/2018 1603   RDW 15.7 (H) 08/15/2012 1624   LYMPHSABS 1.3 12/21/2017 1505   LYMPHSABS 1.5 10/08/2015 1107   LYMPHSABS 1.9 08/15/2012 1624   MONOABS 0.6 12/21/2017 1505   MONOABS 0.8 08/15/2012 1624   EOSABS 0.3 12/21/2017 1505   EOSABS 0.1 10/08/2015 1107   EOSABS 0.2 08/15/2012 1624   BASOSABS 0.1 12/21/2017 1505   BASOSABS 0.0 10/08/2015 1107   BASOSABS 0.0 08/15/2012 1624    No results found for: POCLITH, LITHIUM   No results found for: PHENYTOIN, PHENOBARB, VALPROATE, CBMZ   .res Assessment: Plan:    Bipolar I disorder ([place])  Mixed obsessional thoughts and acts  Generalized anxiety disorder  Panic disorder with agoraphobia  Insomnia due to mental condition  Post concussion syndrome  Nightmare   Greater than 50% of 30-minute face to face time with patient was spent on counseling and coordination of care. We discussed Good response and stability with psych meds.  However had to stop [place] which is working.  This is very unfortunate.  Historically she has relapse with symptoms whenever she has stopped [place].  Since stopping the [place] she is not had marked depression or mania but had unexpected recurrence of obsessive-compulsive symptoms.  She had these types of symptoms as a teenager but has not had severe symptoms as an adult and recent years.  The increase in Trileptal to 900 mg daily did not help the OCD.  She has not had mood swings however.  She is not markedly depressed.  The increase in fluvoxamine did help with the  OCD and she is tolerated it.  Discussed the risk that SSRIs can trigger mood swings and cause mania.  She is not had any problems with this so far.  Continue Trileptal to 900 mg dayly.    Informed her that we may have to go much higher and to particularly let us know if this insomnia persist.  Discussed side effects of each med in detail.   fluvaxamine 300 mg nightly. Disc DDI with Xanax and use lower dosage.   We discussed the short-term risks associated with benzodiazepines including sedation and increased fall risk among others.  Discussed long-term side effect risk including dependence, potential withdrawal symptoms, and the potential eventual dose-related risk of dementia. Use LED especially in view of the effect of Luvox on the Xanax.  For her tinnitus consider holding the Wellbutrin.  She is on a low-dose of Wellbutrin and she did not have tinnitus until  she had her concussion and Covid.  However Wellbutrin can worsen tinnitus and this gave her the option of stopping it for a week to see if it made any difference.  Call us if she has a problem or question about this.  Getting Control by Fabian Sharp PHD.  Recommend she read this book  FU 4 mos  Meredith Staggers, MD, DFAPA   Please see After Visit Summary for patient specific instructions.  No future appointments.  No orders of the defined types were placed in this encounter.     -------------------------------

## 2020-03-26 ENCOUNTER — Other Ambulatory Visit: Payer: Self-pay | Admitting: Psychiatry

## 2020-03-26 DIAGNOSIS — F422 Mixed obsessional thoughts and acts: Secondary | ICD-10-CM

## 2020-03-30 ENCOUNTER — Telehealth: Payer: Self-pay

## 2020-03-30 NOTE — Telephone Encounter (Signed)
Copied from CRM [id number]. Topic: General - Other >> Mar 30, 2020 11:13 AM Gwenlyn Fudge wrote: Reason for CRM: Pt called to give an update regarding the most recent medication she was prescribed, Topamax. Pt states that she is still having some issues, but she is not sure if it is due to the medication or not. Pt states that the Topamax has helped the headaches, but that she is still experiencing constant ringing in her left ear. Please advise.

## 2020-03-31 NOTE — Telephone Encounter (Signed)
Pt states she had the ringing prior to starting Topamax. Read message from Dr. Sherrie Mustache regarding gradual dosage increases, pt verbalizes understanding .

## 2020-03-31 NOTE — Telephone Encounter (Addendum)
Tried calling patient. Left message to call back. OK for [facility] triage to advise of message below, then route message back to office.

## 2020-03-31 NOTE — Telephone Encounter (Signed)
Is the ringing in her ear new, or did she have that before starting Topamax? If it is new then she should see an ENT.  If she had it before than higher doses of Topamax may help. She go up to 1 1/2 tablets twice a day for a week, then to 2 tablets twice a day if needed, but that would be the maximum dose.  Let me know if this is a new problem and we can send in an ENT referral.

## 2020-04-02 ENCOUNTER — Other Ambulatory Visit: Payer: Self-pay | Admitting: Family Medicine

## 2020-04-02 ENCOUNTER — Other Ambulatory Visit: Payer: Self-pay | Admitting: Psychiatry

## 2020-04-02 NOTE — Telephone Encounter (Signed)
Requested medication (s) are due for refill today - no  Requested medication (s) are on the active medication list -yes  Future visit scheduled -no  Last refill: 02/24/20  Notes to clinic: request for RF on non delegated Rx  Requested Prescriptions  Pending Prescriptions Disp Refills   topiramate (TOPAMAX) 50 MG tablet [Pharmacy Med Name: TOPIRAMATE 50 MG TAB] 60 tablet 1    Sig: TAKE 1/2 EACH MORNING FOR 7 DAYS, THEN 1/2 TABLET TWICE A DAY FOR 7 DAYS, THEN 1 TAB IN MORNING AND 1/2 AT NIGHT FOR 7 DAYS, THEN 1 TABLET TWICE A DAY      Not Delegated - Neurology: Anticonvulsants - topiramate & zonisamide Failed - 04/02/2020  2:08 PM      Failed - This refill cannot be delegated      Failed - Cr in normal range and within 360 days    Creatinine  Date Value Ref Range Status  08/15/2012 0.55 (L) 0.60 - 1.30 mg/dL Final   Creatinine, Ser  Date Value Ref Range Status  07/01/2018 0.56 (L) 0.57 - 1.00 mg/dL Final          Failed - CO2 in normal range and within 360 days    CO2  Date Value Ref Range Status  07/01/2018 21 20 - 29 mmol/L Final   Co2  Date Value Ref Range Status  08/15/2012 26 21 - 32 mmol/L Final          Passed - Valid encounter within last 12 months    Recent Outpatient Visits           2 months ago Episodic paroxysmal hemicrania, not intractable   [facility] Birdie Sons, MD   8 months ago Postconcussive syndrome   [facility] [place], [place], [place]   8 months ago Photosensitivity   [facility] Birdie Sons, MD   12 months ago Persistent headaches   [facility] Birdie Sons, MD   1 year ago Tension headache   [facility] Birdie Sons, [facility]                  Requested Prescriptions  Pending Prescriptions Disp Refills   topiramate (TOPAMAX) 50 MG tablet [Pharmacy Med Name: TOPIRAMATE 50 MG TAB] 60 tablet 1    Sig: TAKE 1/2 EACH MORNING FOR 7 DAYS, THEN 1/2  TABLET TWICE A DAY FOR 7 DAYS, THEN 1 TAB IN MORNING AND 1/2 AT NIGHT FOR 7 DAYS, THEN 1 TABLET TWICE A DAY      Not Delegated - Neurology: Anticonvulsants - topiramate & zonisamide Failed - 04/02/2020  2:08 PM      Failed - This refill cannot be delegated      Failed - Cr in normal range and within 360 days    Creatinine  Date Value Ref Range Status  08/15/2012 0.55 (L) 0.60 - 1.30 mg/dL Final   Creatinine, Ser  Date Value Ref Range Status  07/01/2018 0.56 (L) 0.57 - 1.00 mg/dL Final          Failed - CO2 in normal range and within 360 days    CO2  Date Value Ref Range Status  07/01/2018 21 20 - 29 mmol/L Final   Co2  Date Value Ref Range Status  08/15/2012 26 21 - 32 mmol/L Final          Passed - Valid encounter within last 12 months    Recent Outpatient Visits  2 months ago Episodic paroxysmal hemicrania, not intractable   [facility] Malva Limes, MD   8 months ago Postconcussive syndrome   [facility] Joycelyn Man [place], [place]   8 months ago Photosensitivity   [facility] Malva Limes, MD   12 months ago Persistent headaches   [facility] Malva Limes, MD   1 year ago Tension headache   [facility] Malva Limes, MD

## 2020-04-19 ENCOUNTER — Other Ambulatory Visit: Payer: Self-pay | Admitting: Family Medicine

## 2020-04-19 MED ORDER — TOPIRAMATE 50 MG PO TABS: 50.0000 mg | ORAL_TABLET | Freq: Two times a day (BID) | ORAL | 5 refills | Status: DC

## 2020-04-19 NOTE — Telephone Encounter (Signed)
Copied from CRM [id number]. Topic: Quick Communication - Rx Refill/Question >> Apr 19, 2020 10:24 AM Dalphine Handing A wrote: Medication: topiramate (TOPAMAX) 50 MG tablet (Pharmacy is requesting new prescription be sent over for dosage change for patient; showing 2 tablets twice daily)  Has the patient contacted their pharmacy? Yes (Agent: If no, request that the patient contact the pharmacy for the refill.) (Agent: If yes, when and what did the pharmacy advise?)Contact PCP  Preferred Pharmacy (with phone number or street name):[facility] - [place], [place] - [address]  Phone:  [phone] Fax:  [phone]     Agent: Please be advised that RX refills may take up to 3 business days. We ask that you follow-up with your pharmacy.

## 2020-04-19 NOTE — Telephone Encounter (Signed)
Requested medication (s) are due for refill today: Refilled 04/04/20  Requested medication (s) are on the active medication list: Yes  Last refill:  04/04/20  Future visit scheduled: No  Notes to clinic:  Pharmacy asking for new prescription.    Requested Prescriptions  Pending Prescriptions Disp Refills   topiramate (TOPAMAX) 50 MG tablet 60 tablet 3    Sig: Take 1 tablet (50 mg total) by mouth 2 (two) times daily.      Not Delegated - Neurology: Anticonvulsants - topiramate & zonisamide Failed - 04/19/2020 10:28 AM      Failed - This refill cannot be delegated      Failed - Cr in normal range and within 360 days    Creatinine  Date Value Ref Range Status  08/15/2012 0.55 (L) 0.60 - 1.30 mg/dL Final   Creatinine, Ser  Date Value Ref Range Status  07/01/2018 0.56 (L) 0.57 - 1.00 mg/dL Final          Failed - CO2 in normal range and within 360 days    CO2  Date Value Ref Range Status  07/01/2018 21 20 - 29 mmol/L Final   Co2  Date Value Ref Range Status  08/15/2012 26 21 - 32 mmol/L Final          Passed - Valid encounter within last 12 months    Recent Outpatient Visits           3 months ago Episodic paroxysmal hemicrania, not intractable   [facility] Malva Limes, MD   8 months ago Postconcussive syndrome   [facility] Joycelyn Man [place], [place]   9 months ago Photosensitivity   [facility] Malva Limes, MD   1 year ago Persistent headaches   [facility] Malva Limes, MD   1 year ago Tension headache   [facility] Malva Limes, MD

## 2020-04-28 ENCOUNTER — Other Ambulatory Visit: Payer: Self-pay | Admitting: Psychiatry

## 2020-05-04 ENCOUNTER — Other Ambulatory Visit: Payer: Self-pay | Admitting: Psychiatry

## 2020-05-04 ENCOUNTER — Other Ambulatory Visit: Payer: Self-pay | Admitting: Family Medicine

## 2020-05-04 MED ORDER — TOPIRAMATE 100 MG PO TABS: 100.0000 mg | ORAL_TABLET | Freq: Two times a day (BID) | ORAL | 3 refills | Status: DC

## 2020-05-04 NOTE — Telephone Encounter (Signed)
Please advise pt new prescription for 100mg  tablets was sent to pharmacy so she should take one tablet twice a day

## 2020-05-04 NOTE — Telephone Encounter (Signed)
Patient advised.

## 2020-05-04 NOTE — Telephone Encounter (Signed)
Pt was increased to taking 2 tabs twice a day/ please send corrected Rx to pharmacy for topiramate (TOPAMAX) 50 MG tablet

## 2020-06-07 ENCOUNTER — Other Ambulatory Visit: Payer: Self-pay | Admitting: Psychiatry

## 2020-06-07 DIAGNOSIS — F319 Bipolar disorder, unspecified: Secondary | ICD-10-CM

## 2020-06-17 ENCOUNTER — Other Ambulatory Visit: Payer: Self-pay | Admitting: Psychiatry

## 2020-06-18 ENCOUNTER — Other Ambulatory Visit: Payer: Self-pay | Admitting: Psychiatry

## 2020-06-18 DIAGNOSIS — F422 Mixed obsessional thoughts and acts: Secondary | ICD-10-CM

## 2020-06-18 NOTE — Telephone Encounter (Signed)
Patient called and said that she is going on vacation and needs her medicine ASAP

## 2020-06-29 ENCOUNTER — Other Ambulatory Visit: Payer: Self-pay | Admitting: Psychiatry

## 2020-06-30 NOTE — Telephone Encounter (Signed)
Just till 07/13, this is for future refills Has apt 07/30

## 2020-07-16 ENCOUNTER — Encounter: Payer: Self-pay | Admitting: Psychiatry

## 2020-07-16 ENCOUNTER — Telehealth (INDEPENDENT_AMBULATORY_CARE_PROVIDER_SITE_OTHER): Payer: 59 | Admitting: Psychiatry

## 2020-07-16 DIAGNOSIS — F4001 Agoraphobia with panic disorder: Secondary | ICD-10-CM | POA: Diagnosis not present

## 2020-07-16 DIAGNOSIS — F422 Mixed obsessional thoughts and acts: Secondary | ICD-10-CM

## 2020-07-16 DIAGNOSIS — F411 Generalized anxiety disorder: Secondary | ICD-10-CM | POA: Diagnosis not present

## 2020-07-16 DIAGNOSIS — F319 Bipolar disorder, unspecified: Secondary | ICD-10-CM | POA: Diagnosis not present

## 2020-07-16 DIAGNOSIS — F515 Nightmare disorder: Secondary | ICD-10-CM

## 2020-07-16 DIAGNOSIS — F0781 Postconcussional syndrome: Secondary | ICD-10-CM

## 2020-07-16 DIAGNOSIS — F5105 Insomnia due to other mental disorder: Secondary | ICD-10-CM

## 2020-07-16 NOTE — Telephone Encounter (Signed)
Apt this morning, send for future refills

## 2020-07-16 NOTE — Progress Notes (Signed)
Naliyah Grayce SessionsM Rentfrow [id number] 10/21/1971 49 y.o.  Video Visit via My Chart  I connected with pt by My Chart and verified that I am speaking with the correct person using two identifiers.   I discussed the limitations, risks, security and privacy concerns of performing an evaluation and management service by My Chart  and the availability of in person appointments. I also discussed with the patient that there may be a patient responsible charge related to this service. The patient expressed understanding and agreed to proceed.  I discussed the assessment and treatment plan with the patient. The patient was provided an opportunity to ask questions and all were answered. The patient agreed with the plan and demonstrated an understanding of the instructions.   The patient was advised to call back or seek an in-person evaluation if the symptoms worsen or if the condition fails to improve as anticipated.  I provided 30 minutes of video time during this encounter.  The patient was located at home and the provider was located office. Call started at [phone] and ended [phone]  Subjective:   Patient ID:  Filomena JunglingChrissy M Gabriel is a 49 y.o. (DOB 08/31/1971) female.  Chief Complaint:  Chief Complaint  Patient presents with  . Follow-up  . Anxiety    Anxiety Patient reports no confusion, decreased concentration, nervous/anxious behavior or suicidal ideas.    Depression        Associated symptoms include no decreased concentration and no suicidal ideas.  Past medical history includes anxiety.    Era Grayce SessionsM Stoney presents  today for follow-up of bipolar and anxiety.    at visit May 19 and she tried increase Trileptal 900 mg HS daily.  Tolerated fine.  No SE.  Needs more time to evaluate effectiveness. EMA and Sunday only 4 hours sleep.  No other manic type sx but anxiety and worse last few days.  Stress socially is affecting them.  Just a few nightmares lately.  She had been having more.  visit  March 10 2019.   She could not t afford Latuda $800+ monthly and yet it had worked well.  She's failed multiple other meds.  We had to wean her off of Latuda knowing that could lead to relapse.    visit May 22, 2019.  After stopping the Latuda her mood had remained fairly stable but she had an unexpected worsening of OCD.  She had to stop the JordanLatuda as noted because of cost.  The Trileptal dosage was increased in hopes of offsetting the discontinuation of Latuda.  She had side effects on normal doses of Vraylar but we discussed the option of using Vraylar 3 mg 3 days a week and using samples due to the cost of the medication.  She elected not to pursue that option.  seen September 2020.  Her mood and depression had remained under control however she had a relapse of OCD which had been a problem in younger years.  She was started on fluvoxamine low-dose of to 100 mg daily for that   seen December 2020.  The following was noted: She and H and 2 kids had Covid.  Not that bad for themSome better with OCD and fluvoxamine.  Tolerated the obsessions with less over-powering.  No SE.  No sedation.  No sign mood swings.   It was suggested she increase fluvoxamine to 300 mg nightly to try to further help with OCD. She was content to continue Trileptal 900 mg daily.  As of 3/30/221 :  Overall OK.  Increase fluvoxamine helped OCD and anxiety. Patient reports stable mood and denies depressed or irritable moods.  Patient denies difficulty with sleep initiation or maintenance. Denies appetite disturbance.  Patient reports that energy and motivation have been good.  Patient denies any difficulty with concentration.  Patient denies any suicidal ideation. No med changes.  07/16/20 appt with the following noted: Thinks topiramate makes her labile and impulsive in speech.  Not me at all.  Didn't start until taking that.  Taking it for migraine and tinnitus.  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  Balance issues. No tx  options. Otherwise mood is stable. Staying up late.  Want to stay up all night. Mood lability and irritability and cycling into being too "brave". SX started when increased topiramate to current dose. OCD is a lot better. She thinks Wellbutrin helps depression.  Pleased she's not depressed.  Past Psychiatric Medication Trials: Vraylar 3 restless, Latuda 80, oxcarbazine 900 SE,   risperidone nonresponse, lamotrigine 300, Seroquel weight gain, Abilify 10, lithium, oxcarbazepine, pramipexole,  duloxetine, Lexapro, Wellbutrin, fluoxetine, venlafaxine, sertraline,   clonazepam, alprazolam, buspar NR  Review of Systems:  No tremor, no weakness.  Dizziness.  Ongoing HA  Medications: I have reviewed the patient's current medications.  Current Outpatient Medications  Medication Sig Dispense Refill  . ALPRAZolam (XANAX) 0.5 MG tablet TAKE 1 TABLET BY MOUTH 3 TIMES DAILY AS NEEDED FOR ANXIETY OR SLEEP AND 1 TABLETAT BEDTIME 120 tablet 1  . buPROPion (WELLBUTRIN XL) 150 MG 24 hr tablet TAKE 1 TABLET BY MOUTH EVERY MORNING 30 tablet 5  . fluvoxaMINE (LUVOX) 100 MG tablet TAKE ONE TABLET BY MOUTH AT 6PM AND 2 TABLETS AT BEDTIME 270 tablet 0  . lamoTRIgine (LAMICTAL) 200 MG tablet TAKE ONE TABLET EVERY DAY 90 tablet 0  . levonorgestrel (MIRENA) 20 MCG/24HR IUD by Intrauterine route.    . Oxcarbazepine (TRILEPTAL) 300 MG tablet TAKE ONE TABLET EVERY MORNING AND TAKE TWO TABLETS AT BEDTIME 270 tablet 1  . propranolol (INDERAL) 20 MG tablet TAKE 1 TO 2 TABLETS 3 TIMES DAILY AS NEEDED FOR RESTLESSNESS 180 tablet 0  . topiramate (TOPAMAX) 100 MG tablet Take 1 tablet (100 mg total) by mouth 2 (two) times daily. 60 tablet 3  . XARELTO 10 MG TABS tablet TAKE 1 TABLET BY MOUTH DAILY 30 tablet 12   No current facility-administered medications for this visit.    Medication Side Effects: None  Allergies:  Allergies  Allergen Reactions  . Codeine Nausea Only    Can tolerate medication when taken with food     Past Medical History:  Diagnosis Date  . Anemia   . Anxiety   . Bipolar disorder (HCC)   . Cholelithiasis   . DVT (deep venous thrombosis) (HCC)   . GERD (gastroesophageal reflux disease)   . Kidney stones   . Pulmonary embolism (HCC)   . RLS (restless legs syndrome)     Family History  Problem Relation Age of Onset  . Diabetes Father   . Stroke Father   . Heart attack Father   . Kidney failure Father   . Hypertension Father   . Uterine cancer Maternal Grandmother   . Ulcers Maternal Grandmother   . Diabetes Maternal Grandfather   . Stroke Maternal Grandfather   . Breast cancer Paternal Grandmother   . Colon cancer Paternal Grandfather   . Hypertension Paternal Grandfather     Social History   Socioeconomic History  . Marital status:  Married    Spouse name: Not on file  . Number of children: 3  . Years of education: Not on file  . Highest education level: Not on file  Occupational History  . Occupation: Software engineer: [employer]  Tobacco Use  . Smoking status: Never Smoker  . Smokeless tobacco: Never Used  Vaping Use  . Vaping Use: Never used  Substance and Sexual Activity  . Alcohol use: No    Alcohol/week: 0.0 standard drinks  . Drug use: No  . Sexual activity: Yes    Partners: Male    Birth control/protection: I.U.D.    Comment: mirena  Other Topics Concern  . Not on file  Social History Narrative  . Not on file   Social Determinants of Health   Financial Resource Strain:   . Difficulty of Paying Living Expenses:   Food Insecurity:   . Worried About Programme researcher, broadcasting/film/video in the Last Year:   . Barista in the Last Year:   Transportation Needs:   . Freight forwarder (Medical):   Marland Kitchen Lack of Transportation (Non-Medical):   Physical Activity:   . Days of Exercise per Week:   . Minutes of Exercise per Session:   Stress:   . Feeling of Stress :   Social Connections:   . Frequency of Communication  with Friends and Family:   . Frequency of Social Gatherings with Friends and Family:   . Attends Religious Services:   . Active Member of Clubs or Organizations:   . Attends Banker Meetings:   Marland Kitchen Marital Status:   Intimate Partner Violence:   . Fear of Current or Ex-Partner:   . Emotionally Abused:   Marland Kitchen Physically Abused:   . Sexually Abused:     Past Medical History, Surgical history, Social history, and Family history were reviewed and updated as appropriate.   Please see review of systems for further details on the patient's review from today.   Objective:   Physical Exam:  There were no vitals taken for this visit.  Physical Exam Constitutional:      General: She is not in acute distress.    Appearance: She is well-developed.  Musculoskeletal:        General: No deformity.  Neurological:     Mental Status: She is alert and oriented to person, place, and time.     Cranial Nerves: No dysarthria.     Coordination: Coordination normal.  Psychiatric:        Attention and Perception: Attention and perception normal. She is attentive. She does not perceive auditory or visual hallucinations.        Mood and Affect: Mood is anxious. Mood is not depressed. Affect is not labile, blunt, angry or inappropriate.        Speech: Speech normal.        Behavior: Behavior normal. Behavior is cooperative.        Thought Content: Thought content normal. Thought content is not paranoid or delusional. Thought content does not include homicidal or suicidal ideation. Thought content does not include homicidal or suicidal plan.        Cognition and Memory: Cognition and memory normal.        Judgment: Judgment normal.     Comments: Anxiety and obsessions are improved again with the increase in fluvoxamine. More hypomanic and labile. Talkative     Lab Review:     Component Value Date/Time  NA 139 07/01/2018 1103   NA 140 08/15/2012 1624   K 3.8 07/01/2018 1103   K 3.6  08/15/2012 1624   CL 107 (H) 07/01/2018 1103   CL 106 08/15/2012 1624   CO2 21 07/01/2018 1103   CO2 26 08/15/2012 1624   GLUCOSE 88 07/01/2018 1103   GLUCOSE 153 (H) 03/05/2017 2142   GLUCOSE 83 08/15/2012 1624   BUN 8 07/01/2018 1103   BUN 10 08/15/2012 1624   CREATININE 0.56 (L) 07/01/2018 1103   CREATININE 0.55 (L) 08/15/2012 1624   CALCIUM 8.3 (L) 07/01/2018 1103   CALCIUM 9.0 08/15/2012 1624   PROT 5.6 (L) 07/01/2018 1103   PROT 6.5 08/15/2012 1624   ALBUMIN 3.4 (L) 07/01/2018 1103   ALBUMIN 3.4 08/15/2012 1624   AST 15 07/01/2018 1103   AST 22 08/15/2012 1624   ALT 12 07/01/2018 1103   ALT 23 08/15/2012 1624   ALKPHOS 69 07/01/2018 1103   ALKPHOS 98 08/15/2012 1624   BILITOT 0.5 07/01/2018 1103   BILITOT 0.8 08/15/2012 1624   GFRNONAA 112 07/01/2018 1103   GFRNONAA >60 08/15/2012 1624   GFRAA 129 07/01/2018 1103   GFRAA >60 08/15/2012 1624       Component Value Date/Time   WBC 7.5 10/08/2018 1603   WBC 6.9 12/21/2017 1505   RBC 4.68 10/08/2018 1603   RBC 4.76 12/21/2017 1505   HGB 14.2 10/08/2018 1603   HCT 43.4 10/08/2018 1603   PLT 371 10/08/2018 1603   MCV 93 10/08/2018 1603   MCV 91 08/15/2012 1624   MCH 30.3 10/08/2018 1603   MCH 31.1 12/21/2017 1505   MCHC 32.7 10/08/2018 1603   MCHC 33.4 12/21/2017 1505   RDW 12.9 10/08/2018 1603   RDW 15.7 (H) 08/15/2012 1624   LYMPHSABS 1.3 12/21/2017 1505   LYMPHSABS 1.5 10/08/2015 1107   LYMPHSABS 1.9 08/15/2012 1624   MONOABS 0.6 12/21/2017 1505   MONOABS 0.8 08/15/2012 1624   EOSABS 0.3 12/21/2017 1505   EOSABS 0.1 10/08/2015 1107   EOSABS 0.2 08/15/2012 1624   BASOSABS 0.1 12/21/2017 1505   BASOSABS 0.0 10/08/2015 1107   BASOSABS 0.0 08/15/2012 1624    No results found for: POCLITH, LITHIUM   No results found for: PHENYTOIN, PHENOBARB, VALPROATE, CBMZ   .res Assessment: Plan:    Bipolar I disorder (HCC)  Mixed obsessional thoughts and acts  Generalized anxiety disorder  Panic disorder  with agoraphobia  Insomnia due to mental condition  Post concussion syndrome  Nightmare   Greater than 50% of 30-minute face to face time with patient was spent on counseling and coordination of care. We discussed Good response and stability with psych meds.  However had to stop [name] which is working.  This is very unfortunate.  Historically she has relapse with symptoms whenever she has stopped [name].  Since stopping the [name] she is not had marked depression or mania but had unexpected recurrence of obsessive-compulsive symptoms.  She had these types of symptoms as a teenager but has not had severe symptoms as an adult and recent years.  The increase in Trileptal to 900 mg daily did not help the OCD. She is not markedly depressed.  The increase in fluvoxamine did help with the OCD and she is tolerated it.  Discussed the risk that SSRIs can trigger mood swings and cause mania.   She is hypomanic and more labile than at the last visit she is rapid cycling but is not severe.  She attributes to this  to the increase in Topamax because she said she did not have that symptom until the Topamax was increased.  Continue Trileptal to 900 mg dayly.    Informed her that we may have to go much higher and to particularly let us know if this insomnia persist.  Discussed side effects of each med in detail.   fluvaxamine 300 mg nightly. Disc DDI with Xanax and use lower dosage.   Reduce topiramate to 50 mg BID over a week DT she attributes it causing hypomania.  We discussed the short-term risks associated with benzodiazepines including sedation and increased fall risk among others.  Discussed long-term side effect risk including dependence, potential withdrawal symptoms, and the potential eventual dose-related risk of dementia. Use LED especially in view of the effect of Luvox on the Xanax.  [facility] DT migraine unresponsive to topiramate.  Getting Control by Fabian Sharp PHD.  Recommend  she read this book  FU 6 weeks.  Meredith Staggers, MD, DFAPA   Please see After Visit Summary for patient specific instructions.  No future appointments.  No orders of the defined types were placed in this encounter.     -------------------------------

## 2020-07-23 ENCOUNTER — Other Ambulatory Visit: Payer: Self-pay | Admitting: Psychiatry

## 2020-07-23 DIAGNOSIS — F422 Mixed obsessional thoughts and acts: Secondary | ICD-10-CM

## 2020-08-25 IMAGING — CT CT ABD-PELV W/ CM
1 of 3 series · 13 of 32 positions shown, 17 images · IV contrast (APPLIED)
Comparison: May 25, 2015 and September 17, 2013

CLINICAL DATA: Left flank pain.  Weight loss

EXAM:
CT ABDOMEN AND PELVIS WITH CONTRAST
TECHNIQUE: Multidetector CT imaging of the abdomen and pelvis was performed
using the standard protocol following bolus administration of
intravenous contrast. Oral contrast was also administered.
CONTRAST:  100mL OMNIPAQUE IOHEXOL 300 MG/ML  SOLN

[Series 2: axial st · axial · 0.73mm/px · z∈[-482,-42]mm · 13 of 100 slices shown, 17 images]
[im 6/100  soft-tissue]
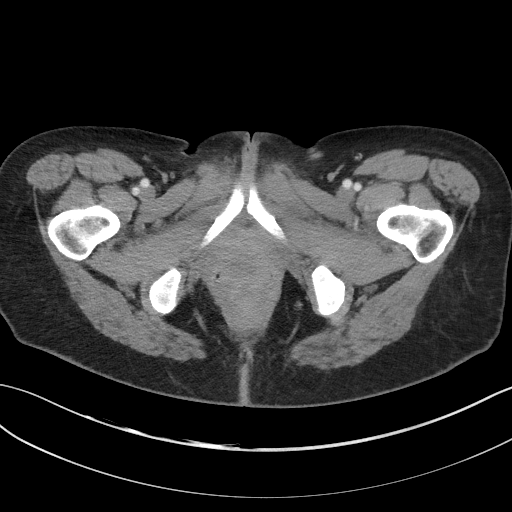
[im 6/100  bone]
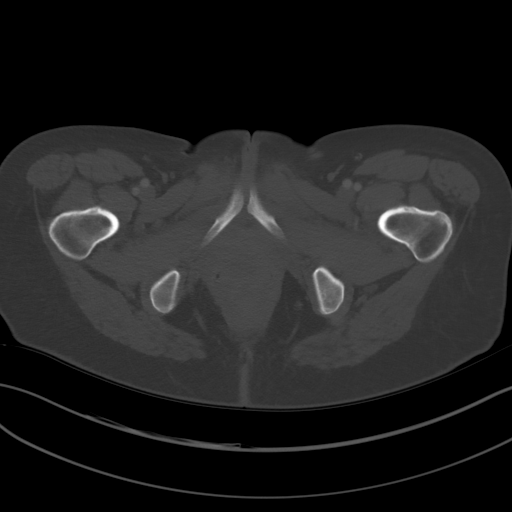
[im 16/100  soft-tissue]
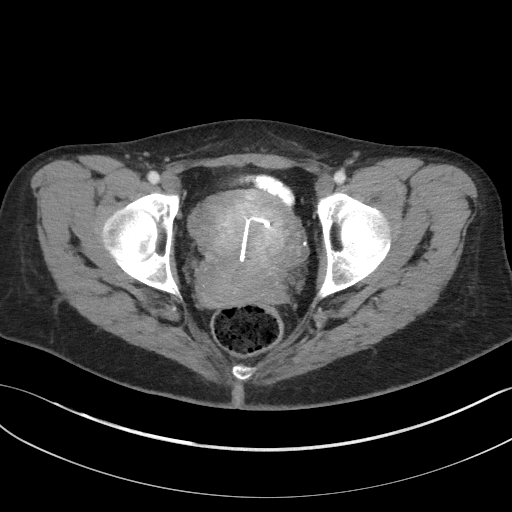
[im 27/100  soft-tissue]
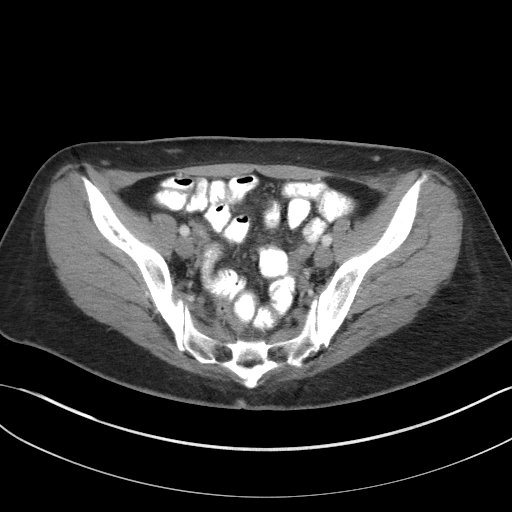
[im 32/100  soft-tissue]
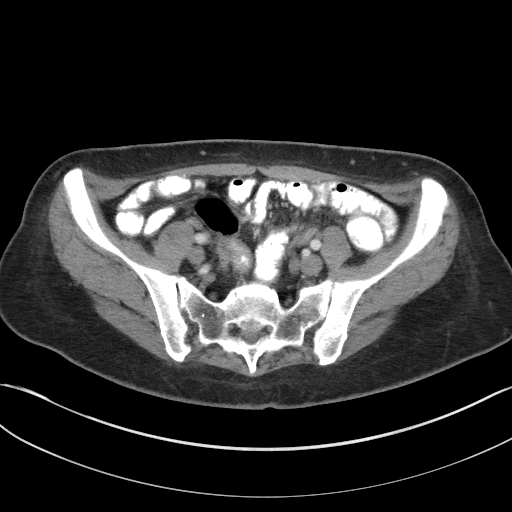
[im 42/100  soft-tissue]
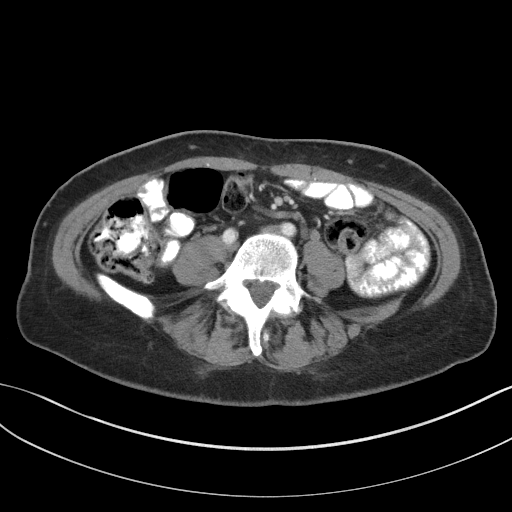
[im 53/100  soft-tissue]
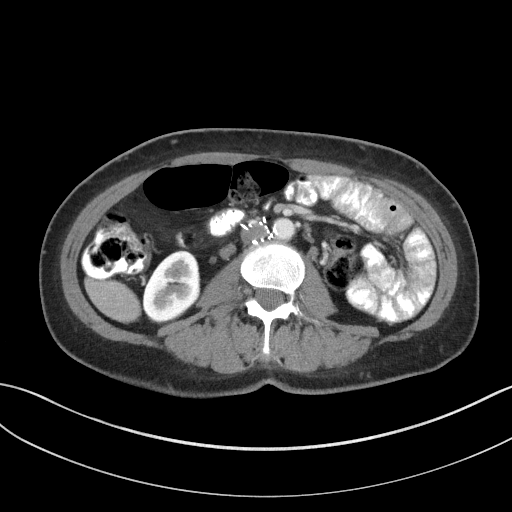
[im 58/100  soft-tissue]
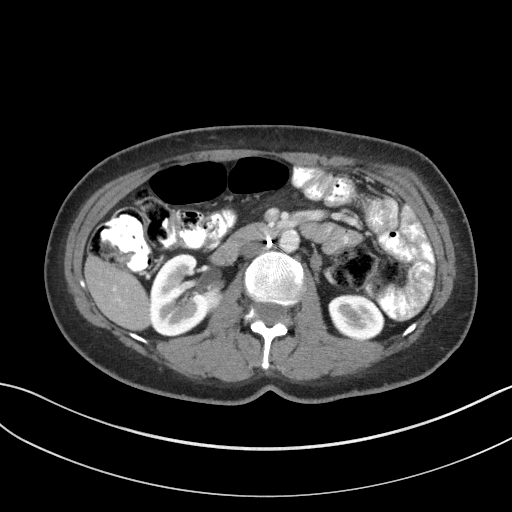
[im 68/100  soft-tissue]
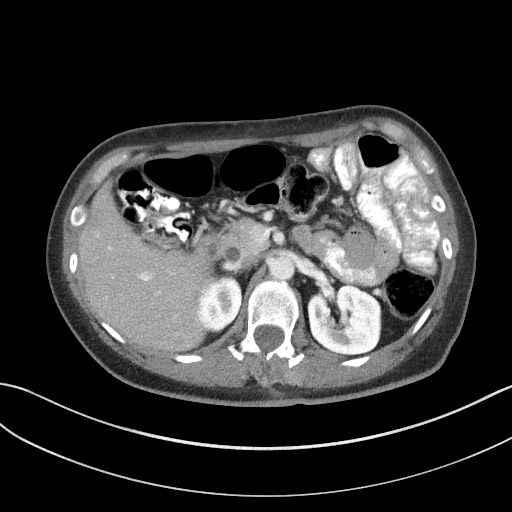
[im 73/100  soft-tissue]
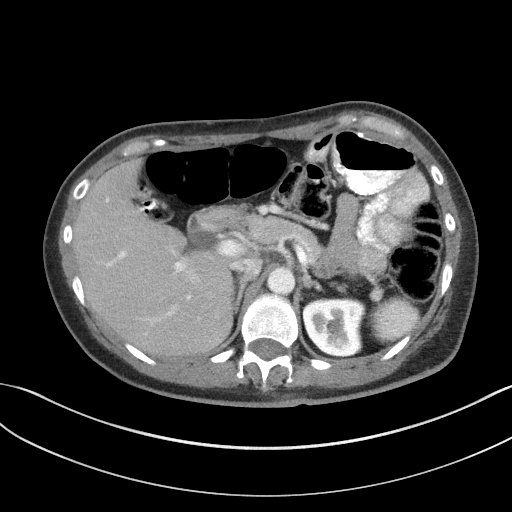
[im 73/100  bone]
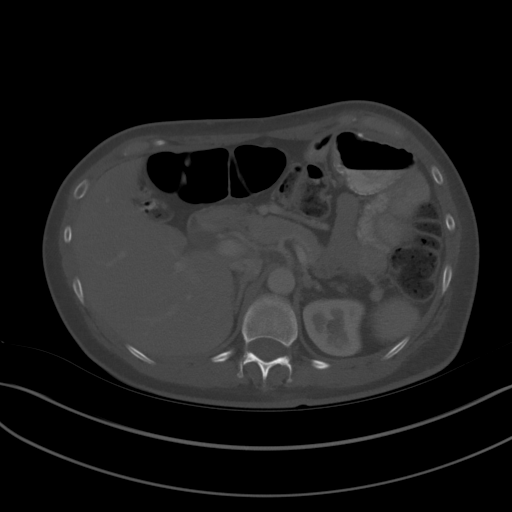
[im 79/100  lung]
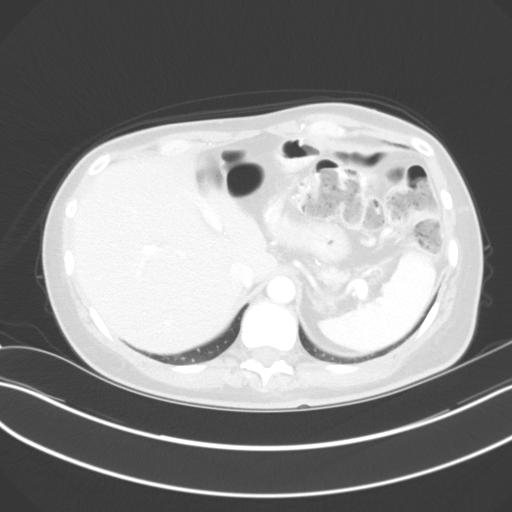
[im 84/100  soft-tissue]
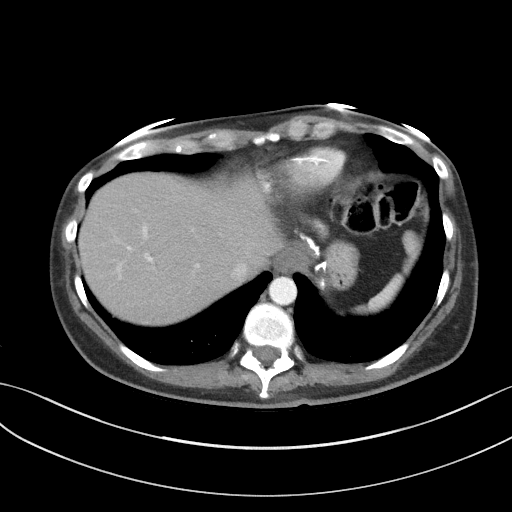
[im 84/100  lung]
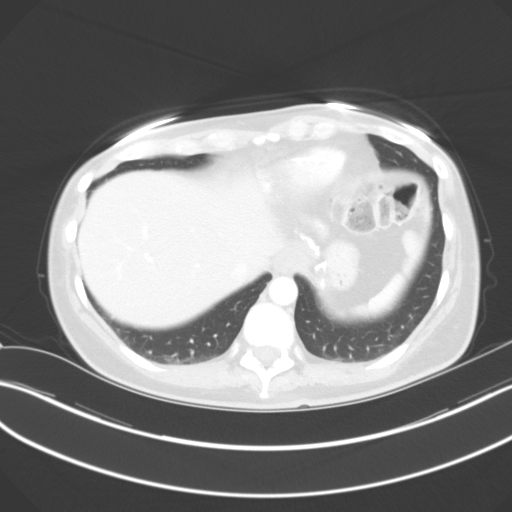
[im 89/100  lung]
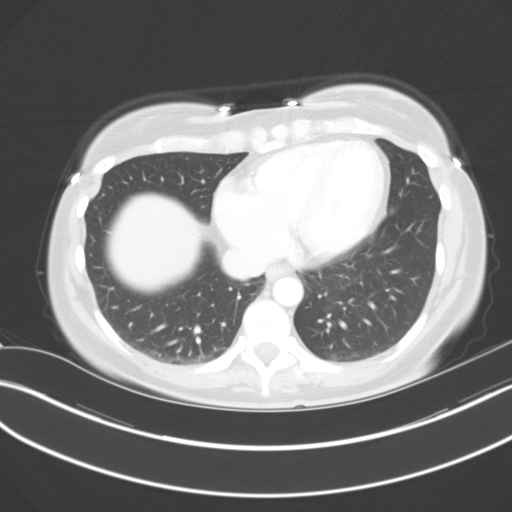
[im 94/100  soft-tissue]
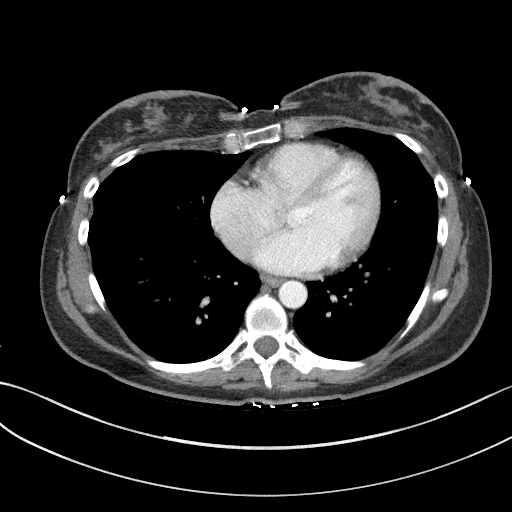
[im 94/100  lung]
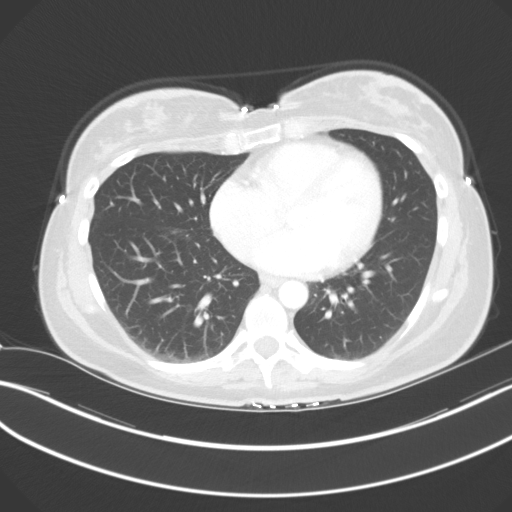

[13 of 32 positions shown; findings below may reference images not displayed]

FINDINGS: Lower chest: There is mild bibasilar atelectasis. No lung base edema
or consolidation.

Hepatobiliary: There is hepatic steatosis. There is a piece of wire
noted in the right lobe of the liver inferiorly, a stable finding
that likely is secondary to a previous biliary procedure. No focal
liver lesions are identified beyond this strand of wire. Gallbladder
is absent. There is no intrahepatic biliary duct dilatation. Common
bile duct is prominent measuring 11 mm with tapering distally. No
biliary duct mass or calculus evident.

Pancreas: There is no evident pancreatic mass or inflammatory focus.

Spleen: No splenic lesions are evident.

Adrenals/Urinary Tract: Right adrenal appears normal. There is
stable mild left adrenal hypertrophy. Kidneys bilaterally show no
evident mass or hydronephrosis on either side. There is no evident
renal or ureteral calculus on either side. Urinary bladder is
midline without bowel wall or mesenteric thickening.

Stomach/Bowel: Patient is status post gastric bypass procedure.
There is no wall thickening or fluid in the region of prior surgery.
Elsewhere, there is no appreciable bowel wall or mesenteric
thickening. There is no evident bowel obstruction. There is no free
air or portal venous air.

Vascular/Lymphatic: There is a filter in the inferior vena cava.
Several struts of the filter extend outside of the inferior vena
cava, stable from the previous studies. There is aortic and iliac
artery atherosclerosis. There is no aneurysm present. Major
mesenteric arterial vessels appear patent. There is no adenopathy in
the abdomen or pelvis.

Reproductive: Uterus is anteverted. There is an intrauterine device
positioned within the endometrium. The uterus has a somewhat
inhomogeneous attenuation pattern suggesting underlying
leiomyomatous change. No mass is seen outside of the uterus in the
pelvis.

Other: Appendix appears unremarkable. There is no abscess or ascites
in the abdomen or pelvis.

Musculoskeletal: There is lumbar levoscoliosis. There are no blastic
or lytic bone lesions. There is no intramuscular or abdominal wall
lesion evident.
IMPRESSION: 1. Status post gastric bypass procedure without complicating
features. No evident bowel obstruction. No abscess in the abdomen or
pelvis. Appendix region appears normal.

2. Hepatic steatosis. Stable piece of wire noted in right lobe of
liver. Status post cholecystectomy. Mild prominence of the biliary
ductal system is stable. No biliary duct mass or calculus evident.

3. Intrauterine device positioned within the endometrium. Suspect a
degree of leiomyomatous change within the uterus.

4. Inferior vena caval filter present with several struts extending
outside of the lumen of the inferior vena cava, a stable finding.

5.  No evident renal or ureteral calculus.

6.  Mild left adrenal hypertrophy, stable.

7.  Aortoiliac atherosclerosis.

Aortic Atherosclerosis ([id number].P).

## 2020-08-26 ENCOUNTER — Other Ambulatory Visit: Payer: Self-pay | Admitting: Family Medicine

## 2020-09-13 ENCOUNTER — Other Ambulatory Visit: Payer: Self-pay | Admitting: Family Medicine

## 2020-09-29 ENCOUNTER — Encounter: Payer: Self-pay | Admitting: Psychiatry

## 2020-09-29 ENCOUNTER — Telehealth (INDEPENDENT_AMBULATORY_CARE_PROVIDER_SITE_OTHER): Payer: 59 | Admitting: Psychiatry

## 2020-09-29 DIAGNOSIS — F0781 Postconcussional syndrome: Secondary | ICD-10-CM

## 2020-09-29 DIAGNOSIS — F515 Nightmare disorder: Secondary | ICD-10-CM

## 2020-09-29 DIAGNOSIS — F422 Mixed obsessional thoughts and acts: Secondary | ICD-10-CM

## 2020-09-29 DIAGNOSIS — F319 Bipolar disorder, unspecified: Secondary | ICD-10-CM | POA: Diagnosis not present

## 2020-09-29 DIAGNOSIS — F4001 Agoraphobia with panic disorder: Secondary | ICD-10-CM

## 2020-09-29 DIAGNOSIS — F411 Generalized anxiety disorder: Secondary | ICD-10-CM

## 2020-09-29 DIAGNOSIS — G43009 Migraine without aura, not intractable, without status migrainosus: Secondary | ICD-10-CM

## 2020-09-29 DIAGNOSIS — F5105 Insomnia due to other mental disorder: Secondary | ICD-10-CM

## 2020-09-29 MED ORDER — FLUVOXAMINE MALEATE 100 MG PO TABS: 300.0000 mg | ORAL_TABLET | Freq: Every day | ORAL | 1 refills | Status: DC

## 2020-09-29 MED ORDER — ALPRAZOLAM 0.5 MG PO TABS: 0.5000 mg | ORAL_TABLET | Freq: Three times a day (TID) | ORAL | 3 refills | Status: DC | PRN

## 2020-09-29 MED ORDER — BUPROPION HCL ER (XL) 150 MG PO TB24: 150.0000 mg | ORAL_TABLET | Freq: Every morning | ORAL | 1 refills | Status: DC

## 2020-09-29 MED ORDER — TOPIRAMATE 50 MG PO TABS: ORAL_TABLET | ORAL | 1 refills | Status: DC

## 2020-09-29 MED ORDER — LAMOTRIGINE 200 MG PO TABS: 200.0000 mg | ORAL_TABLET | Freq: Every day | ORAL | 1 refills | Status: DC

## 2020-09-29 MED ORDER — OXCARBAZEPINE 300 MG PO TABS: ORAL_TABLET | ORAL | 1 refills | Status: DC

## 2020-09-29 NOTE — Progress Notes (Signed)
Kristen [name] [id number] Apr 26, 1971 49 y.o.   Video Visit via My Chart  I connected with pt by My Chart and verified that I am speaking with the correct person using two identifiers.   I discussed the limitations, risks, security and privacy concerns of performing an evaluation and management service by My Chart  and the availability of in person appointments. I also discussed with the patient that there may be a patient responsible charge related to this service. The patient expressed understanding and agreed to proceed.  I discussed the assessment and treatment plan with the patient. The patient was provided an opportunity to ask questions and all were answered. The patient agreed with the plan and demonstrated an understanding of the instructions.   The patient was advised to call back or seek an in-person evaluation if the symptoms worsen or if the condition fails to improve as anticipated.  I provided 30 minutes of video time during this encounter.  The patient was located at home and the provider was located office. Started at 1100 and ended at 1130.  Subjective:   Patient ID:  Kristen [name] is a 49 y.o. (DOB 1971-02-10) female.  Chief Complaint:  Chief Complaint  Patient presents with  . Follow-up  . Depression  . Anxiety  . Headache    HPI Kristen [name] presents to the office today for follow-up of bipolar disorder and anxiety   07/16/20 appt with the following noted: Thinks topiramate makes her labile and impulsive in speech.  Not me at all.  Didn't start until taking that.  Taking it for migraine and tinnitus.  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  Balance issues. No tx options. Otherwise mood is stable. Staying up late.  Want to stay up all night. Mood lability and irritability and cycling into being too "brave". SX started when increased topiramate to current dose. OCD is a lot better. She thinks Wellbutrin helps depression.  Pleased she's not  depressed. Plan: Continue Trileptal to 900 mg dayly.     fluvaxamine 300 mg nightly. Continue Wellbutrin XL 150 every morning Reducec topiramate to 50 mg AM and 100 PM bc she felt it contributed to manic sx  09/29/2020 appointment with the following noted: Better with reduced topiramate with resolution of HA and manic subsided quite a bit. Not depressed.  Mood evened out. No SE meds. Needs refill fo Xanax and takes it nightly.  Mistake and received 0.25 instead of 0.5 mg tablets.  Not needed much daytime for anxiety. Patient reports stable mood and denies depressed or irritable moods.  Patient denies any recent difficulty with anxiety.  Patient denies difficulty with sleep initiation or maintenance. With meds 6-7 hours of sleep.  Denies appetite disturbance.  Patient reports that energy and motivation have been good.  Patient denies any difficulty with concentration.  Patient denies any suicidal ideation.   Past Psychiatric Medication Trials: Vraylar 3 restless, Latuda 80, oxcarbazine 900 SE,   risperidone nonresponse, lamotrigine 300, Seroquel weight gain, Abilify 10, lithium, oxcarbazepine 900,  topiramate  100 mg BID hypomania. pramipexole,  duloxetine, Lexapro, Wellbutrin, fluoxetine, venlafaxine, sertraline,  fluvoxamine 300 clonazepam, alprazolam, buspar NR  Review of Systems:  Review of Systems  Neurological: Negative for tremors, weakness and headaches.    Medications: I have reviewed the patient's current medications.  Current Outpatient Medications  Medication Sig Dispense Refill  . levonorgestrel (MIRENA) 20 MCG/24HR IUD by Intrauterine route.    . propranolol (INDERAL) 20 MG tablet  TAKE 1 TO 2 TABLETS 3 TIMES DAILY AS NEEDED FOR RESTLESSNESS 180 tablet 0  . rivaroxaban (XARELTO) 10 MG TABS tablet Take 1 tablet (10 mg total) by mouth daily. 30 tablet 12  . ALPRAZolam (XANAX) 0.5 MG tablet Take 1 tablet (0.5 mg total) by mouth 3 (three) times daily as needed for anxiety. 90  tablet 3  . buPROPion (WELLBUTRIN XL) 150 MG 24 hr tablet Take 1 tablet (150 mg total) by mouth every morning. 90 tablet 1  . fluvoxaMINE (LUVOX) 100 MG tablet Take 3 tablets (300 mg total) by mouth daily. 270 tablet 1  . lamoTRIgine (LAMICTAL) 200 MG tablet Take 1 tablet (200 mg total) by mouth daily. 90 tablet 1  . Oxcarbazepine (TRILEPTAL) 300 MG tablet TAKE ONE TABLET EVERY MORNING AND TAKE TWO TABLETS AT BEDTIME 270 tablet 1  . topiramate (TOPAMAX) 50 MG tablet 1 tablet in the morning and 2 tablets in the evening 270 tablet 1   No current facility-administered medications for this visit.    Medication Side Effects: None  Allergies:  Allergies  Allergen Reactions  . Codeine Nausea Only    Can tolerate medication when taken with food    Past Medical History:  Diagnosis Date  . Anemia   . Anxiety   . Bipolar disorder (HCC)   . Cholelithiasis   . DVT (deep venous thrombosis) (HCC)   . GERD (gastroesophageal reflux disease)   . Kidney stones   . Pulmonary embolism (HCC)   . RLS (restless legs syndrome)     Family History  Problem Relation Age of Onset  . Diabetes Father   . Stroke Father   . Heart attack Father   . Kidney failure Father   . Hypertension Father   . Uterine cancer Maternal Grandmother   . Ulcers Maternal Grandmother   . Diabetes Maternal Grandfather   . Stroke Maternal Grandfather   . Breast cancer Paternal Grandmother   . Colon cancer Paternal Grandfather   . Hypertension Paternal Grandfather     Social History   Socioeconomic History  . Marital status: Married    Spouse name: Not on file  . Number of children: 3  . Years of education: Not on file  . Highest education level: Not on file  Occupational History  . Occupation: Software engineer: [employer]  Tobacco Use  . Smoking status: Never Smoker  . Smokeless tobacco: Never Used  Vaping Use  . Vaping Use: Never used  Substance and Sexual Activity  . Alcohol  use: No    Alcohol/week: 0.0 standard drinks  . Drug use: No  . Sexual activity: Yes    Partners: Male    Birth control/protection: I.U.D.    Comment: mirena  Other Topics Concern  . Not on file  Social History Narrative  . Not on file   Social Determinants of Health   Financial Resource Strain:   . Difficulty of Paying Living Expenses: Not on file  Food Insecurity:   . Worried About Programme researcher, broadcasting/film/video in the Last Year: Not on file  . Ran Out of Food in the Last Year: Not on file  Transportation Needs:   . Lack of Transportation (Medical): Not on file  . Lack of Transportation (Non-Medical): Not on file  Physical Activity:   . Days of Exercise per Week: Not on file  . Minutes of Exercise per Session: Not on file  Stress:   . Feeling of Stress :  Not on file  Social Connections:   . Frequency of Communication with Friends and Family: Not on file  . Frequency of Social Gatherings with Friends and Family: Not on file  . Attends Religious Services: Not on file  . Active Member of Clubs or Organizations: Not on file  . Attends Banker Meetings: Not on file  . Marital Status: Not on file  Intimate Partner Violence:   . Fear of Current or Ex-Partner: Not on file  . Emotionally Abused: Not on file  . Physically Abused: Not on file  . Sexually Abused: Not on file    Past Medical History, Surgical history, Social history, and Family history were reviewed and updated as appropriate.   Please see review of systems for further details on the patient's review from today.   Objective:   Physical Exam:  There were no vitals taken for this visit.  Physical Exam Neurological:     Mental Status: She is alert and oriented to person, place, and time.     Cranial Nerves: No dysarthria.  Psychiatric:        Attention and Perception: Attention and perception normal.        Mood and Affect: Mood normal. Mood is not anxious or depressed.        Speech: Speech normal.         Behavior: Behavior is cooperative.        Thought Content: Thought content normal. Thought content is not paranoid or delusional. Thought content does not include homicidal or suicidal ideation. Thought content does not include homicidal or suicidal plan.        Cognition and Memory: Cognition and memory normal.        Judgment: Judgment normal.     Comments: Insight intact Minimal anxiety Hypomania largely resolved     Lab Review:     Component Value Date/Time   NA 139 07/01/2018 1103   NA 140 08/15/2012 1624   K 3.8 07/01/2018 1103   K 3.6 08/15/2012 1624   CL 107 (H) 07/01/2018 1103   CL 106 08/15/2012 1624   CO2 21 07/01/2018 1103   CO2 26 08/15/2012 1624   GLUCOSE 88 07/01/2018 1103   GLUCOSE 153 (H) 03/05/2017 2142   GLUCOSE 83 08/15/2012 1624   BUN 8 07/01/2018 1103   BUN 10 08/15/2012 1624   CREATININE 0.56 (L) 07/01/2018 1103   CREATININE 0.55 (L) 08/15/2012 1624   CALCIUM 8.3 (L) 07/01/2018 1103   CALCIUM 9.0 08/15/2012 1624   PROT 5.6 (L) 07/01/2018 1103   PROT 6.5 08/15/2012 1624   ALBUMIN 3.4 (L) 07/01/2018 1103   ALBUMIN 3.4 08/15/2012 1624   AST 15 07/01/2018 1103   AST 22 08/15/2012 1624   ALT 12 07/01/2018 1103   ALT 23 08/15/2012 1624   ALKPHOS 69 07/01/2018 1103   ALKPHOS 98 08/15/2012 1624   BILITOT 0.5 07/01/2018 1103   BILITOT 0.8 08/15/2012 1624   GFRNONAA 112 07/01/2018 1103   GFRNONAA >60 08/15/2012 1624   GFRAA 129 07/01/2018 1103   GFRAA >60 08/15/2012 1624       Component Value Date/Time   WBC 7.5 10/08/2018 1603   WBC 6.9 12/21/2017 1505   RBC 4.68 10/08/2018 1603   RBC 4.76 12/21/2017 1505   HGB 14.2 10/08/2018 1603   HCT 43.4 10/08/2018 1603   PLT 371 10/08/2018 1603   MCV 93 10/08/2018 1603   MCV 91 08/15/2012 1624   MCH 30.3 10/08/2018 1603  MCH 31.1 12/21/2017 1505   MCHC 32.7 10/08/2018 1603   MCHC 33.4 12/21/2017 1505   RDW 12.9 10/08/2018 1603   RDW 15.7 (H) 08/15/2012 1624   LYMPHSABS 1.3 12/21/2017 1505    LYMPHSABS 1.5 10/08/2015 1107   LYMPHSABS 1.9 08/15/2012 1624   MONOABS 0.6 12/21/2017 1505   MONOABS 0.8 08/15/2012 1624   EOSABS 0.3 12/21/2017 1505   EOSABS 0.1 10/08/2015 1107   EOSABS 0.2 08/15/2012 1624   BASOSABS 0.1 12/21/2017 1505   BASOSABS 0.0 10/08/2015 1107   BASOSABS 0.0 08/15/2012 1624    No results found for: POCLITH, LITHIUM   No results found for: PHENYTOIN, PHENOBARB, VALPROATE, CBMZ   .res Assessment: Plan:    Kristen [name] was seen today for follow-up, depression, anxiety and headache.  Diagnoses and all orders for this visit:  Bipolar I disorder (HCC) -     buPROPion (WELLBUTRIN XL) 150 MG 24 hr tablet; Take 1 tablet (150 mg total) by mouth every morning. -     lamoTRIgine (LAMICTAL) 200 MG tablet; Take 1 tablet (200 mg total) by mouth daily. -     Oxcarbazepine (TRILEPTAL) 300 MG tablet; TAKE ONE TABLET EVERY MORNING AND TAKE TWO TABLETS AT BEDTIME  Mixed obsessional thoughts and acts -     fluvoxaMINE (LUVOX) 100 MG tablet; Take 3 tablets (300 mg total) by mouth daily.  Generalized anxiety disorder  Panic disorder with agoraphobia -     ALPRAZolam (XANAX) 0.5 MG tablet; Take 1 tablet (0.5 mg total) by mouth 3 (three) times daily as needed for anxiety.  Insomnia due to mental condition  Nightmare  Post concussion syndrome  Migraine without aura and without status migrainosus, not intractable -     topiramate (TOPAMAX) 50 MG tablet; 1 tablet in the morning and 2 tablets in the evening      .Greater than 50% of 30-minute face to face time with patient was spent on counseling and coordination of care. We discussed Good response and stability with psych meds.  However had to stop [name] which is working.  This is very unfortunate.  Historically she has relapse with symptoms whenever she has stopped [name].  Since stopping the [name] she is not had marked depression or mania but had unexpected recurrence of obsessive-compulsive symptoms.  She had these  types of symptoms as a teenager but has not had severe symptoms as an adult and recent years.  The increase in Trileptal to 900 mg daily did not help the OCD. She is not markedly depressed.  The increase in fluvoxamine did help with the OCD and she is tolerated it.  Discussed the risk that SSRIs can trigger mood swings and cause mania.   She is hypomanic and more labile than at the last visit she is rapid cycling but is not severe.  She attributes to this to the increase in Topamax because she said she did not have that symptom until the Topamax was increased.  Continue Trileptal to 900 mg dayly.    Informed her that we may have to go much higher and to particularly let us know if this insomnia persist.  Discussed side effects of each med in detail.   fluvaxamine 300 mg nightly. Disc DDI with Xanax and use lower dosage.   Continue topiramate 50 mg in the morning and 100 mg in the evening.  The reduction resolved hypomania.  This is controlling headache  Continue Wellbutrin XL 150 mg every morning because it helped with depression and she is  tolerating it well.  We discussed the short-term risks associated with benzodiazepines including sedation and increased fall risk among others.  Discussed long-term side effect risk including dependence, potential withdrawal symptoms, and the potential eventual dose-related risk of dementia. Use LED especially in view of the effect of Luvox on the Xanax.  Follow-up 4 months or sooner as needed  Meredith Staggersarey Cottle MD, DFAPA  Please see After Visit Summary for patient specific instructions.  No future appointments.  No orders of the defined types were placed in this encounter.   -------------------------------

## 2020-10-25 ENCOUNTER — Other Ambulatory Visit: Payer: Self-pay | Admitting: Psychiatry

## 2020-11-01 ENCOUNTER — Other Ambulatory Visit: Payer: Self-pay | Admitting: Neurology

## 2020-11-01 DIAGNOSIS — Z87820 Personal history of traumatic brain injury: Secondary | ICD-10-CM

## 2020-11-09 ENCOUNTER — Other Ambulatory Visit: Payer: Self-pay

## 2020-11-09 ENCOUNTER — Ambulatory Visit (HOSPITAL_COMMUNITY)
Admission: RE | Admit: 2020-11-09 | Discharge: 2020-11-09 | Disposition: A | Discharge status: Home / Self Care | Payer: 59 | Source: Ambulatory Visit | Attending: Neurology | Admitting: Neurology

## 2020-11-09 DIAGNOSIS — Z87820 Personal history of traumatic brain injury: Secondary | ICD-10-CM | POA: Insufficient documentation

## 2020-11-09 MED ORDER — GADOBUTROL 1 MMOL/ML IV SOLN
7.3000 mL | Freq: Once | INTRAVENOUS | Status: AC | PRN
  Administered 2020-11-09: 7.3 mL via INTRAVENOUS

## 2020-11-29 ENCOUNTER — Other Ambulatory Visit: Payer: Self-pay | Admitting: Neurology

## 2020-11-29 ENCOUNTER — Encounter: Payer: Self-pay | Admitting: Radiology

## 2020-11-29 DIAGNOSIS — M5030 Other cervical disc degeneration, unspecified cervical region: Secondary | ICD-10-CM

## 2020-12-07 ENCOUNTER — Ambulatory Visit
Admission: RE | Admit: 2020-12-07 | Discharge: 2020-12-07 | Disposition: A | Discharge status: Home / Self Care | Payer: 59 | Source: Ambulatory Visit | Attending: Neurology | Admitting: Neurology

## 2020-12-07 ENCOUNTER — Other Ambulatory Visit: Payer: Self-pay

## 2020-12-07 DIAGNOSIS — M5030 Other cervical disc degeneration, unspecified cervical region: Secondary | ICD-10-CM

## 2020-12-31 ENCOUNTER — Other Ambulatory Visit: Payer: Self-pay | Admitting: Psychiatry

## 2020-12-31 DIAGNOSIS — F319 Bipolar disorder, unspecified: Secondary | ICD-10-CM

## 2020-12-31 NOTE — Telephone Encounter (Signed)
Call to RS

## 2021-01-05 NOTE — Telephone Encounter (Signed)
Next appt is 02/28/21

## 2021-01-21 ENCOUNTER — Other Ambulatory Visit: Payer: Self-pay | Admitting: Psychiatry

## 2021-01-21 DIAGNOSIS — G43009 Migraine without aura, not intractable, without status migrainosus: Secondary | ICD-10-CM

## 2021-02-28 ENCOUNTER — Encounter: Payer: Self-pay | Admitting: Psychiatry

## 2021-02-28 ENCOUNTER — Ambulatory Visit (INDEPENDENT_AMBULATORY_CARE_PROVIDER_SITE_OTHER): Payer: 59 | Admitting: Psychiatry

## 2021-02-28 ENCOUNTER — Other Ambulatory Visit: Payer: Self-pay

## 2021-02-28 DIAGNOSIS — F411 Generalized anxiety disorder: Secondary | ICD-10-CM

## 2021-02-28 DIAGNOSIS — F5105 Insomnia due to other mental disorder: Secondary | ICD-10-CM

## 2021-02-28 DIAGNOSIS — F0781 Postconcussional syndrome: Secondary | ICD-10-CM

## 2021-02-28 DIAGNOSIS — F422 Mixed obsessional thoughts and acts: Secondary | ICD-10-CM | POA: Diagnosis not present

## 2021-02-28 DIAGNOSIS — F4001 Agoraphobia with panic disorder: Secondary | ICD-10-CM

## 2021-02-28 DIAGNOSIS — F515 Nightmare disorder: Secondary | ICD-10-CM

## 2021-02-28 DIAGNOSIS — G43009 Migraine without aura, not intractable, without status migrainosus: Secondary | ICD-10-CM

## 2021-02-28 DIAGNOSIS — F319 Bipolar disorder, unspecified: Secondary | ICD-10-CM | POA: Diagnosis not present

## 2021-02-28 MED ORDER — PROPRANOLOL HCL 20 MG PO TABS
ORAL_TABLET | ORAL | 1 refills | Status: DC
Start: 2021-02-28 — End: 2021-05-06

## 2021-02-28 MED ORDER — LAMOTRIGINE 200 MG PO TABS: 200.0000 mg | ORAL_TABLET | Freq: Every day | ORAL | 1 refills | Status: DC

## 2021-02-28 MED ORDER — FLUVOXAMINE MALEATE 100 MG PO TABS: 300.0000 mg | ORAL_TABLET | Freq: Every day | ORAL | 1 refills | Status: DC

## 2021-02-28 MED ORDER — BUPROPION HCL ER (XL) 150 MG PO TB24: 150.0000 mg | ORAL_TABLET | Freq: Every morning | ORAL | 1 refills | Status: DC

## 2021-02-28 MED ORDER — ALPRAZOLAM 0.5 MG PO TABS: 0.5000 mg | ORAL_TABLET | Freq: Three times a day (TID) | ORAL | 3 refills | Status: DC | PRN

## 2021-02-28 MED ORDER — TOPIRAMATE 50 MG PO TABS: ORAL_TABLET | ORAL | 1 refills | Status: DC

## 2021-02-28 MED ORDER — OXCARBAZEPINE 300 MG PO TABS: ORAL_TABLET | ORAL | 1 refills | Status: DC

## 2021-02-28 NOTE — Patient Instructions (Signed)
(  [facility]) N-Acetylcysteine at [phone] mg daily to help with mild cognitive problems.

## 2021-02-28 NOTE — Progress Notes (Signed)
Kristen [name] [id number] 26-Feb-1971 50 y.o.   Video Visit via My Chart  I connected with pt by My Chart and verified that I am speaking with the correct person using two identifiers.   I discussed the limitations, risks, security and privacy concerns of performing an evaluation and management service by My Chart  and the availability of in person appointments. I also discussed with the patient that there may be a patient responsible charge related to this service. The patient expressed understanding and agreed to proceed.  I discussed the assessment and treatment plan with the patient. The patient was provided an opportunity to ask questions and all were answered. The patient agreed with the plan and demonstrated an understanding of the instructions.   The patient was advised to call back or seek an in-person evaluation if the symptoms worsen or if the condition fails to improve as anticipated.  I provided 30 minutes of video time during this encounter.  The patient was located at home and the provider was located office. Started at 1100 and ended at 1130.  Subjective:   Patient ID:  Kristen [name] is a 50 y.o. (DOB [birth year]) female.  Chief Complaint:  Chief Complaint  Patient presents with  . Follow-up  . Bipolar I disorder (HCC)  . Memory Loss    HPI Kristen [name] presents to the office today for follow-up of bipolar disorder and anxiety   07/16/20 appt with the following noted: Thinks topiramate makes her labile and impulsive in speech.  Not me at all.  Didn't start until taking that.  Taking it for migraine and tinnitus.  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  Balance issues. No tx options. Otherwise mood is stable. Staying up late.  Want to stay up all night. Mood lability and irritability and cycling into being too "brave". SX started when increased topiramate to current dose. OCD is a lot better. She thinks Wellbutrin helps depression.  Pleased  she's not depressed. Plan: Continue Trileptal to 900 mg dayly.     fluvaxamine 300 mg nightly. Continue Wellbutrin XL 150 every morning Reduce topiramate to 50 mg AM and 100 PM bc she felt it contributed to manic sx  09/29/2020 appointment with the following noted: Better with reduced topiramate with resolution of HA and manic subsided quite a bit. Not depressed.  Mood evened out. No SE meds. Needs refill fo Xanax and takes it nightly.  Mistake and received 0.25 instead of 0.5 mg tablets.  Not needed much daytime for anxiety. Patient reports stable mood and denies depressed or irritable moods.  Patient denies any recent difficulty with anxiety.  Patient denies difficulty with sleep initiation or maintenance. With meds 6-7 hours of sleep.  Denies appetite disturbance.  Patient reports that energy and motivation have been good.  Patient denies any difficulty with concentration.  Patient denies any suicidal ideation. Plan: No med changes  02/28/2021 appointment with the following noted: OK overall.  Some other issues from fall and consussion she had.  Including hearing affected.  Hearing aid.  Memory affected also by Covid per neuro.  Missed work for [employer].  Neuro took her out of work for a couple of months. Mood has been great and stable.   Past Psychiatric Medication Trials: Vraylar 3 restless, Latuda 80, oxcarbazine 900 SE,   risperidone nonresponse, lamotrigine 300, Seroquel weight gain, Abilify 10, lithium, oxcarbazepine 900,  topiramate  100 mg BID hypomania. pramipexole,  duloxetine, Lexapro, Wellbutrin, fluoxetine, venlafaxine, sertraline,  fluvoxamine 300 clonazepam, alprazolam, buspar NR  Review of Systems:  Review of Systems  Neurological: Negative for tremors, weakness and headaches.  Psychiatric/Behavioral: Positive for confusion and decreased concentration. Negative for dysphoric mood. The patient is not nervous/anxious.     Medications: I have reviewed the patient's  current medications.  Current Outpatient Medications  Medication Sig Dispense Refill  . levonorgestrel (MIRENA) 20 MCG/24HR IUD by Intrauterine route.    . rivaroxaban (XARELTO) 10 MG TABS tablet Take 1 tablet (10 mg total) by mouth daily. 30 tablet 12  . ALPRAZolam (XANAX) 0.5 MG tablet Take 1 tablet (0.5 mg total) by mouth 3 (three) times daily as needed for anxiety. 90 tablet 3  . buPROPion (WELLBUTRIN XL) 150 MG 24 hr tablet Take 1 tablet (150 mg total) by mouth every morning. 90 tablet 1  . fluvoxaMINE (LUVOX) 100 MG tablet Take 3 tablets (300 mg total) by mouth daily. 270 tablet 1  . lamoTRIgine (LAMICTAL) 200 MG tablet Take 1 tablet (200 mg total) by mouth daily. 90 tablet 1  . Oxcarbazepine (TRILEPTAL) 300 MG tablet TAKE ONE TABLET EVERY MORNING AND TAKE TWO TABLETS AT BEDTIME 270 tablet 1  . propranolol (INDERAL) 20 MG tablet TAKE 1 TO 2 TABLETS 3 TIMES DAILY AS NEEDED FOR RESTLESSNESS 180 tablet 1  . topiramate (TOPAMAX) 50 MG tablet TAKE 1 TABLET BY MOUTH EVERY MORNING AND2 TABLETS IN THE EVENING 270 tablet 1   No current facility-administered medications for this visit.    Medication Side Effects: None  Allergies:  Allergies  Allergen Reactions  . Codeine Nausea Only    Can tolerate medication when taken with food    Past Medical History:  Diagnosis Date  . Anemia   . Anxiety   . Bipolar disorder (HCC)   . Cholelithiasis   . DVT (deep venous thrombosis) (HCC)   . GERD (gastroesophageal reflux disease)   . Kidney stones   . Pulmonary embolism (HCC)   . RLS (restless legs syndrome)     Family History  Problem Relation Age of Onset  . Diabetes Father   . Stroke Father   . Heart attack Father   . Kidney failure Father   . Hypertension Father   . Uterine cancer Maternal Grandmother   . Ulcers Maternal Grandmother   . Diabetes Maternal Grandfather   . Stroke Maternal Grandfather   . Breast cancer Paternal Grandmother   . Colon cancer Paternal Grandfather   .  Hypertension Paternal Grandfather     Social History   Socioeconomic History  . Marital status: Married    Spouse name: Not on file  . Number of children: 3  . Years of education: Not on file  . Highest education level: Not on file  Occupational History  . Occupation: Software engineer: [employer]  Tobacco Use  . Smoking status: Never Smoker  . Smokeless tobacco: Never Used  Vaping Use  . Vaping Use: Never used  Substance and Sexual Activity  . Alcohol use: No    Alcohol/week: 0.0 standard drinks  . Drug use: No  . Sexual activity: Yes    Partners: Male    Birth control/protection: I.U.D.    Comment: mirena  Other Topics Concern  . Not on file  Social History Narrative  . Not on file   Social Determinants of Health   Financial Resource Strain: Not on file  Food Insecurity: Not on file  Transportation Needs: Not on file  Physical Activity:  Not on file  Stress: Not on file  Social Connections: Not on file  Intimate Partner Violence: Not on file    Past Medical History, Surgical history, Social history, and Family history were reviewed and updated as appropriate.   Please see review of systems for further details on the patient's review from today.   Objective:   Physical Exam:  There were no vitals taken for this visit.  Physical Exam Neurological:     Mental Status: She is alert and oriented to person, place, and time.     Cranial Nerves: No dysarthria.  Psychiatric:        Attention and Perception: Attention and perception normal.        Mood and Affect: Mood normal. Mood is not anxious or depressed.        Speech: Speech normal.        Behavior: Behavior is cooperative.        Thought Content: Thought content normal. Thought content is not paranoid or delusional. Thought content does not include homicidal or suicidal ideation. Thought content does not include homicidal or suicidal plan.        Cognition and Memory: Cognition  is impaired. She exhibits impaired recent memory.        Judgment: Judgment normal.     Comments: Insight intact Minimal anxiety      Lab Review:     Component Value Date/Time   NA 139 07/01/2018 1103   NA 140 08/15/2012 1624   K 3.8 07/01/2018 1103   K 3.6 08/15/2012 1624   CL 107 (H) 07/01/2018 1103   CL 106 08/15/2012 1624   CO2 21 07/01/2018 1103   CO2 26 08/15/2012 1624   GLUCOSE 88 07/01/2018 1103   GLUCOSE 153 (H) 03/05/2017 2142   GLUCOSE 83 08/15/2012 1624   BUN 8 07/01/2018 1103   BUN 10 08/15/2012 1624   CREATININE 0.56 (L) 07/01/2018 1103   CREATININE 0.55 (L) 08/15/2012 1624   CALCIUM 8.3 (L) 07/01/2018 1103   CALCIUM 9.0 08/15/2012 1624   PROT 5.6 (L) 07/01/2018 1103   PROT 6.5 08/15/2012 1624   ALBUMIN 3.4 (L) 07/01/2018 1103   ALBUMIN 3.4 08/15/2012 1624   AST 15 07/01/2018 1103   AST 22 08/15/2012 1624   ALT 12 07/01/2018 1103   ALT 23 08/15/2012 1624   ALKPHOS 69 07/01/2018 1103   ALKPHOS 98 08/15/2012 1624   BILITOT 0.5 07/01/2018 1103   BILITOT 0.8 08/15/2012 1624   GFRNONAA 112 07/01/2018 1103   GFRNONAA >60 08/15/2012 1624   GFRAA 129 07/01/2018 1103   GFRAA >60 08/15/2012 1624       Component Value Date/Time   WBC 7.5 10/08/2018 1603   WBC 6.9 12/21/2017 1505   RBC 4.68 10/08/2018 1603   RBC 4.76 12/21/2017 1505   HGB 14.2 10/08/2018 1603   HCT 43.4 10/08/2018 1603   PLT 371 10/08/2018 1603   MCV 93 10/08/2018 1603   MCV 91 08/15/2012 1624   MCH 30.3 10/08/2018 1603   MCH 31.1 12/21/2017 1505   MCHC 32.7 10/08/2018 1603   MCHC 33.4 12/21/2017 1505   RDW 12.9 10/08/2018 1603   RDW 15.7 (H) 08/15/2012 1624   LYMPHSABS 1.3 12/21/2017 1505   LYMPHSABS 1.5 10/08/2015 1107   LYMPHSABS 1.9 08/15/2012 1624   MONOABS 0.6 12/21/2017 1505   MONOABS 0.8 08/15/2012 1624   EOSABS 0.3 12/21/2017 1505   EOSABS 0.1 10/08/2015 1107   EOSABS 0.2 08/15/2012 1624   BASOSABS  0.1 12/21/2017 1505   BASOSABS 0.0 10/08/2015 1107   BASOSABS 0.0  08/15/2012 1624    No results found for: POCLITH, LITHIUM   No results found for: PHENYTOIN, PHENOBARB, VALPROATE, CBMZ   .res Assessment: Plan:    Jordain was seen today for follow-up, bipolar i disorder (hcc) and memory loss.  Diagnoses and all orders for this visit:  Bipolar I disorder (HCC) -     buPROPion (WELLBUTRIN XL) 150 MG 24 hr tablet; Take 1 tablet (150 mg total) by mouth every morning. -     lamoTRIgine (LAMICTAL) 200 MG tablet; Take 1 tablet (200 mg total) by mouth daily. -     Oxcarbazepine (TRILEPTAL) 300 MG tablet; TAKE ONE TABLET EVERY MORNING AND TAKE TWO TABLETS AT BEDTIME  Mixed obsessional thoughts and acts -     fluvoxaMINE (LUVOX) 100 MG tablet; Take 3 tablets (300 mg total) by mouth daily.  Generalized anxiety disorder -     propranolol (INDERAL) 20 MG tablet; TAKE 1 TO 2 TABLETS 3 TIMES DAILY AS NEEDED FOR RESTLESSNESS  Panic disorder with agoraphobia -     ALPRAZolam (XANAX) 0.5 MG tablet; Take 1 tablet (0.5 mg total) by mouth 3 (three) times daily as needed for anxiety.  Insomnia due to mental condition  Post concussion syndrome  Nightmare  Migraine without aura and without status migrainosus, not intractable -     topiramate (TOPAMAX) 50 MG tablet; TAKE 1 TABLET BY MOUTH EVERY MORNING AND2 TABLETS IN THE EVENING      .Greater than 50% of 30-minute face to face time with patient was spent on counseling and coordination of care. We discussed Good response and stability with psych meds.  However had to stop [name] which is working.  This is very unfortunate.  Historically she has relapse with symptoms whenever she has stopped [name].  Since stopping the [name] she is not had marked depression or mania but had unexpected recurrence of obsessive-compulsive symptoms.  She had these types of symptoms as a teenager but has not had severe symptoms as an adult and recent years.  The increase in Trileptal to 900 mg daily did not help the OCD. She is not  markedly depressed.  The increase in fluvoxamine did help with the OCD and she is tolerated it.  Discussed the risk that SSRIs can trigger mood swings and cause mania.   She is hypomanic and more labile than at the last visit she is rapid cycling but is not severe.  She attributes to this to the increase in Topamax because she said she did not have that symptom until the Topamax was increased.  Continue Trileptal to 900 mg dayly.    Informed her that we may have to go much higher and to particularly let us know if this insomnia persist.  Disc the off-label use of N-Acetylcysteine at 600 mg daily to help with mild cognitive problems.  It can be combined with a B-complex vitamin as the B-12 and folate have been shown to sometimes enhance the effect.  Discussed side effects of each med in detail.   fluvaxamine 300 mg nightly. Disc DDI with Xanax and use lower dosage.   Continue topiramate 50 mg in the morning and 100 mg in the evening.  The reduction resolved hypomania.  This is controlling headache  Continue Wellbutrin XL 150 mg every morning because it helped with depression and she is tolerating it well.  We discussed the short-term risks associated with benzodiazepines including sedation and  increased fall risk among others.  Discussed long-term side effect risk including dependence, potential withdrawal symptoms, and the potential eventual dose-related risk of dementia. Use LED especially in view of the effect of Luvox on the Xanax.  Follow-up 4-6 months or sooner as needed  Meredith Staggers MD, DFAPA  Please see After Visit Summary for patient specific instructions.  No future appointments.  No orders of the defined types were placed in this encounter.   -------------------------------

## 2021-05-04 ENCOUNTER — Other Ambulatory Visit: Payer: Self-pay | Admitting: Psychiatry

## 2021-05-04 DIAGNOSIS — F411 Generalized anxiety disorder: Secondary | ICD-10-CM

## 2021-07-23 ENCOUNTER — Other Ambulatory Visit: Payer: Self-pay | Admitting: Psychiatry

## 2021-07-23 DIAGNOSIS — F319 Bipolar disorder, unspecified: Secondary | ICD-10-CM

## 2021-08-27 ENCOUNTER — Other Ambulatory Visit: Payer: Self-pay | Admitting: Family Medicine

## 2021-08-27 NOTE — Telephone Encounter (Signed)
Requested medication (s) are due for refill today: yes  Requested medication (s) are on the active medication list: yes  Last refill:  08/26/20  Future visit scheduled: no  Notes to clinic:  called pt and LM on VM to call [facility]. Phone number provided.   Requested Prescriptions  Pending Prescriptions Disp Refills   XARELTO 10 MG TABS tablet [Pharmacy Med Name: XARELTO 10 MG TAB] 30 tablet 12    Sig: TAKE 1 TABLET BY MOUTH DAILY     Hematology: Anticoagulants - rivaroxaban Failed - 08/27/2021  9:13 AM      Failed - ALT in normal range and within 180 days    ALT  Date Value Ref Range Status  07/01/2018 12 0 - 32 IU/L Final   SGPT (ALT)  Date Value Ref Range Status  08/15/2012 23 12 - 78 U/L Final          Failed - AST in normal range and within 180 days    AST  Date Value Ref Range Status  07/01/2018 15 0 - 40 IU/L Final   SGOT(AST)  Date Value Ref Range Status  08/15/2012 22 15 - 37 Unit/L Final          Failed - Cr in normal range and within 360 days    Creatinine  Date Value Ref Range Status  08/15/2012 0.55 (L) 0.60 - 1.30 mg/dL Final   Creatinine, Ser  Date Value Ref Range Status  07/01/2018 0.56 (L) 0.57 - 1.00 mg/dL Final          Failed - HCT in normal range and within 360 days    Hematocrit  Date Value Ref Range Status  10/08/2018 43.4 34.0 - 46.6 % Final          Failed - HGB in normal range and within 360 days    Hemoglobin  Date Value Ref Range Status  10/08/2018 14.2 11.1 - 15.9 g/dL Final          Failed - PLT in normal range and within 360 days    Platelets  Date Value Ref Range Status  10/08/2018 371 150 - 450 x10E3/uL Final   Platelet Count, POC  Date Value Ref Range Status  08/21/2012 391 142 - 424 K/uL Final          Failed - Valid encounter within last 12 months    Recent Outpatient Visits           1 year ago Episodic paroxysmal hemicrania, not intractable   [facility] Malva Limes, [place]   2 years ago  Postconcussive syndrome   [facility] [place], [place]andra [place], [place]   2 years ago Photosensitivity   [facility] Malva Limes, [place]   2 years ago Persistent headaches   [facility] Malva Limes, [place]   2 years ago Tension headache   [facility] Malva Limes, [place]

## 2021-08-31 ENCOUNTER — Encounter: Payer: Self-pay | Admitting: Psychiatry

## 2021-08-31 ENCOUNTER — Ambulatory Visit (INDEPENDENT_AMBULATORY_CARE_PROVIDER_SITE_OTHER): Payer: 59 | Admitting: Psychiatry

## 2021-08-31 ENCOUNTER — Other Ambulatory Visit: Payer: Self-pay

## 2021-08-31 DIAGNOSIS — F319 Bipolar disorder, unspecified: Secondary | ICD-10-CM

## 2021-08-31 DIAGNOSIS — F4001 Agoraphobia with panic disorder: Secondary | ICD-10-CM

## 2021-08-31 DIAGNOSIS — F411 Generalized anxiety disorder: Secondary | ICD-10-CM

## 2021-08-31 DIAGNOSIS — F0781 Postconcussional syndrome: Secondary | ICD-10-CM

## 2021-08-31 DIAGNOSIS — R7989 Other specified abnormal findings of blood chemistry: Secondary | ICD-10-CM

## 2021-08-31 DIAGNOSIS — Z9884 Bariatric surgery status: Secondary | ICD-10-CM

## 2021-08-31 DIAGNOSIS — F5105 Insomnia due to other mental disorder: Secondary | ICD-10-CM

## 2021-08-31 DIAGNOSIS — E538 Deficiency of other specified B group vitamins: Secondary | ICD-10-CM

## 2021-08-31 DIAGNOSIS — G43009 Migraine without aura, not intractable, without status migrainosus: Secondary | ICD-10-CM

## 2021-08-31 DIAGNOSIS — G4752 REM sleep behavior disorder: Secondary | ICD-10-CM

## 2021-08-31 DIAGNOSIS — F422 Mixed obsessional thoughts and acts: Secondary | ICD-10-CM

## 2021-08-31 MED ORDER — OXCARBAZEPINE 300 MG PO TABS: ORAL_TABLET | ORAL | 1 refills | Status: DC

## 2021-08-31 MED ORDER — LAMOTRIGINE 200 MG PO TABS: 200.0000 mg | ORAL_TABLET | Freq: Every day | ORAL | 1 refills | Status: DC

## 2021-08-31 MED ORDER — TOPIRAMATE 50 MG PO TABS: ORAL_TABLET | ORAL | 1 refills | Status: DC

## 2021-08-31 MED ORDER — CLONAZEPAM 0.5 MG PO TABS: ORAL_TABLET | ORAL | 0 refills | Status: DC

## 2021-08-31 MED ORDER — FLUVOXAMINE MALEATE 100 MG PO TABS: 300.0000 mg | ORAL_TABLET | Freq: Every day | ORAL | 1 refills | Status: DC

## 2021-08-31 NOTE — Progress Notes (Signed)
Kristen [name] [id number] [birth year] 50 y.o.     Subjective:   Patient ID:  Kristen [name] is a 50 y.o. (DOB 02-28-1971) female.  Chief Complaint:  Chief Complaint  Patient presents with   Follow-up   Bipolar I disorder (HCC)   Anxiety   Sleeping Problem    HPI Kristen [name] presents to the office today for follow-up of bipolar disorder and anxiety   07/16/20 appt with the following noted: Thinks topiramate makes her labile and impulsive in speech.  Not me at all.  Didn't start until taking that.  Taking it for migraine and tinnitus.  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  Balance issues. No tx options. Otherwise mood is stable. Staying up late.  Want to stay up all night. Mood lability and irritability and cycling into being too "brave". SX started when increased topiramate to current dose. OCD is a lot better. She thinks Wellbutrin helps depression.  Pleased she's not depressed. Plan: Continue Trileptal to 900 mg dayly.     fluvaxamine 300 mg nightly. Continue Wellbutrin XL 150 every morning Reduce topiramate to 50 mg AM and 100 PM bc she felt it contributed to manic sx  09/29/2020 appointment with the following noted: Better with reduced topiramate with resolution of HA and manic subsided quite a bit. Not depressed.  Mood evened out. No SE meds. Needs refill fo Xanax and takes it nightly.  Mistake and received 0.25 instead of 0.5 mg tablets.  Not needed much daytime for anxiety. Patient reports stable mood and denies depressed or irritable moods.  Patient denies any recent difficulty with anxiety.  Patient denies difficulty with sleep initiation or maintenance. With meds 6-7 hours of sleep.  Denies appetite disturbance.  Patient reports that energy and motivation have been good.  Patient denies any difficulty with concentration.  Patient denies any suicidal ideation. Plan: No med changes  02/28/2021 appointment with the following noted: OK overall.   Some other issues from fall and consussion she had.  Including hearing affected.  Hearing aid.  Memory affected also by Covid per neuro.  Missed work for [employer].  Neuro took her out of work for a couple of months. Mood has been great and stable. Plan: No med changes  08/31/2021 appointment with the following noted: Hearing loss from concussion about 30% and has tinnitus.   Pretty good from mental health perspective.  No mood swings.  Not depressed.  Minimal OCD and anxiety.  No SE with meds. Sleep is weird, sort of sleep walk.  May or may not recognize it.  Talks in sleep.  May yell at [place].  Touches things beside her bed.  Happens 2-3 times per week. Has neuro but not seen since March. History of low B12 and was on shots. 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  Past Psychiatric Medication Trials: Vraylar 3 restless, Latuda 80, oxcarbazine 900 SE,   risperidone nonresponse, lamotrigine 300, Seroquel weight gain, Abilify 10, lithium, oxcarbazepine 900,  topiramate  100 mg BID hypomania.  pramipexole,  duloxetine, Lexapro, Wellbutrin, fluoxetine, venlafaxine, sertraline,  fluvoxamine 300 clonazepam, alprazolam, buspar NR  Review of Systems:  Review of Systems  Constitutional:  Positive for fatigue.  HENT:  Positive for hearing loss and tinnitus.   Neurological:  Negative for tremors, weakness and headaches.  Psychiatric/Behavioral:  Positive for confusion and decreased concentration. Negative for dysphoric mood. The patient is not nervous/anxious.    Medications: I have reviewed the patient's current medications.  Current  Outpatient Medications  Medication Sig Dispense Refill   ALPRAZolam (XANAX) 0.5 MG tablet Take 1 tablet (0.5 mg total) by mouth 3 (three) times daily as needed for anxiety. 90 tablet 3   buPROPion (WELLBUTRIN XL) 150 MG 24 hr tablet TAKE ONE TABLET (150 MG) BY MOUTH EVERY MORNING 90 tablet 1   clonazePAM (KLONOPIN) 0.5 MG tablet 1-2 tablets at night for sleep disorder 30  tablet 0   levonorgestrel (MIRENA) 20 MCG/24HR IUD by Intrauterine route.     propranolol (INDERAL) 20 MG tablet TAKE ONE TO TWO TABLETS BY MOUTH 3 TIMESDAILY AS NEEDED FOR RESTLESSNESS 180 tablet 1   XARELTO 10 MG TABS tablet TAKE 1 TABLET BY MOUTH DAILY 30 tablet 0   fluvoxaMINE (LUVOX) 100 MG tablet Take 3 tablets (300 mg total) by mouth daily. 270 tablet 1   lamoTRIgine (LAMICTAL) 200 MG tablet Take 1 tablet (200 mg total) by mouth daily. 90 tablet 1   Oxcarbazepine (TRILEPTAL) 300 MG tablet TAKE ONE TABLET EVERY MORNING AND TAKE TWO TABLETS AT BEDTIME 270 tablet 1   topiramate (TOPAMAX) 50 MG tablet TAKE 1 TABLET BY MOUTH EVERY MORNING AND2 TABLETS IN THE EVENING 270 tablet 1   No current facility-administered medications for this visit.    Medication Side Effects: None  Allergies:  Allergies  Allergen Reactions   Codeine Nausea Only    Can tolerate medication when taken with food    Past Medical History:  Diagnosis Date   Anemia    Anxiety    Bipolar disorder (HCC)    Cholelithiasis    DVT (deep venous thrombosis) (HCC)    GERD (gastroesophageal reflux disease)    Kidney stones    Pulmonary embolism (HCC)    RLS (restless legs syndrome)     Family History  Problem Relation Age of Onset   Diabetes Father    Stroke Father    Heart attack Father    Kidney failure Father    Hypertension Father    Uterine cancer Maternal Grandmother    Ulcers Maternal Grandmother    Diabetes Maternal Grandfather    Stroke Maternal Grandfather    Breast cancer Paternal Grandmother    Colon cancer Paternal Grandfather    Hypertension Paternal Grandfather     Social History   Socioeconomic History   Marital status: Married    Spouse name: Not on file   Number of children: 3   Years of education: Not on file   Highest education level: Not on file  Occupational History   Occupation: Architectural technologist    Employer: [employer]  Tobacco Use   Smoking status:  Never   Smokeless tobacco: Never  Vaping Use   Vaping Use: Never used  Substance and Sexual Activity   Alcohol use: No    Alcohol/week: 0.0 standard drinks   Drug use: No   Sexual activity: Yes    Partners: Male    Birth control/protection: I.U.D.    Comment: mirena  Other Topics Concern   Not on file  Social History Narrative   Not on file   Social Determinants of Health   Financial Resource Strain: Not on file  Food Insecurity: Not on file  Transportation Needs: Not on file  Physical Activity: Not on file  Stress: Not on file  Social Connections: Not on file  Intimate Partner Violence: Not on file    Past Medical History, Surgical history, Social history, and Family history were reviewed and updated as appropriate.  Please see review of systems for further details on the patient's review from today.   Objective:   Physical Exam:  There were no vitals taken for this visit.  Physical Exam Constitutional:      General: She is not in acute distress. Musculoskeletal:        General: No deformity.  Neurological:     Mental Status: She is alert and oriented to person, place, and time.     Cranial Nerves: No dysarthria.     Coordination: Coordination normal.  Psychiatric:        Attention and Perception: Attention and perception normal. She does not perceive auditory or visual hallucinations.        Mood and Affect: Mood normal. Mood is not anxious or depressed. Affect is not labile, blunt, angry or inappropriate.        Speech: Speech normal.        Behavior: Behavior normal. Behavior is cooperative.        Thought Content: Thought content normal. Thought content is not paranoid or delusional. Thought content does not include homicidal or suicidal ideation. Thought content does not include homicidal or suicidal plan.        Cognition and Memory: Cognition is impaired. She exhibits impaired recent memory.        Judgment: Judgment normal.     Comments: Insight  intact Minimal anxiety     Lab Review:     Component Value Date/Time   NA 139 07/01/2018 1103   NA 140 08/15/2012 1624   K 3.8 07/01/2018 1103   K 3.6 08/15/2012 1624   CL 107 (H) 07/01/2018 1103   CL 106 08/15/2012 1624   CO2 21 07/01/2018 1103   CO2 26 08/15/2012 1624   GLUCOSE 88 07/01/2018 1103   GLUCOSE 153 (H) 03/05/2017 2142   GLUCOSE 83 08/15/2012 1624   BUN 8 07/01/2018 1103   BUN 10 08/15/2012 1624   CREATININE 0.56 (L) 07/01/2018 1103   CREATININE 0.55 (L) 08/15/2012 1624   CALCIUM 8.3 (L) 07/01/2018 1103   CALCIUM 9.0 08/15/2012 1624   PROT 5.6 (L) 07/01/2018 1103   PROT 6.5 08/15/2012 1624   ALBUMIN 3.4 (L) 07/01/2018 1103   ALBUMIN 3.4 08/15/2012 1624   AST 15 07/01/2018 1103   AST 22 08/15/2012 1624   ALT 12 07/01/2018 1103   ALT 23 08/15/2012 1624   ALKPHOS 69 07/01/2018 1103   ALKPHOS 98 08/15/2012 1624   BILITOT 0.5 07/01/2018 1103   BILITOT 0.8 08/15/2012 1624   GFRNONAA 112 07/01/2018 1103   GFRNONAA >60 08/15/2012 1624   GFRAA 129 07/01/2018 1103   GFRAA >60 08/15/2012 1624       Component Value Date/Time   WBC 7.5 10/08/2018 1603   WBC 6.9 12/21/2017 1505   RBC 4.68 10/08/2018 1603   RBC 4.76 12/21/2017 1505   HGB 14.2 10/08/2018 1603   HCT 43.4 10/08/2018 1603   PLT 371 10/08/2018 1603   MCV 93 10/08/2018 1603   MCV 91 08/15/2012 1624   MCH 30.3 10/08/2018 1603   MCH 31.1 12/21/2017 1505   MCHC 32.7 10/08/2018 1603   MCHC 33.4 12/21/2017 1505   RDW 12.9 10/08/2018 1603   RDW 15.7 (H) 08/15/2012 1624   LYMPHSABS 1.3 12/21/2017 1505   LYMPHSABS 1.5 10/08/2015 1107   LYMPHSABS 1.9 08/15/2012 1624   MONOABS 0.6 12/21/2017 1505   MONOABS 0.8 08/15/2012 1624   EOSABS 0.3 12/21/2017 1505   EOSABS 0.1 10/08/2015 1107  EOSABS 0.2 08/15/2012 1624   BASOSABS 0.1 12/21/2017 1505   BASOSABS 0.0 10/08/2015 1107   BASOSABS 0.0 08/15/2012 1624    No results found for: POCLITH, LITHIUM   No results found for: PHENYTOIN, PHENOBARB,  VALPROATE, CBMZ   .res Assessment: Plan:    Jasiah was seen today for follow-up, bipolar i disorder (hcc), anxiety and sleeping problem.  Diagnoses and all orders for this visit:  Bipolar I disorder (HCC) -     lamoTRIgine (LAMICTAL) 200 MG tablet; Take 1 tablet (200 mg total) by mouth daily. -     Oxcarbazepine (TRILEPTAL) 300 MG tablet; TAKE ONE TABLET EVERY MORNING AND TAKE TWO TABLETS AT BEDTIME  Mixed obsessional thoughts and acts -     fluvoxaMINE (LUVOX) 100 MG tablet; Take 3 tablets (300 mg total) by mouth daily.  Generalized anxiety disorder  Panic disorder with agoraphobia  Insomnia due to mental condition  Post concussion syndrome  Low serum vitamin B12 -     Vitamin B12  Low vitamin D level -     VITAMIN D 25 Hydroxy (Vit-D Deficiency, Fractures)  Status following gastric bypass for weight loss -     VITAMIN D 25 Hydroxy (Vit-D Deficiency, Fractures) -     Vitamin B12  REM sleep behavior disorder -     clonazePAM (KLONOPIN) 0.5 MG tablet; 1-2 tablets at night for sleep disorder  Migraine without aura and without status migrainosus, not intractable -     topiramate (TOPAMAX) 50 MG tablet; TAKE 1 TABLET BY MOUTH EVERY MORNING AND2 TABLETS IN THE EVENING     .Greater than 50% of 30-minute face to face time with patient was spent on counseling and coordination of care. We discussed Good response and stability with psych meds.  However had to stop [name] which is working.  This is very unfortunate.  Historically she has relapse with symptoms whenever she has stopped [name].  However she has been stable since last visit with mood.  Disc dx RBSD.  TX of choice is clonazepam 0.5-1.0 mg HS   Since stopping the Latuda she is not had marked depression or mania but had unexpected recurrence of obsessive-compulsive symptoms.  She had these types of symptoms as a teenager but has not had severe symptoms as an adult and recent years.  The increase in Trileptal to 900 mg  daily did not help the OCD. She is not markedly depressed.  The increase in fluvoxamine did help with the OCD and she is tolerated it.  Discussed the risk that SSRIs can trigger mood swings and cause mania.   She is hypomanic and more labile than at the last visit she is rapid cycling but is not severe.  She attributes to this to the increase in Topamax because she said she did not have that symptom until the Topamax was increased.  History of gastric bypass and secondary history low B12 She is taking oral B12 but at risk of malabsorptiion.  Check level and D   Continue Trileptal to 900 mg dayly.    Informed her that we may have to go much higher and to particularly let us know if this insomnia persist.  Disc the off-label use of N-Acetylcysteine at 600 mg daily to help with mild cognitive problems.  It can be combined with a B-complex vitamin as the B-12 and folate have been shown to sometimes enhance the effect.   Discussed side effects of each med in detail.    fluvoxamine 300 mg  nightly. Disc DDI with Xanax and use lower dosage.    Continue topiramate 50 mg in the morning and 100 mg in the evening.  The reduction resolved hypomania.  This is controlling headache  Continue Wellbutrin XL 150 mg every morning because it helped with depression and she is tolerating it well.   We discussed the short-term risks associated with benzodiazepines including sedation and increased fall risk among others.  Discussed long-term side effect risk including dependence, potential withdrawal symptoms, and the potential eventual dose-related risk of dementia. Use LED especially in view of the effect of Luvox on the Xanax.  Follow-up 2 months or sooner as needed  Meredith Staggers MD, DFAPA  Please see After Visit Summary for patient specific instructions.  Future Appointments  Date Time Provider Department Center  11/09/2021  4:00 PM Cottle, Steva Ready., MD [facility] None    Orders Placed This Encounter   Procedures   VITAMIN D 25 Hydroxy (Vit-D Deficiency, Fractures)   Vitamin B12     -------------------------------

## 2021-10-29 ENCOUNTER — Other Ambulatory Visit: Payer: Self-pay | Admitting: Family Medicine

## 2021-10-29 NOTE — Telephone Encounter (Signed)
Requested medication (s) are due for refill today: Yes  Requested medication (s) are on the active medication list: Yes  Last refill:  08/28/21  Future visit scheduled: no  Notes to clinic:  Unable to refill per protocol, appointment needed. Pt called and left VM to call and schedule appt. Courtesy refill already given.     Requested Prescriptions  Pending Prescriptions Disp Refills   XARELTO 10 MG TABS tablet [Pharmacy Med Name: XARELTO 10 MG TAB] 30 tablet 0    Sig: TAKE ONE TABLET BY MOUTH EVERY DAY     Hematology: Anticoagulants - rivaroxaban Failed - 10/29/2021 10:01 AM      Failed - ALT in normal range and within 180 days    ALT  Date Value Ref Range Status  07/01/2018 12 0 - 32 IU/L Final   SGPT (ALT)  Date Value Ref Range Status  08/15/2012 23 12 - 78 U/L Final          Failed - AST in normal range and within 180 days    AST  Date Value Ref Range Status  07/01/2018 15 0 - 40 IU/L Final   SGOT(AST)  Date Value Ref Range Status  08/15/2012 22 15 - 37 Unit/L Final          Failed - Cr in normal range and within 360 days    Creatinine  Date Value Ref Range Status  08/15/2012 0.55 (L) 0.60 - 1.30 mg/dL Final   Creatinine, Ser  Date Value Ref Range Status  07/01/2018 0.56 (L) 0.57 - 1.00 mg/dL Final          Failed - HCT in normal range and within 360 days    Hematocrit  Date Value Ref Range Status  10/08/2018 43.4 34.0 - 46.6 % Final          Failed - HGB in normal range and within 360 days    Hemoglobin  Date Value Ref Range Status  10/08/2018 14.2 11.1 - 15.9 g/dL Final          Failed - PLT in normal range and within 360 days    Platelets  Date Value Ref Range Status  10/08/2018 371 150 - 450 x10E3/uL Final   Platelet Count, POC  Date Value Ref Range Status  08/21/2012 391 142 - 424 K/uL Final          Failed - Valid encounter within last 12 months    Recent Outpatient Visits           1 year ago Episodic paroxysmal hemicrania, not  intractable   [facility] Malva Limes, MD   2 years ago Postconcussive syndrome   [facility] [place], [place], [place]   2 years ago Photosensitivity   [facility] Malva Limes, MD   2 years ago Persistent headaches   [facility] Malva Limes, MD   2 years ago Tension headache   [facility] Malva Limes, MD

## 2021-10-29 NOTE — Telephone Encounter (Signed)
Patient called, left VM to return the call to the office to scheduled an appt for medication refill request.   

## 2021-10-31 ENCOUNTER — Other Ambulatory Visit: Payer: Self-pay | Admitting: Family Medicine

## 2021-10-31 NOTE — Telephone Encounter (Signed)
Patient has apt ow, so can go ahead and get refill of XARELTO 10 MG TABS tablet [facility] - [place], [place] - [address]  [address] [place], [place] [address]  Phone:  [phone]  Fax:  [phone]

## 2021-11-01 ENCOUNTER — Other Ambulatory Visit: Payer: Self-pay | Admitting: Psychiatry

## 2021-11-01 DIAGNOSIS — F411 Generalized anxiety disorder: Secondary | ICD-10-CM

## 2021-11-01 NOTE — Telephone Encounter (Signed)
Duplicate request- filled by provider 10/31/21 Requested Prescriptions  Pending Prescriptions Disp Refills  . rivaroxaban (XARELTO) 10 MG TABS tablet 30 tablet 0    Sig: Take 1 tablet (10 mg total) by mouth daily.     Hematology: Anticoagulants - rivaroxaban Failed - 10/31/2021  5:14 PM      Failed - ALT in normal range and within 180 days    ALT  Date Value Ref Range Status  07/01/2018 12 0 - 32 IU/L Final   SGPT (ALT)  Date Value Ref Range Status  08/15/2012 23 12 - 78 U/L Final         Failed - AST in normal range and within 180 days    AST  Date Value Ref Range Status  07/01/2018 15 0 - 40 IU/L Final   SGOT(AST)  Date Value Ref Range Status  08/15/2012 22 15 - 37 Unit/L Final         Failed - Cr in normal range and within 360 days    Creatinine  Date Value Ref Range Status  08/15/2012 0.55 (L) 0.60 - 1.30 mg/dL Final   Creatinine, Ser  Date Value Ref Range Status  07/01/2018 0.56 (L) 0.57 - 1.00 mg/dL Final         Failed - HCT in normal range and within 360 days    Hematocrit  Date Value Ref Range Status  10/08/2018 43.4 34.0 - 46.6 % Final         Failed - HGB in normal range and within 360 days    Hemoglobin  Date Value Ref Range Status  10/08/2018 14.2 11.1 - 15.9 g/dL Final         Failed - PLT in normal range and within 360 days    Platelets  Date Value Ref Range Status  10/08/2018 371 150 - 450 x10E3/uL Final   Platelet Count, POC  Date Value Ref Range Status  08/21/2012 391 142 - 424 K/uL Final         Failed - Valid encounter within last 12 months    Recent Outpatient Visits          1 year ago Episodic paroxysmal hemicrania, not intractable   [facility] Malva Limes, MD   2 years ago Postconcussive syndrome   [facility] [place], [place], [place]   2 years ago Photosensitivity   [facility] Malva Limes, MD   2 years ago Persistent headaches   [facility] Malva Limes, MD   2 years ago Tension headache   [facility] Malva Limes, MD      Future Appointments            In 1 month Fisher, Demetrios Isaacs, MD [facility]

## 2021-11-02 LAB — VITAMIN B12: Vitamin B-12: 295 pg/mL (ref 200–1100)

## 2021-11-02 LAB — VITAMIN D 25 HYDROXY (VIT D DEFICIENCY, FRACTURES): Vit D, 25-Hydroxy: 16 ng/mL — ABNORMAL LOW (ref 30–100)

## 2021-11-09 ENCOUNTER — Ambulatory Visit: Payer: 59 | Admitting: Psychiatry

## 2021-11-09 ENCOUNTER — Other Ambulatory Visit: Payer: Self-pay | Admitting: Psychiatry

## 2021-11-09 DIAGNOSIS — R7989 Other specified abnormal findings of blood chemistry: Secondary | ICD-10-CM

## 2021-11-09 DIAGNOSIS — E538 Deficiency of other specified B group vitamins: Secondary | ICD-10-CM

## 2021-11-09 MED ORDER — CYANOCOBALAMIN 500 MCG PO TABS: 500.0000 ug | ORAL_TABLET | Freq: Every day | ORAL | 1 refills | Status: DC

## 2021-11-09 MED ORDER — VITAMIN D (ERGOCALCIFEROL) 1.25 MG (50000 UNIT) PO CAPS: 50000.0000 [IU] | ORAL_CAPSULE | ORAL | 3 refills | Status: DC

## 2021-11-09 NOTE — Progress Notes (Signed)
Her vitamin D level is much too low and has been historically too low.  That can contribute to fatigue and depression and cognitive problems.  I sent in a prescription for once a week vitamin D and high dose.  Her B12 levels have historically been low as well and are currently borderline.  I sent in a prescription for vitamin B12 as well and that may help her energy, concentration, and mood.  Please call and give her this information.

## 2021-11-09 NOTE — Progress Notes (Signed)
Results and recommendations given to the patient.

## 2021-11-30 ENCOUNTER — Other Ambulatory Visit: Payer: Self-pay | Admitting: Family Medicine

## 2021-11-30 NOTE — Telephone Encounter (Signed)
Requested medications are due for refill today.  yes  Requested medications are on the active medications list.  yes  Last refill. 10/31/2021  Future visit scheduled.   yes  Notes to clinic.  Pt already given a courtesy refill. Labs are expired.    Requested Prescriptions  Pending Prescriptions Disp Refills   XARELTO 10 MG TABS tablet [Pharmacy Med Name: XARELTO 10 MG TAB] 30 tablet 0    Sig: TAKE ONE TABLET BY MOUTH EVERY DAY     Hematology: Anticoagulants - rivaroxaban Failed - 11/30/2021  2:52 AM      Failed - ALT in normal range and within 180 days    ALT  Date Value Ref Range Status  07/01/2018 12 0 - 32 IU/L Final   SGPT (ALT)  Date Value Ref Range Status  08/15/2012 23 12 - 78 U/L Final          Failed - AST in normal range and within 180 days    AST  Date Value Ref Range Status  07/01/2018 15 0 - 40 IU/L Final   SGOT(AST)  Date Value Ref Range Status  08/15/2012 22 15 - 37 Unit/L Final          Failed - Cr in normal range and within 360 days    Creatinine  Date Value Ref Range Status  08/15/2012 0.55 (L) 0.60 - 1.30 mg/dL Final   Creatinine, Ser  Date Value Ref Range Status  07/01/2018 0.56 (L) 0.57 - 1.00 mg/dL Final          Failed - HCT in normal range and within 360 days    Hematocrit  Date Value Ref Range Status  10/08/2018 43.4 34.0 - 46.6 % Final          Failed - HGB in normal range and within 360 days    Hemoglobin  Date Value Ref Range Status  10/08/2018 14.2 11.1 - 15.9 g/dL Final          Failed - PLT in normal range and within 360 days    Platelets  Date Value Ref Range Status  10/08/2018 371 150 - 450 x10E3/uL Final   Platelet Count, POC  Date Value Ref Range Status  08/21/2012 391 142 - 424 K/uL Final          Failed - Valid encounter within last 12 months    Recent Outpatient Visits           1 year ago Episodic paroxysmal hemicrania, not intractable   [facility] Malva Limes, MD   2 years  ago Postconcussive syndrome   [facility] [place], [place], [place]   2 years ago Photosensitivity   [facility] Malva Limes, MD   2 years ago Persistent headaches   [facility] Malva Limes, MD   2 years ago Tension headache   [facility] Malva Limes, MD       Future Appointments             In 6 days Fisher, Demetrios Isaacs, MD [facility]

## 2021-12-06 ENCOUNTER — Ambulatory Visit: Payer: 59 | Admitting: Family Medicine

## 2021-12-06 NOTE — Progress Notes (Deleted)
°  ° ° °  Established patient visit   Patient: Kristen [name]   DOB: 02/12/1971   50 y.o. Female  MRN: [id number] Visit Date: 12/06/2021  Today's healthcare provider: Mila Merry, MD   No chief complaint on file.  Subjective    HPI  Follow up for history of DVT:  The patient was last seen for this more than 1 year ago.   Changes made at last visit include none. Current treatment includes Xarelto 10mg  daily.  She reports {excellent/good/fair/poor:19665} compliance with treatment. She feels that condition is {improved/worse/unchanged:3041574}. She {is/is not:21021397} having side effects. ***  -----------------------------------------------------------------------------------------   {Link to patient history deactivated due to formatting error:1}  Medications: Outpatient Medications Prior to Visit  Medication Sig   ALPRAZolam (XANAX) 0.5 MG tablet Take 1 tablet (0.5 mg total) by mouth 3 (three) times daily as needed for anxiety.   buPROPion (WELLBUTRIN XL) 150 MG 24 hr tablet TAKE ONE TABLET (150 MG) BY MOUTH EVERY MORNING   clonazePAM (KLONOPIN) 0.5 MG tablet 1-2 tablets at night for sleep disorder   fluvoxaMINE (LUVOX) 100 MG tablet Take 3 tablets (300 mg total) by mouth daily.   lamoTRIgine (LAMICTAL) 200 MG tablet Take 1 tablet (200 mg total) by mouth daily.   levonorgestrel (MIRENA) 20 MCG/24HR IUD by Intrauterine route.   Oxcarbazepine (TRILEPTAL) 300 MG tablet TAKE ONE TABLET EVERY MORNING AND TAKE TWO TABLETS AT BEDTIME   propranolol (INDERAL) 20 MG tablet TAKE ONE TO TWO TABLETS BY MOUTH 3 TIMESDAILY AS NEEDED FOR RESTLESSNESS   rivaroxaban (XARELTO) 10 MG TABS tablet Take 1 tablet (10 mg total) by mouth daily. PATIENT NEEDS OFFICE VISIT FOR FOLLOW UP   topiramate (TOPAMAX) 50 MG tablet TAKE 1 TABLET BY MOUTH EVERY MORNING AND2 TABLETS IN THE EVENING   vitamin B-12 (CYANOCOBALAMIN) 500 MCG tablet Take 1 tablet (500 mcg total) by mouth daily.   Vitamin D, Ergocalciferol,  (DRISDOL) 1.25 MG (50000 UNIT) CAPS capsule Take 1 capsule (50,000 Units total) by mouth every 7 (seven) days.   No facility-administered medications prior to visit.    Review of Systems  {Labs   Heme   Chem   Endocrine   Serology   Results Review (optional):23779}   Objective    There were no vitals taken for this visit. {Show previous vital signs (optional):23777}  Physical Exam  ***  No results found for any visits on 12/06/21.  Assessment & Plan     ***  No follow-ups on file.      {provider attestation***:1}   12/08/21, MD  [facility] [phone] (phone) [phone] (fax)  [facility]

## 2021-12-08 ENCOUNTER — Ambulatory Visit (INDEPENDENT_AMBULATORY_CARE_PROVIDER_SITE_OTHER): Payer: 59 | Admitting: Psychiatry

## 2021-12-08 ENCOUNTER — Encounter: Payer: Self-pay | Admitting: Psychiatry

## 2021-12-08 ENCOUNTER — Other Ambulatory Visit: Payer: Self-pay

## 2021-12-08 DIAGNOSIS — F422 Mixed obsessional thoughts and acts: Secondary | ICD-10-CM | POA: Diagnosis not present

## 2021-12-08 DIAGNOSIS — F319 Bipolar disorder, unspecified: Secondary | ICD-10-CM | POA: Diagnosis not present

## 2021-12-08 DIAGNOSIS — F4001 Agoraphobia with panic disorder: Secondary | ICD-10-CM | POA: Diagnosis not present

## 2021-12-08 DIAGNOSIS — F5105 Insomnia due to other mental disorder: Secondary | ICD-10-CM

## 2021-12-08 DIAGNOSIS — G4752 REM sleep behavior disorder: Secondary | ICD-10-CM

## 2021-12-08 DIAGNOSIS — R7989 Other specified abnormal findings of blood chemistry: Secondary | ICD-10-CM

## 2021-12-08 DIAGNOSIS — F411 Generalized anxiety disorder: Secondary | ICD-10-CM

## 2021-12-08 NOTE — Progress Notes (Signed)
Kristen [name] [id number] 1971/07/02 50 y.o.     Subjective:   Patient ID:  Kristen [name] is a 50 y.o. (DOB 01-Nov-1971) female.  Chief Complaint:  Chief Complaint  Patient presents with   Follow-up    Bipolar I disorder (HCC)    Fatigue    HPI Kristen [name] presents to the office today for follow-up of bipolar disorder and anxiety   07/16/20 appt with the following noted: Thinks topiramate makes her labile and impulsive in speech.  Not me at all.  Didn't start until taking that.  Taking it for migraine and tinnitus.  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  Balance issues. No tx options. Otherwise mood is stable. Staying up late.  Want to stay up all night. Mood lability and irritability and cycling into being too "brave". SX started when increased topiramate to current dose. OCD is a lot better. She thinks Wellbutrin helps depression.  Pleased she's not depressed. Plan: Continue Trileptal to 900 mg dayly.     fluvaxamine 300 mg nightly. Continue Wellbutrin XL 150 every morning Reduce topiramate to 50 mg AM and 100 PM bc she felt it contributed to manic sx  09/29/2020 appointment with the following noted: Better with reduced topiramate with resolution of HA and manic subsided quite a bit. Not depressed.  Mood evened out. No SE meds. Needs refill fo Xanax and takes it nightly.  Mistake and received 0.25 instead of 0.5 mg tablets.  Not needed much daytime for anxiety. Patient reports stable mood and denies depressed or irritable moods.  Patient denies any recent difficulty with anxiety.  Patient denies difficulty with sleep initiation or maintenance. With meds 6-7 hours of sleep.  Denies appetite disturbance.  Patient reports that energy and motivation have been good.  Patient denies any difficulty with concentration.  Patient denies any suicidal ideation. Plan: No med changes  02/28/2021 appointment with the following noted: OK overall.  Some other issues  from fall and consussion she had.  Including hearing affected.  Hearing aid.  Memory affected also by Covid per neuro.  Missed work for [employer].  Neuro took her out of work for a couple of months. Mood has been great and stable. Plan: No med changes  08/31/2021 appointment with the following noted: Hearing loss from concussion about 30% and has tinnitus.   Pretty good from mental health perspective.  No mood swings.  Not depressed.  Minimal OCD and anxiety.  No SE with meds. Sleep is weird, sort of sleep walk.  May or may not recognize it.  Talks in sleep.  May yell at [place].  Touches things beside her bed.  Happens 2-3 times per week. Has neuro but not seen since March. History of low B12 and was on shots. 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  08/31/21 TC to pt: Her vitamin D level is much too low and has been historically too low.  That can contribute to fatigue and depression and cognitive problems.  I sent in a prescription for once a week vitamin D and high dose. Her B12 levels have historically been low as well and are currently borderline.  I sent in a prescription for vitamin B12 as well and that may help her energy, concentration, and mood. Please call and give her this information.  12/08/2021 appointment noted: Started B12 and vitamin D.   Overall doing ok.  A little bit of benefit noted with vitamins.  Severe osteoporsis in family. No SE with  meds. Patient reports stable mood and denies depressed or irritable moods.  Patient mild recent difficulty with anxiety.  Patient denies difficulty with sleep initiation or maintenance. Denies appetite disturbance.  Patient reports that energy and motivation have been good.  Patient denies any difficulty with concentration.  Patient denies any suicidal ideation. Bad NM once every 2-3 weeks and less than it was. 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  Past Psychiatric Medication Trials: Vraylar 3 restless, Latuda 80, oxcarbazine 900 SE,   risperidone  nonresponse, lamotrigine 300, Seroquel weight gain, Abilify 10, lithium, oxcarbazepine 900,  topiramate  100 mg BID hypomania.  pramipexole,  duloxetine, Lexapro, Wellbutrin, fluoxetine, venlafaxine, sertraline,  fluvoxamine 300 clonazepam, alprazolam, buspar NR  Review of Systems:  Review of Systems  Constitutional:  Positive for fatigue.  HENT:  Positive for hearing loss and tinnitus.   Neurological:  Negative for tremors, weakness and headaches.  Psychiatric/Behavioral:  Positive for decreased concentration. Negative for confusion and dysphoric mood. The patient is not nervous/anxious.    Medications: I have reviewed the patient's current medications.  Current Outpatient Medications  Medication Sig Dispense Refill   buPROPion (WELLBUTRIN XL) 150 MG 24 hr tablet TAKE ONE TABLET (150 MG) BY MOUTH EVERY MORNING 90 tablet 1   clonazePAM (KLONOPIN) 0.5 MG tablet 1-2 tablets at night for sleep disorder 30 tablet 0   fluvoxaMINE (LUVOX) 100 MG tablet Take 3 tablets (300 mg total) by mouth daily. 270 tablet 1   lamoTRIgine (LAMICTAL) 200 MG tablet Take 1 tablet (200 mg total) by mouth daily. 90 tablet 1   levonorgestrel (MIRENA) 20 MCG/24HR IUD by Intrauterine route.     Oxcarbazepine (TRILEPTAL) 300 MG tablet TAKE ONE TABLET EVERY MORNING AND TAKE TWO TABLETS AT BEDTIME 270 tablet 1   propranolol (INDERAL) 20 MG tablet TAKE ONE TO TWO TABLETS BY MOUTH 3 TIMESDAILY AS NEEDED FOR RESTLESSNESS 150 tablet 0   rivaroxaban (XARELTO) 10 MG TABS tablet Take 1 tablet (10 mg total) by mouth daily. PATIENT NEEDS OFFICE VISIT FOR FOLLOW UP 30 tablet 0   topiramate (TOPAMAX) 50 MG tablet TAKE 1 TABLET BY MOUTH EVERY MORNING AND2 TABLETS IN THE EVENING 270 tablet 1   vitamin B-12 (CYANOCOBALAMIN) 500 MCG tablet Take 1 tablet (500 mcg total) by mouth daily. 90 tablet 1   Vitamin D, Ergocalciferol, (DRISDOL) 1.25 MG (50000 UNIT) CAPS capsule Take 1 capsule (50,000 Units total) by mouth every 7 (seven) days.  15 capsule 3   ALPRAZolam (XANAX) 0.5 MG tablet Take 1 tablet (0.5 mg total) by mouth 3 (three) times daily as needed for anxiety. (Patient not taking: Reported on 12/08/2021) 90 tablet 3   No current facility-administered medications for this visit.    Medication Side Effects: None  Allergies:  Allergies  Allergen Reactions   Codeine Nausea Only    Can tolerate medication when taken with food    Past Medical History:  Diagnosis Date   Anemia    Anxiety    Bipolar disorder (HCC)    Cholelithiasis    DVT (deep venous thrombosis) (HCC)    GERD (gastroesophageal reflux disease)    Kidney stones    Pulmonary embolism (HCC)    RLS (restless legs syndrome)     Family History  Problem Relation Age of Onset   Diabetes Father    Stroke Father    Heart attack Father    Kidney failure Father    Hypertension Father    Uterine cancer Maternal Grandmother    Ulcers Maternal Grandmother  Diabetes Maternal Grandfather    Stroke Maternal Grandfather    Breast cancer Paternal Grandmother    Colon cancer Paternal Grandfather    Hypertension Paternal Grandfather     Social History   Socioeconomic History   Marital status: Married    Spouse name: Not on file   Number of children: 3   Years of education: Not on file   Highest education level: Not on file  Occupational History   Occupation: Architectural technologist    Employer: [employer]  Tobacco Use   Smoking status: Never   Smokeless tobacco: Never  Vaping Use   Vaping Use: Never used  Substance and Sexual Activity   Alcohol use: No    Alcohol/week: 0.0 standard drinks   Drug use: No   Sexual activity: Yes    Partners: Male    Birth control/protection: I.U.D.    Comment: mirena  Other Topics Concern   Not on file  Social History Narrative   Not on file   Social Determinants of Health   Financial Resource Strain: Not on file  Food Insecurity: Not on file  Transportation Needs: Not on file  Physical  Activity: Not on file  Stress: Not on file  Social Connections: Not on file  Intimate Partner Violence: Not on file    Past Medical History, Surgical history, Social history, and Family history were reviewed and updated as appropriate.   Please see review of systems for further details on the patient's review from today.   Objective:   Physical Exam:  There were no vitals taken for this visit.  Physical Exam Constitutional:      General: She is not in acute distress. Musculoskeletal:        General: No deformity.  Neurological:     Mental Status: She is alert and oriented to person, place, and time.     Cranial Nerves: No dysarthria.     Coordination: Coordination normal.  Psychiatric:        Attention and Perception: Attention and perception normal. She does not perceive auditory or visual hallucinations.        Mood and Affect: Mood normal. Mood is not anxious or depressed. Affect is not labile, blunt, angry or inappropriate.        Speech: Speech normal.        Behavior: Behavior normal. Behavior is cooperative.        Thought Content: Thought content normal. Thought content is not paranoid or delusional. Thought content does not include homicidal or suicidal ideation. Thought content does not include homicidal or suicidal plan.        Cognition and Memory: Cognition is not impaired. She exhibits impaired recent memory.        Judgment: Judgment normal.     Comments: Insight intact Minimal anxiety     Lab Review:     Component Value Date/Time   NA 139 07/01/2018 1103   NA 140 08/15/2012 1624   K 3.8 07/01/2018 1103   K 3.6 08/15/2012 1624   CL 107 (H) 07/01/2018 1103   CL 106 08/15/2012 1624   CO2 21 07/01/2018 1103   CO2 26 08/15/2012 1624   GLUCOSE 88 07/01/2018 1103   GLUCOSE 153 (H) 03/05/2017 2142   GLUCOSE 83 08/15/2012 1624   BUN 8 07/01/2018 1103   BUN 10 08/15/2012 1624   CREATININE 0.56 (L) 07/01/2018 1103   CREATININE 0.55 (L) 08/15/2012 1624    CALCIUM 8.3 (L) 07/01/2018 1103   CALCIUM  9.0 08/15/2012 1624   PROT 5.6 (L) 07/01/2018 1103   PROT 6.5 08/15/2012 1624   ALBUMIN 3.4 (L) 07/01/2018 1103   ALBUMIN 3.4 08/15/2012 1624   AST 15 07/01/2018 1103   AST 22 08/15/2012 1624   ALT 12 07/01/2018 1103   ALT 23 08/15/2012 1624   ALKPHOS 69 07/01/2018 1103   ALKPHOS 98 08/15/2012 1624   BILITOT 0.5 07/01/2018 1103   BILITOT 0.8 08/15/2012 1624   GFRNONAA 112 07/01/2018 1103   GFRNONAA >60 08/15/2012 1624   GFRAA 129 07/01/2018 1103   GFRAA >60 08/15/2012 1624       Component Value Date/Time   WBC 7.5 10/08/2018 1603   WBC 6.9 12/21/2017 1505   RBC 4.68 10/08/2018 1603   RBC 4.76 12/21/2017 1505   HGB 14.2 10/08/2018 1603   HCT 43.4 10/08/2018 1603   PLT 371 10/08/2018 1603   MCV 93 10/08/2018 1603   MCV 91 08/15/2012 1624   MCH 30.3 10/08/2018 1603   MCH 31.1 12/21/2017 1505   MCHC 32.7 10/08/2018 1603   MCHC 33.4 12/21/2017 1505   RDW 12.9 10/08/2018 1603   RDW 15.7 (H) 08/15/2012 1624   LYMPHSABS 1.3 12/21/2017 1505   LYMPHSABS 1.5 10/08/2015 1107   LYMPHSABS 1.9 08/15/2012 1624   MONOABS 0.6 12/21/2017 1505   MONOABS 0.8 08/15/2012 1624   EOSABS 0.3 12/21/2017 1505   EOSABS 0.1 10/08/2015 1107   EOSABS 0.2 08/15/2012 1624   BASOSABS 0.1 12/21/2017 1505   BASOSABS 0.0 10/08/2015 1107   BASOSABS 0.0 08/15/2012 1624    No results found for: POCLITH, LITHIUM   No results found for: PHENYTOIN, PHENOBARB, VALPROATE, CBMZ   11/01/2021 vitamin D 16, B12 295  .res Assessment: Plan:    Candace was seen today for follow-up and fatigue.  Diagnoses and all orders for this visit:  Bipolar I disorder (HCC)  Mixed obsessional thoughts and acts  Panic disorder with agoraphobia  Generalized anxiety disorder  Insomnia due to mental condition  REM sleep behavior disorder      .Greater than 50% of 30-minute face to face time with patient was spent on counseling and coordination of care. We discussed  Good response and stability with psych meds.  However had to stop [name] which is working.  This is very unfortunate.  Historically she has relapse with symptoms whenever she has stopped [name].  However she has been stable since last visit with mood.  Disc dx RBSD.  TX of choice is clonazepam 0.5-1.0 mg HS   Since stopping the Latuda she is not had marked depression or mania but had unexpected recurrence of obsessive-compulsive symptoms.  She had these types of symptoms as a teenager but has not had severe symptoms as an adult and recent years.  The increase in Trileptal to 900 mg daily did not help the OCD. She is not markedly depressed.  The increase in fluvoxamine did help with the OCD and she is tolerated it.  Discussed the risk that SSRIs can trigger mood swings and cause mania.   She is hypomanic and more labile than at the last visit she is rapid cycling but is not severe.  She attributes to this to the increase in Topamax because she said she did not have that symptom until the Topamax was increased.  History of gastric bypass and secondary history low B12 She is taking oral B12  And added vitamin D   Continue Trileptal to 900 mg dayly.    Informed her  that we may have to go much higher and to particularly let us know if this insomnia persist.  Disc the off-label use of N-Acetylcysteine at 600 mg daily to help with mild cognitive problems.  It can be combined with a B-complex vitamin as the B-12 and folate have been shown to sometimes enhance the effect.   Discussed side effects of each med in detail.    fluvoxamine 300 mg nightly. Disc DDI with Xanax and use lower dosage.    Continue topiramate 50 mg in the morning and 100 mg in the evening.  The reduction resolved hypomania.  This is controlling headache  Continue Wellbutrin XL 150 mg every morning because it helped with depression and she is tolerating it well.   We discussed the short-term risks associated with benzodiazepines  including sedation and increased fall risk among others.  Discussed long-term side effect risk including dependence, potential withdrawal symptoms, and the potential eventual dose-related risk of dementia. Use LED especially in view of the effect of Luvox on the Xanax.  Follow-up 3-4 months or sooner as needed  Meredith Staggers MD, DFAPA  Please see After Visit Summary for patient specific instructions.  No future appointments.   No orders of the defined types were placed in this encounter.    -------------------------------

## 2021-12-15 ENCOUNTER — Encounter: Payer: Self-pay | Admitting: Oncology

## 2021-12-17 ENCOUNTER — Other Ambulatory Visit: Payer: Self-pay | Admitting: Psychiatry

## 2021-12-17 DIAGNOSIS — F411 Generalized anxiety disorder: Secondary | ICD-10-CM

## 2021-12-17 DIAGNOSIS — F319 Bipolar disorder, unspecified: Secondary | ICD-10-CM

## 2021-12-30 ENCOUNTER — Other Ambulatory Visit: Payer: Self-pay | Admitting: Family Medicine

## 2022-01-30 ENCOUNTER — Ambulatory Visit: Payer: Self-pay | Admitting: Family Medicine

## 2022-02-03 ENCOUNTER — Encounter: Payer: Self-pay | Admitting: Oncology

## 2022-02-03 ENCOUNTER — Other Ambulatory Visit: Payer: Self-pay

## 2022-02-03 ENCOUNTER — Encounter: Payer: Self-pay | Admitting: Family Medicine

## 2022-02-03 ENCOUNTER — Ambulatory Visit (INDEPENDENT_AMBULATORY_CARE_PROVIDER_SITE_OTHER): Payer: 59 | Admitting: Family Medicine

## 2022-02-03 VITALS — BP 101/65 | HR 62 | Temp 98.0°F | Resp 14 | Ht 69.0 in | Wt 165.0 lb

## 2022-02-03 DIAGNOSIS — I2699 Other pulmonary embolism without acute cor pulmonale: Secondary | ICD-10-CM | POA: Diagnosis not present

## 2022-02-03 DIAGNOSIS — Z23 Encounter for immunization: Secondary | ICD-10-CM | POA: Diagnosis not present

## 2022-02-03 DIAGNOSIS — E538 Deficiency of other specified B group vitamins: Secondary | ICD-10-CM

## 2022-02-03 DIAGNOSIS — J029 Acute pharyngitis, unspecified: Secondary | ICD-10-CM | POA: Diagnosis not present

## 2022-02-03 DIAGNOSIS — F319 Bipolar disorder, unspecified: Secondary | ICD-10-CM

## 2022-02-03 DIAGNOSIS — R739 Hyperglycemia, unspecified: Secondary | ICD-10-CM

## 2022-02-03 DIAGNOSIS — E559 Vitamin D deficiency, unspecified: Secondary | ICD-10-CM

## 2022-02-03 DIAGNOSIS — R7309 Other abnormal glucose: Secondary | ICD-10-CM

## 2022-02-03 DIAGNOSIS — E611 Iron deficiency: Secondary | ICD-10-CM

## 2022-02-03 LAB — POCT RAPID STREP A (OFFICE): Rapid Strep A Screen: NEGATIVE

## 2022-02-03 NOTE — Patient Instructions (Signed)
Please review the attached list of medications and notify my office if there are any errors.   Please go to the lab draw station in [address] on the second floor of [facility]. Normal hours are 8:00am to 11:30am and 1:00pm to 4:00pm Monday through Friday  

## 2022-02-03 NOTE — Progress Notes (Signed)
I,Roshena L Chambers,acting as a scribe for Mila Merry, MD.,have documented all relevant documentation on the behalf of Mila Merry, MD,as directed by  Mila Merry, MD while in the presence of Mila Merry, MD.   Established patient visit   Patient: Kristen [name]   DOB: 1970-12-21   51 y.o. Female  MRN: [id number] Visit Date: 02/03/2022  Today's healthcare provider: Mila Merry, MD   Chief Complaint  Patient presents with   Follow-up   Sore Throat   Subjective    HPI  Follow up for history of DVT:   The patient was last seen for this more than 1 year ago.   Changes made at last visit include none. Current treatment includes Xarelto 10mg  daily.   She reports good compliance with treatment. She feels that condition is Unchanged. She is not having side effects.    -----------------------------------------------------------------------------------------   Sore throat: Patient complains of sore throat since this morning. She works as a and report possible exposure to strep throat.   She is also noted to have been B12 and D deficient on labs done by Dr. Midwife in November and is taking supplements consistently.  Lab Results  Component Value Date   VD25OH 16 (L) 11/01/2021    Lab Results  Component Value Date   VITAMINB12 295 11/01/2021   She also has history of iron deficiency anemia and elevated A1c Lab Results  Component Value Date   HGBA1C 6.0 (H) 03/09/2017   She does report strong family history of diabetes.   Medications: Outpatient Medications Prior to Visit  Medication Sig   ALPRAZolam (XANAX) 0.5 MG tablet Take 1 tablet (0.5 mg total) by mouth 3 (three) times daily as needed for anxiety.   buPROPion (WELLBUTRIN XL) 150 MG 24 hr tablet TAKE ONE TABLET (150 MG) BY MOUTH EVERY MORNING   clonazePAM (KLONOPIN) 0.5 MG tablet 1-2 tablets at night for sleep disorder   fluvoxaMINE (LUVOX) 100 MG tablet Take 3 tablets (300 mg  total) by mouth daily.   lamoTRIgine (LAMICTAL) 200 MG tablet Take 1 tablet (200 mg total) by mouth daily.   levonorgestrel (MIRENA) 20 MCG/24HR IUD by Intrauterine route.   Oxcarbazepine (TRILEPTAL) 300 MG tablet TAKE ONE TABLET EVERY MORNING AND TAKE TWO TABLETS AT BEDTIME   propranolol (INDERAL) 20 MG tablet TAKE ONE TO TWO TABLETS BY MOUTH 3 TIMESDAILY AS NEEDED FOR RESTLESSNESS   topiramate (TOPAMAX) 50 MG tablet TAKE 1 TABLET BY MOUTH EVERY MORNING AND2 TABLETS IN THE EVENING   vitamin B-12 (CYANOCOBALAMIN) 500 MCG tablet Take 1 tablet (500 mcg total) by mouth daily.   Vitamin D, Ergocalciferol, (DRISDOL) 1.25 MG (50000 UNIT) CAPS capsule Take 1 capsule (50,000 Units total) by mouth every 7 (seven) days.   XARELTO 10 MG TABS tablet TAKE ONE TABLET BY MOUTH EVERY DAY   No facility-administered medications prior to visit.    Review of Systems  Constitutional:  Negative for appetite change, chills, fatigue and fever.  HENT:  Positive for sore throat.   Respiratory:  Negative for chest tightness and shortness of breath.   Cardiovascular:  Negative for chest pain and palpitations.  Gastrointestinal:  Negative for abdominal pain, nausea and vomiting.  Musculoskeletal:  Positive for joint swelling (in ankles at the end of each day; resolves with rest and elevation).  Neurological:  Negative for dizziness and weakness.      Objective    BP 101/65 (BP Location: Left Arm, Patient Position: Sitting, Cuff  Size: Normal)    Pulse 62    Temp 98 F (36.7 C) (Oral)    Resp 14    Ht 5\' 9"  (1.753 m)    Wt 165 lb (74.8 kg)    SpO2 100% Comment: room air   BMI 24.37 kg/m  {Show previous vital signs (optional):23777}  Physical Exam    General: Appearance:    Well developed, well nourished female in no acute distress  Eyes:    PERRL, conjunctiva/corneas clear, EOM's intact       Lungs:     Clear to auscultation bilaterally, respirations unlabored  Heart:    Normal heart rate. Normal rhythm. No  murmurs, rubs, or gallops.    MS:   All extremities are intact.    Neurologic:   Awake, alert, oriented x 3. No apparent focal neurological defect.         Results for orders placed or performed in visit on 02/03/22  POCT rapid strep A  Result Value Ref Range   Rapid Strep A Screen Negative Negative    Assessment & Plan     1. Other pulmonary embolism without acute cor pulmonale, unspecified chronicity (HCC) Doing well on DOAC - CBC  2. Sore throat Viral URI  3. Bipolar I disorder (HCC) Doing well on current medications managed by Dr. 02/05/22  4. B12 deficiency On b12 supplements.  - Vitamin B12  5. Vitamin D deficiency On d supplements.  - VITAMIN D 25 Hydroxy (Vit-D Deficiency, Fractures)  6. Hyperglycemia  - Hemoglobin A1c - Cholesterol, Total  8. Iron deficiency  - Ferritin  9. Need for tetanus, diphtheria, and acellular pertussis (Tdap) vaccine in patient of adolescent age or older  - Administer Tetanus-diphtheria-acellular pertussis (Tdap) vaccine      The entirety of the information documented in the History of Present Illness, Review of Systems and Physical Exam were personally obtained by me. Portions of this information were initially documented by the CMA and reviewed by me for thoroughness and accuracy.     Jennelle Human, MD  [facility] [phone] (phone) [phone] (fax)  [facility]

## 2022-02-10 ENCOUNTER — Other Ambulatory Visit: Payer: Self-pay | Admitting: Psychiatry

## 2022-02-10 ENCOUNTER — Telehealth: Payer: Self-pay | Admitting: Family Medicine

## 2022-02-10 DIAGNOSIS — G4752 REM sleep behavior disorder: Secondary | ICD-10-CM

## 2022-02-10 DIAGNOSIS — G43009 Migraine without aura, not intractable, without status migrainosus: Secondary | ICD-10-CM

## 2022-02-10 LAB — VITAMIN B12: Vitamin B-12: 1861 pg/mL — ABNORMAL HIGH (ref 232–1245)

## 2022-02-10 LAB — CBC
Hematocrit: 30.8 % — ABNORMAL LOW (ref 34.0–46.6)
Hemoglobin: 9.5 g/dL — ABNORMAL LOW (ref 11.1–15.9)
MCH: 23.6 pg — ABNORMAL LOW (ref 26.6–33.0)
MCHC: 30.8 g/dL — ABNORMAL LOW (ref 31.5–35.7)
MCV: 77 fL — ABNORMAL LOW (ref 79–97)
Platelets: 429 10*3/uL (ref 150–450)
RBC: 4.02 x10E6/uL (ref 3.77–5.28)
RDW: 15.1 % (ref 11.7–15.4)
WBC: 6.4 10*3/uL (ref 3.4–10.8)

## 2022-02-10 LAB — CHOLESTEROL, TOTAL: Cholesterol, Total: 226 mg/dL — ABNORMAL HIGH (ref 100–199)

## 2022-02-10 LAB — HEMOGLOBIN A1C
Est. average glucose Bld gHb Est-mCnc: 123 mg/dL
Hgb A1c MFr Bld: 5.9 % — ABNORMAL HIGH (ref 4.8–5.6)

## 2022-02-10 LAB — VITAMIN D 25 HYDROXY (VIT D DEFICIENCY, FRACTURES): Vit D, 25-Hydroxy: 37.7 ng/mL (ref 30.0–100.0)

## 2022-02-10 LAB — FERRITIN: Ferritin: 8 ng/mL — ABNORMAL LOW (ref 15–150)

## 2022-02-10 NOTE — Telephone Encounter (Signed)
I called and spoke with patient. She states her B12 and cholesterol levels were off on her recent blood work. She wants to know if anything needs to be done about that. Does Dr. Sherrie Mustache have any recommendations?  Please advise.

## 2022-02-10 NOTE — Telephone Encounter (Signed)
Pt is calling for Dr. Theodis Aguas comments regarding her A1C, cholesterol. Pt did she Dr. Esmond Camper Pt Mychart about the low iron.  Please advise CB- [phone]

## 2022-02-11 NOTE — Telephone Encounter (Signed)
Her b12 level is probably high since she is so iron deficient and her blood cells are unable to use it. Is she is taking a b12 supplement then she can stop if for the time being.  Her cholesterol is not high enough to require any medications. Just try to cut back on saturated fats in diet and check yearly. Kristen [name]

## 2022-02-13 NOTE — Telephone Encounter (Signed)
Patient has been advised. KW 

## 2022-02-16 ENCOUNTER — Other Ambulatory Visit: Payer: Self-pay

## 2022-02-16 ENCOUNTER — Inpatient Hospital Stay: Payer: Self-pay | Attending: Oncology | Admitting: Oncology

## 2022-02-17 ENCOUNTER — Telehealth: Payer: Self-pay | Admitting: Oncology

## 2022-02-17 NOTE — Telephone Encounter (Signed)
Left VM with patient to reschedule her virtual visit from 3/2. Requested she call back so that we may set that up.  ?

## 2022-02-20 ENCOUNTER — Other Ambulatory Visit: Payer: Self-pay | Admitting: Psychiatry

## 2022-02-20 DIAGNOSIS — F422 Mixed obsessional thoughts and acts: Secondary | ICD-10-CM

## 2022-02-20 DIAGNOSIS — F411 Generalized anxiety disorder: Secondary | ICD-10-CM

## 2022-03-09 ENCOUNTER — Telehealth: Payer: Self-pay | Admitting: *Deleted

## 2022-03-09 NOTE — Telephone Encounter (Signed)
I did this one. ?

## 2022-03-09 NOTE — Telephone Encounter (Signed)
Patient called and left a vm on scheduling line. She requsted that her mychart visit be r/s with Dr. Janese Banks. I attempted to reach patient back. No answer. I left a vm that her call was being returned to r/s her apts. I encouraged the patient to call back and r/s her apts or send a mychart msg to let us know her r/s preferences. ?

## 2022-03-14 ENCOUNTER — Inpatient Hospital Stay: Payer: Self-pay | Attending: Oncology | Admitting: Oncology

## 2022-03-14 ENCOUNTER — Other Ambulatory Visit: Payer: Self-pay

## 2022-03-14 ENCOUNTER — Ambulatory Visit (INDEPENDENT_AMBULATORY_CARE_PROVIDER_SITE_OTHER): Payer: 59 | Admitting: Psychiatry

## 2022-03-14 ENCOUNTER — Encounter: Payer: Self-pay | Admitting: Psychiatry

## 2022-03-14 ENCOUNTER — Encounter: Payer: Self-pay | Admitting: Oncology

## 2022-03-14 DIAGNOSIS — F319 Bipolar disorder, unspecified: Secondary | ICD-10-CM | POA: Diagnosis not present

## 2022-03-14 DIAGNOSIS — G4752 REM sleep behavior disorder: Secondary | ICD-10-CM

## 2022-03-14 DIAGNOSIS — F4001 Agoraphobia with panic disorder: Secondary | ICD-10-CM | POA: Diagnosis not present

## 2022-03-14 DIAGNOSIS — F422 Mixed obsessional thoughts and acts: Secondary | ICD-10-CM | POA: Diagnosis not present

## 2022-03-14 DIAGNOSIS — D509 Iron deficiency anemia, unspecified: Secondary | ICD-10-CM

## 2022-03-14 DIAGNOSIS — F5105 Insomnia due to other mental disorder: Secondary | ICD-10-CM

## 2022-03-14 DIAGNOSIS — E611 Iron deficiency: Secondary | ICD-10-CM

## 2022-03-14 DIAGNOSIS — F411 Generalized anxiety disorder: Secondary | ICD-10-CM

## 2022-03-14 DIAGNOSIS — E538 Deficiency of other specified B group vitamins: Secondary | ICD-10-CM

## 2022-03-14 DIAGNOSIS — F515 Nightmare disorder: Secondary | ICD-10-CM

## 2022-03-14 DIAGNOSIS — G2581 Restless legs syndrome: Secondary | ICD-10-CM

## 2022-03-14 DIAGNOSIS — G43009 Migraine without aura, not intractable, without status migrainosus: Secondary | ICD-10-CM

## 2022-03-14 DIAGNOSIS — R7989 Other specified abnormal findings of blood chemistry: Secondary | ICD-10-CM

## 2022-03-14 MED ORDER — TOPIRAMATE 50 MG PO TABS: ORAL_TABLET | ORAL | 1 refills | Status: DC

## 2022-03-14 MED ORDER — DOXAZOSIN MESYLATE 2 MG PO TABS: ORAL_TABLET | ORAL | 1 refills | Status: DC

## 2022-03-14 MED ORDER — FLUVOXAMINE MALEATE 100 MG PO TABS: 300.0000 mg | ORAL_TABLET | Freq: Every day | ORAL | 1 refills | Status: DC

## 2022-03-14 MED ORDER — CLONAZEPAM 0.5 MG PO TABS: ORAL_TABLET | ORAL | 5 refills | Status: DC

## 2022-03-14 MED ORDER — OXCARBAZEPINE 300 MG PO TABS: ORAL_TABLET | ORAL | 1 refills | Status: DC

## 2022-03-14 MED ORDER — BUPROPION HCL ER (XL) 150 MG PO TB24: 150.0000 mg | ORAL_TABLET | Freq: Every day | ORAL | 1 refills | Status: DC

## 2022-03-14 MED ORDER — LAMOTRIGINE 200 MG PO TABS: 200.0000 mg | ORAL_TABLET | Freq: Every day | ORAL | 1 refills | Status: DC

## 2022-03-14 NOTE — Progress Notes (Signed)
Kristen [name] ?[id number] ?1971-09-21 ?51 y.o.  ? ? ? ?Subjective:  ? ?Patient ID:  Kristen [name] is a 51 y.o. (DOB 1971-01-07) female. ? ?Chief Complaint:  ?Chief Complaint  ?Patient presents with  ? Follow-up  ? Depression  ? Anxiety  ? Sleeping Problem  ? ? ?HPI ?Kristen [name] presents to the office today for follow-up of bipolar disorder and anxiety ? ? ?07/16/20 appt with the following noted: ?Thinks topiramate makes her labile and impulsive in speech.  Not me at all.  Didn't start until taking that.  Taking it for migraine and tinnitus.  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  Balance issues. No tx options. ?Otherwise mood is stable. ?Staying up late.  Want to stay up all night. ?Mood lability and irritability and cycling into being too "brave". ?SX started when increased topiramate to current dose. ?OCD is a lot better. ?She thinks Wellbutrin helps depression.  Pleased she's not depressed. ?Plan: Continue Trileptal to 900 mg dayly.    ? fluvaxamine 300 mg nightly. ?Continue Wellbutrin XL 150 every morning ?Reduce topiramate to 50 mg AM and 100 PM bc she felt it contributed to manic sx ? ?09/29/2020 appointment with the following noted: ?Better with reduced topiramate with resolution of HA and manic subsided quite a bit. ?Not depressed.  Mood evened out. ?No SE meds. ?Needs refill fo Xanax and takes it nightly.  Mistake and received 0.25 instead of 0.5 mg tablets.  Not needed much daytime for anxiety. ?Patient reports stable mood and denies depressed or irritable moods.  Patient denies any recent difficulty with anxiety.  Patient denies difficulty with sleep initiation or maintenance. With meds 6-7 hours of sleep.  Denies appetite disturbance.  Patient reports that energy and motivation have been good.  Patient denies any difficulty with concentration.  Patient denies any suicidal ideation. ?Plan: No med changes ? ?02/28/2021 appointment with the following noted: ?OK overall.  Some other  issues from fall and consussion she had.  Including hearing affected.  Hearing aid.  Memory affected also by Covid per neuro.  Missed work for [employer].  Neuro took her out of work for a couple of months. ?Mood has been great and stable. ?Plan: No med changes ? ?08/31/2021 appointment with the following noted: ?Hearing loss from concussion about 30% and has tinnitus.   ?Pretty good from mental health perspective.  No mood swings.  Not depressed.  Minimal OCD and anxiety.  No SE with meds. ?Sleep is weird, sort of sleep walk.  May or may not recognize it.  Talks in sleep.  May yell at [place].  Touches things beside her bed.  Happens 2-3 times per week. ?Has neuro but not seen since March. ?History of low B12 and was on shots. ?Hx G bypass 2010 ?Infrequent Xanax. ? ?08/31/21 TC to pt: Her vitamin D level is much too low and has been historically too low.  That can contribute to fatigue and depression and cognitive problems.  I sent in a prescription for once a week vitamin D and high dose. ?Her B12 levels have historically been low as well and are currently borderline.  I sent in a prescription for vitamin B12 as well and that may help her energy, concentration, and mood. ?Please call and give her this information. ? ?12/08/2021 appointment noted: ?Started B12 and vitamin D.   ?Overall doing ok.  A little bit of benefit noted with vitamins.  Severe osteoporsis in family. ?No SE with meds. ?  Patient reports stable mood and denies depressed or irritable moods.  Patient mild recent difficulty with anxiety.  Patient denies difficulty with sleep initiation or maintenance. Denies appetite disturbance.  Patient reports that energy and motivation have been good.  Patient denies any difficulty with concentration.  Patient denies any suicidal ideation. ?Bad NM once every 2-3 weeks and less than it was. 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 ?HA managed. ?Plan: No med changes ? ?03/14/2022 appointment with the following noted: ?Still taking Wellbutrin XL 150  mg every morning, clonazepam 0.5 mg nightly as needed insomnia, fluvoxamine 300, lamotrigine 200, oxcarbazepine 300 every morning and 600 nightly, propranolol 20 mg 3 times daily as needed restlessness, topiramate 50 every morning and 100 nightly and vitamin B12 ?Doing ok overall.  Iron really low. Causing RLS ?NM and talking in her sleep bothers H. Awaken her twice weekly.  Occ sleep walking. ?Clonazepam can give a hangover. ? ?Past Psychiatric Medication Trials: Vraylar 3 restless, Latuda 80, risperidone nonresponse, ?Seroquel weight gain, Abilify 10,  ?oxcarbazepine 900 SE,    lamotrigine 300,  ?lithium,  ? topiramate  100 mg BID hypomania.  ?pramipexole,  ?duloxetine, Lexapro, Wellbutrin, fluoxetine, venlafaxine, sertraline,  fluvoxamine 300 ?clonazepam, alprazolam,  ?buspar NR ? ?Review of Systems:  ?Review of Systems  ?Constitutional:  Positive for fatigue.  ?HENT:  Positive for hearing loss and tinnitus.   ?Neurological:  Negative for tremors, weakness and headaches.  ?     RLS returned  ?Psychiatric/Behavioral:  Positive for decreased concentration. Negative for confusion and dysphoric mood. The patient is not nervous/anxious.   ? ?Medications: I have reviewed the patient's current medications. ? ?Current Outpatient Medications  ?Medication Sig Dispense Refill  ? ALPRAZolam (XANAX) 0.5 MG tablet Take 1 tablet (0.5 mg total) by mouth 3 (three) times daily as needed for anxiety. 90 tablet 3  ? doxazosin (CARDURA) 2 MG tablet 1/2 tablet at night for 4 nights then 1 nightly 30 tablet 1  ? levonorgestrel (MIRENA) 20 MCG/24HR IUD by Intrauterine route.    ? propranolol (INDERAL) 20 MG tablet TAKE ONE TO TWO TABLETS BY MOUTH 3 TIMESDAILY AS NEEDED FOR RESTLESSNESS 150 tablet 0  ? vitamin B-12 (CYANOCOBALAMIN) 500 MCG tablet Take 1 tablet (500 mcg total) by mouth daily. 90 tablet 1  ? Vitamin D, Ergocalciferol, (DRISDOL) 1.25 MG (50000 UNIT) CAPS capsule Take 1 capsule (50,000 Units total) by mouth every 7 (seven)  days. 15 capsule 3  ? XARELTO 10 MG TABS tablet TAKE ONE TABLET BY MOUTH EVERY DAY 30 tablet 5  ? buPROPion (WELLBUTRIN XL) 150 MG 24 hr tablet Take 1 tablet (150 mg total) by mouth daily. 90 tablet 1  ? clonazePAM (KLONOPIN) 0.5 MG tablet 1-2 TABLETS AT BEDTIME AS NEEDED FOR FORSLEEP DISORDER 30 tablet 5  ? fluvoxaMINE (LUVOX) 100 MG tablet Take 3 tablets (300 mg total) by mouth at bedtime. 270 tablet 1  ? lamoTRIgine (LAMICTAL) 200 MG tablet Take 1 tablet (200 mg total) by mouth daily. 90 tablet 1  ? Oxcarbazepine (TRILEPTAL) 300 MG tablet TAKE ONE TABLET EVERY MORNING AND TAKE TWO TABLETS AT BEDTIME 270 tablet 1  ? topiramate (TOPAMAX) 50 MG tablet TAKE ONE TABLET (50 MG) BY MOUTH EVERY MORNING AND TAKE TWO TABLETS (100 MG) BYMOUTH EVERY EVENING 270 tablet 1  ? ?No current facility-administered medications for this visit.  ? ? ?Medication Side Effects: None ? ?Allergies:  ?Allergies  ?Allergen Reactions  ? Codeine Nausea Only  ?  Can tolerate medication when taken with food  ? ? ?  Past Medical History:  ?Diagnosis Date  ? Anemia   ? Anxiety   ? Bipolar disorder (HCC)   ? Cholelithiasis   ? DVT (deep venous thrombosis) (HCC)   ? GERD (gastroesophageal reflux disease)   ? Kidney stones   ? Pulmonary embolism (HCC)   ? RLS (restless legs syndrome)   ? ? ?Family History  ?Problem Relation Age of Onset  ? Diabetes Father   ? Stroke Father   ? Heart attack Father   ? Kidney failure Father   ? Hypertension Father   ? Uterine cancer Maternal Grandmother   ? Ulcers Maternal Grandmother   ? Diabetes Maternal Grandfather   ? Stroke Maternal Grandfather   ? Breast cancer Paternal Grandmother   ? Colon cancer Paternal Grandfather   ? Hypertension Paternal Grandfather   ? ? ?Social History  ? ?Socioeconomic History  ? Marital status: Married  ?  Spouse name: Not on file  ? Number of children: 3  ? Years of education: Not on file  ? Highest education level: Not on file  ?Occupational History  ? Occupation: Architectural technologistteacher's assistant  ?   Employer: [employer]  ?Tobacco Use  ? Smoking status: Never  ? Smokeless tobacco: Never  ?Vaping Use  ? Vaping Use: Never used  ?Substance and Sexual Activity  ? Alcohol use: No  ?  Alcoho

## 2022-03-16 ENCOUNTER — Telehealth: Payer: Self-pay | Admitting: Oncology

## 2022-03-16 NOTE — Progress Notes (Signed)
I connected with Kristen [name] on 03/16/22 at  3:00 PM EDT by video enabled telemedicine visit and verified that I am speaking with the correct person using two identifiers. ?  ?I discussed the limitations, risks, security and privacy concerns of performing an evaluation and management service by telemedicine and the availability of in-person appointments. I also discussed with the patient that there may be a patient responsible charge related to this service. The patient expressed understanding and agreed to proceed. ? ?Other persons participating in the visit and their role in the encounter:  none ? ?Patient's location:  home ?Provider's location:  work ? ?Chief Complaint: Follow-up of iron deficiency anemia ? ?History of present illness: patient is a 51 year old female with a history of gastric bypass surgery in 2010. Prior to that she had a right lower extremity DVT in 2006 and was on Coumadin briefly. Prior gastric bypass surgery she had IVC filter placement. 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 There were attempts made to retrieve her IVC filter but that could not be done. She has also had a hypercoagulable workup in the past which did not reveal any cause for her DVT and PE. Currently patient is on 20 mg alternating with 15 mg of Coumadin every day but has had problems with INR control over the last couple of months. ?  ?She was seen by Dr. Vicente Males for iron deficiency anemia. CBC on 03/09/2017 showed white count of 9.9, H&H of 10.2/34.4 with an MCV of 73 and a platelet count of 533. CMP and TSH was within normal limits. Ferritin was low at 10. B12 was low at 117. Folate was normal. Celiac disease panel was negative. Intrinsic factor antibody was within normal limits. Urinalysis did not reveal any evidence of hematuria. Patient underwent EGD and colonoscopy which did not reveal any evidence of bleeding. There was evidence of gastric bypass. Patient  has not had a capsule endoscopy yet. ?  ?Results of bloodwork from 04/23/2017 were as follows: CBC showed white count of 7. H&H 12.6/39.3 with an MCV of 79.3 and a platelet count of 417. Ferritin was low at 11. Serum iron was mildly elevated at 197. Iron saturation was 46%. B12 was high normal at 277. Folate, copper and zinc within normal limits ?  ?She has received 2 doses of feraheme so far ?  ? ?Interval history She is doing overall well but does report some ongoing fatigue.  Denies other complaints at this time.  Denies any blood loss in her stool or urine ? ? ?Review of Systems  ?Constitutional:  Positive for malaise/fatigue. Negative for chills, fever and weight loss.  ?HENT:  Negative for congestion, ear discharge and nosebleeds.   ?Eyes:  Negative for blurred vision.  ?Respiratory:  Negative for cough, hemoptysis, sputum production, shortness of breath and wheezing.   ?Cardiovascular:  Negative for chest pain, palpitations, orthopnea and claudication.  ?Gastrointestinal:  Negative for abdominal pain, blood in stool, constipation, diarrhea, heartburn, melena, nausea and vomiting.  ?Genitourinary:  Negative for dysuria, flank pain, frequency, hematuria and urgency.  ?Musculoskeletal:  Negative for back pain, joint pain and myalgias.  ?Skin:  Negative for rash.  ?Neurological:  Negative for dizziness, tingling, focal weakness, seizures, weakness and headaches.  ?Endo/Heme/Allergies:  Does not bruise/bleed easily.  ?Psychiatric/Behavioral:  Negative for depression and suicidal ideas. The patient does not have insomnia.   ? ?Allergies  ?Allergen Reactions  ?? Codeine Nausea Only  ?  Can tolerate medication when taken with food  ? ? ?Past Medical History:  ?Diagnosis Date  ?? Anemia   ?? Anxiety   ?? Bipolar disorder ([place])   ?? Cholelithiasis   ?? DVT (deep venous thrombosis) ([place])   ?? GERD (gastroesophageal reflux disease)   ?? Kidney stones   ?? Pulmonary embolism ([place])   ?? RLS (restless legs syndrome)    ? ? ?Past Surgical History:  ?Procedure Laterality Date  ?? BREAST BIOPSY Bilateral 2013/2014  ? neg  ?? CERVICAL BIOPSY  W/ LOOP ELECTRODE EXCISION  2007  ?? CHOLECYSTECTOMY    ?? COLONOSCOPY WITH PROPOFOL N/A 04/03/2017  ? Procedure: COLONOSCOPY WITH PROPOFOL;  Surgeon: Jonathon Bellows, MD;  Location[facility] ENDOSCOPY;  Service: Endoscopy;  Laterality: N/A;  ?? ESOPHAGOGASTRODUODENOSCOPY (EGD) WITH PROPOFOL N/A 04/03/2017  ? Procedure: ESOPHAGOGASTRODUODENOSCOPY (EGD) WITH PROPOFOL;  Surgeon: Jonathon Bellows, MD;  Location: [facility] ENDOSCOPY;  Service: Endoscopy;  Laterality: N/A;  ?? GASTRIC BYPASS  about 2010  ? initially lost 158 pounds  ?? HERNIA REPAIR    ?? IVC FILTER INSERTION  11/08/2009  ? Docia Furl MD [place], [place]  ?? kidney stone removal    ?? KNEE ARTHROSCOPY    ?? TONSILLECTOMY    ? ? ?Social History  ? ?Socioeconomic History  ?? Marital status: Married  ?  Spouse name: Not on file  ?? Number of children: 3  ?? Years of education: Not on file  ?? Highest education level: Not on file  ?Occupational History  ?? Occupation: Optometrist  ?  Employer: [employer]  ?Tobacco Use  ?? Smoking status: Never  ?? Smokeless tobacco: Never  ?Vaping Use  ?? Vaping Use: Never used  ?Substance and Sexual Activity  ?? Alcohol use: No  ?  Alcohol/week: 0.0 standard drinks  ?? Drug use: No  ?? Sexual activity: Yes  ?  Partners: Male  ?  Birth control/protection: I.U.D.  ?  Comment: mirena  ?Other Topics Concern  ?? Not on file  ?Social History Narrative  ?? Not on file  ? ?Social Determinants of Health  ? ?Financial Resource Strain: Not on file  ?Food Insecurity: Not on file  ?Transportation Needs: Not on file  ?Physical Activity: Not on file  ?Stress: Not on file  ?Social Connections: Not on file  ?Intimate Partner Violence: Not on file  ? ? ?Family History  ?Problem Relation Age of Onset  ?? Diabetes Father   ?? Stroke Father   ?? Heart attack Father   ?? Kidney failure Father   ?? Hypertension  Father   ?? Uterine cancer Maternal Grandmother   ?? Ulcers Maternal Grandmother   ?? Diabetes Maternal Grandfather   ?? Stroke Maternal Grandfather   ?? Breast cancer Paternal Grandmother   ?? Colon cancer Paternal Grandfather   ?? Hypertension Paternal Grandfather   ? ? ? ?Current Outpatient Medications:  ??  ALPRAZolam (XANAX) 0.5 MG tablet, Take 1 tablet (0.5 mg total) by mouth 3 (three) times daily as needed for anxiety., Disp: 90 tablet, Rfl: 3 ??  buPROPion (WELLBUTRIN XL) 150 MG 24 hr tablet, Take 1 tablet (150 mg total) by mouth daily., Disp: 90 tablet, Rfl: 1 ??  clonazePAM (KLONOPIN) 0.5 MG tablet, 1-2 TABLETS AT BEDTIME AS NEEDED FOR FORSLEEP DISORDER, Disp: 30 tablet, Rfl: 5 ??  doxazosin (CARDURA) 2 MG tablet, 1/2 tablet at night for 4 nights then 1 nightly, Disp: 30 tablet, Rfl: 1 ??  fluvoxaMINE (LUVOX) 100 MG tablet,  Take 3 tablets (300 mg total) by mouth at bedtime., Disp: 270 tablet, Rfl: 1 ??  lamoTRIgine (LAMICTAL) 200 MG tablet, Take 1 tablet (200 mg total) by mouth daily., Disp: 90 tablet, Rfl: 1 ??  levonorgestrel (MIRENA) 20 MCG/24HR IUD, by Intrauterine route., Disp: , Rfl:  ??  Oxcarbazepine (TRILEPTAL) 300 MG tablet, TAKE ONE TABLET EVERY MORNING AND TAKE TWO TABLETS AT BEDTIME, Disp: 270 tablet, Rfl: 1 ??  propranolol (INDERAL) 20 MG tablet, TAKE ONE TO TWO TABLETS BY MOUTH 3 TIMESDAILY AS NEEDED FOR RESTLESSNESS, Disp: 150 tablet, Rfl: 0 ??  topiramate (TOPAMAX) 50 MG tablet, TAKE ONE TABLET (50 MG) BY MOUTH EVERY MORNING AND TAKE TWO TABLETS (100 MG) BYMOUTH EVERY EVENING, Disp: 270 tablet, Rfl: 1 ??  vitamin B-12 (CYANOCOBALAMIN) 500 MCG tablet, Take 1 tablet (500 mcg total) by mouth daily., Disp: 90 tablet, Rfl: 1 ??  Vitamin D, Ergocalciferol, (DRISDOL) 1.25 MG (50000 UNIT) CAPS capsule, Take 1 capsule (50,000 Units total) by mouth every 7 (seven) days., Disp: 15 capsule, Rfl: 3 ??  XARELTO 10 MG TABS tablet, TAKE ONE TABLET BY MOUTH EVERY DAY, Disp: 30 tablet, Rfl: 5 ? ?No results  found. ? ?No images are attached to the encounter. ? ? ? ?  Latest Ref Rng & Units 07/01/2018  ? 11:03 AM  ?CMP  ?Glucose 65 - 99 mg/dL 88    ?BUN 6 - 24 mg/dL 8    ?Creatinine 0.57 - 1.00 mg/dL 0.56    ?[place]

## 2022-03-16 NOTE — Telephone Encounter (Signed)
Left VM with patient. Per MD she needs IV iron. We do not have updated insurance coverage on file for her. I requested she call back to let us know if that has changed so that we can look into her options.  ?

## 2022-03-19 ENCOUNTER — Encounter: Payer: Self-pay | Admitting: Oncology

## 2022-03-20 ENCOUNTER — Ambulatory Visit: Payer: Self-pay | Admitting: Family Medicine

## 2022-03-24 ENCOUNTER — Other Ambulatory Visit: Payer: Self-pay | Admitting: Oncology

## 2022-03-24 ENCOUNTER — Inpatient Hospital Stay: Payer: Self-pay | Attending: Oncology

## 2022-03-24 VITALS — BP 90/59 | HR 58 | Temp 96.0°F | Resp 17

## 2022-03-24 DIAGNOSIS — Z9884 Bariatric surgery status: Secondary | ICD-10-CM | POA: Insufficient documentation

## 2022-03-24 DIAGNOSIS — D509 Iron deficiency anemia, unspecified: Secondary | ICD-10-CM | POA: Insufficient documentation

## 2022-03-24 DIAGNOSIS — E538 Deficiency of other specified B group vitamins: Secondary | ICD-10-CM | POA: Insufficient documentation

## 2022-03-24 MED ORDER — SODIUM CHLORIDE 0.9 % IV SOLN
Freq: Once | INTRAVENOUS | Status: AC
  Filled 2022-03-24: qty 250

## 2022-03-24 MED ORDER — SODIUM CHLORIDE 0.9 % IV SOLN: 200.0000 mg | INTRAVENOUS | Status: DC

## 2022-03-24 MED ORDER — IRON SUCROSE 20 MG/ML IV SOLN
200.0000 mg | Freq: Once | INTRAVENOUS | Status: AC
  Administered 2022-03-24: 200 mg via INTRAVENOUS
  Filled 2022-03-24: qty 10

## 2022-03-24 NOTE — Progress Notes (Signed)
Patient tolerated Venofer infusion well today. Post transfusion BP slightly low 90/59, patient asymptomatic. MD notified. Ok to [employer] if asymptomatic. No further recommendations. RN encouraged fluid intake. Patient stable at discharge. AVS given.    ?

## 2022-04-17 ENCOUNTER — Other Ambulatory Visit: Payer: Self-pay | Admitting: Psychiatry

## 2022-04-17 DIAGNOSIS — G4752 REM sleep behavior disorder: Secondary | ICD-10-CM

## 2022-04-17 DIAGNOSIS — F515 Nightmare disorder: Secondary | ICD-10-CM

## 2022-05-16 ENCOUNTER — Ambulatory Visit (INDEPENDENT_AMBULATORY_CARE_PROVIDER_SITE_OTHER): Payer: 59 | Admitting: Family Medicine

## 2022-05-16 VITALS — BP 110/71 | HR 80 | Temp 98.1°F | Resp 16 | Wt 163.0 lb

## 2022-05-16 DIAGNOSIS — R051 Acute cough: Secondary | ICD-10-CM

## 2022-05-16 DIAGNOSIS — J301 Allergic rhinitis due to pollen: Secondary | ICD-10-CM

## 2022-05-16 MED ORDER — FLUTICASONE PROPIONATE 50 MCG/ACT NA SUSP: 2.0000 | Freq: Every day | NASAL | 6 refills | Status: DC

## 2022-05-16 MED ORDER — AMOXICILLIN 500 MG PO CAPS: 1000.0000 mg | ORAL_CAPSULE | Freq: Two times a day (BID) | ORAL | 0 refills | Status: AC

## 2022-05-16 NOTE — Progress Notes (Signed)
Established patient visit  I,[name],acting as a scribe for Kristen Merry, MD.,have documented all relevant documentation on the behalf of Kristen Merry, MD,as directed by  Kristen Merry, MD while in the presence of Kristen Merry, MD.   Patient: Kristen [name]   DOB: March 30, 1971   51 y.o. Female  MRN: [id number] Visit Date: 05/16/2022  Today's healthcare provider: Mila Merry, MD   Chief Complaint  Patient presents with   Cough   Subjective    Upper respiratory symptoms She complains of congestion, facial pain, headache described as productive, nasal congestion, no  fever, productive cough with  green colored sputum, shortness of breath, sinus pressure, sneezing, sore throat, and wheezing.with no fever, chills, night sweats or weight loss. Onset of symptoms was a few months ago and .She is drinking plenty of fluids.  Past history is significant for occasional episodes of bronchitis. Patient is non-smoker. States cough comes and goes. She does report long history of allergies and has been taking OTC allergy medications, but she did much better with prescription allergy nasal sprays previously prescribe by Dr. Elenore [name].   ---------------------------------------------------------------------------------------------------   Medications: Outpatient Medications Prior to Visit  Medication Sig   ALPRAZolam (XANAX) 0.5 MG tablet Take 1 tablet (0.5 mg total) by mouth 3 (three) times daily as needed for anxiety.   buPROPion (WELLBUTRIN XL) 150 MG 24 hr tablet Take 1 tablet (150 mg total) by mouth daily.   clonazePAM (KLONOPIN) 0.5 MG tablet 1-2 TABLETS AT BEDTIME AS NEEDED FOR FORSLEEP DISORDER   doxazosin (CARDURA) 2 MG tablet 1/2 tablet at night for 4 nights then 1 nightly   fluvoxaMINE (LUVOX) 100 MG tablet Take 3 tablets (300 mg total) by mouth at bedtime.   lamoTRIgine (LAMICTAL) 200 MG tablet Take 1 tablet (200 mg total) by mouth daily.   levonorgestrel (MIRENA) 20 MCG/24HR IUD  by Intrauterine route.   Oxcarbazepine (TRILEPTAL) 300 MG tablet TAKE ONE TABLET EVERY MORNING AND TAKE TWO TABLETS AT BEDTIME   propranolol (INDERAL) 20 MG tablet TAKE ONE TO TWO TABLETS BY MOUTH 3 TIMESDAILY AS NEEDED FOR RESTLESSNESS   topiramate (TOPAMAX) 50 MG tablet TAKE ONE TABLET (50 MG) BY MOUTH EVERY MORNING AND TAKE TWO TABLETS (100 MG) BYMOUTH EVERY EVENING   XARELTO 10 MG TABS tablet TAKE ONE TABLET BY MOUTH EVERY DAY   [DISCONTINUED] vitamin B-12 (CYANOCOBALAMIN) 500 MCG tablet Take 1 tablet (500 mcg total) by mouth daily. (Patient not taking: Reported on 05/16/2022)   [DISCONTINUED] Vitamin D, Ergocalciferol, (DRISDOL) 1.25 MG (50000 UNIT) CAPS capsule Take 1 capsule (50,000 Units total) by mouth every 7 (seven) days. (Patient not taking: Reported on 05/16/2022)   No facility-administered medications prior to visit.    Review of Systems  Constitutional:  Negative for appetite change, chills, fatigue and fever.  HENT:  Positive for congestion, rhinorrhea, sinus pressure, sinus pain, sneezing and sore throat.   Respiratory:  Positive for cough, shortness of breath and wheezing. Negative for chest tightness.   Cardiovascular:  Negative for chest pain and palpitations.  Gastrointestinal:  Negative for abdominal pain, nausea and vomiting.  Neurological:  Negative for dizziness and weakness.      Objective    BP 110/71 (BP Location: Right Arm, Patient Position: Sitting, Cuff Size: Normal)   Pulse 80   Temp 98.1 F (36.7 C) (Temporal)   Resp 16   Wt 163 lb (73.9 kg)   SpO2 100%   BMI 24.07 kg/m    Physical Exam  General Appearance:    Well developed, well nourished female, alert, cooperative, in no acute distress  HENT:   neck without nodes, throat normal without erythema or exudate, and frontal and maxillary  sinuses tender  Eyes:    PERRL, conjunctiva/corneas clear, EOM's intact       Lungs:     Clear to auscultation bilaterally, respirations unlabored  Heart:     Normal heart rate. Normal rhythm. No murmurs, rubs, or gallops.    Neurologic:   Awake, alert, oriented x 3. No apparent focal neurological           defect.         Assessment & Plan     1. Acute cough Likely secondary to post nasal drainage.   2. Non-seasonal allergic rhinitis due to pollen  - fluticasone (FLONASE) 50 MCG/ACT nasal spray; Place 2 sprays into both nostrils daily.  Dispense: 16 g; Refill: 6  Cover for sinusitis.  - amoxicillin (AMOXIL) 500 MG capsule; Take 2 capsules (1,000 mg total) by mouth 2 (two) times daily for 7 days.  Dispense: 28 capsule; Refill: 0         Kristen Merry, MD  [facility] [phone] (phone) [phone] (fax)  [facility]

## 2022-05-16 NOTE — Progress Notes (Incomplete)
Established patient visit  I,Kristen [name],acting as a scribe for Mila Merry, MD.,have documented all relevant documentation on the behalf of Mila Merry, MD,as directed by  Mila Merry, MD while in the presence of Mila Merry, MD.   Patient: Kristen [name]   DOB: 1971/07/17   51 y.o. Female  MRN: [id number] Visit Date: 05/16/2022  Today's healthcare provider: Mila Merry, MD   Chief Complaint  Patient presents with  . Cough   Subjective    Upper respiratory symptoms She complains of congestion, facial pain, headache described as productive, nasal congestion, no  fever, productive cough with  green colored sputum, shortness of breath, sinus pressure, sneezing, sore throat, and wheezing.with no fever, chills, night sweats or weight loss. Onset of symptoms was a few months ago and .She is drinking plenty of fluids.  Past history is significant for occasional episodes of bronchitis. Patient is non-smoker. States cough is off and on.   ---------------------------------------------------------------------------------------------------   Medications: Outpatient Medications Prior to Visit  Medication Sig  . ALPRAZolam (XANAX) 0.5 MG tablet Take 1 tablet (0.5 mg total) by mouth 3 (three) times daily as needed for anxiety.  Marland Kitchen buPROPion (WELLBUTRIN XL) 150 MG 24 hr tablet Take 1 tablet (150 mg total) by mouth daily.  . clonazePAM (KLONOPIN) 0.5 MG tablet 1-2 TABLETS AT BEDTIME AS NEEDED FOR FORSLEEP DISORDER  . doxazosin (CARDURA) 2 MG tablet 1/2 tablet at night for 4 nights then 1 nightly  . fluvoxaMINE (LUVOX) 100 MG tablet Take 3 tablets (300 mg total) by mouth at bedtime.  . lamoTRIgine (LAMICTAL) 200 MG tablet Take 1 tablet (200 mg total) by mouth daily.  Marland Kitchen levonorgestrel (MIRENA) 20 MCG/24HR IUD by Intrauterine route.  . Oxcarbazepine (TRILEPTAL) 300 MG tablet TAKE ONE TABLET EVERY MORNING AND TAKE TWO TABLETS AT BEDTIME  . propranolol (INDERAL) 20 MG tablet TAKE ONE TO  TWO TABLETS BY MOUTH 3 TIMESDAILY AS NEEDED FOR RESTLESSNESS  . topiramate (TOPAMAX) 50 MG tablet TAKE ONE TABLET (50 MG) BY MOUTH EVERY MORNING AND TAKE TWO TABLETS (100 MG) BYMOUTH EVERY EVENING  . XARELTO 10 MG TABS tablet TAKE ONE TABLET BY MOUTH EVERY DAY  . [DISCONTINUED] vitamin B-12 (CYANOCOBALAMIN) 500 MCG tablet Take 1 tablet (500 mcg total) by mouth daily. (Patient not taking: Reported on 05/16/2022)  . [DISCONTINUED] Vitamin D, Ergocalciferol, (DRISDOL) 1.25 MG (50000 UNIT) CAPS capsule Take 1 capsule (50,000 Units total) by mouth every 7 (seven) days. (Patient not taking: Reported on 05/16/2022)   No facility-administered medications prior to visit.    Review of Systems  Constitutional:  Negative for appetite change, chills, fatigue and fever.  HENT:  Positive for congestion, rhinorrhea, sinus pressure, sinus pain, sneezing and sore throat.   Respiratory:  Positive for cough, shortness of breath and wheezing. Negative for chest tightness.   Cardiovascular:  Negative for chest pain and palpitations.  Gastrointestinal:  Negative for abdominal pain, nausea and vomiting.  Neurological:  Negative for dizziness and weakness.   {Labs  Heme  Chem  Endocrine  Serology  Results Review (optional):23779}   Objective    BP 110/71 (BP Location: Right Arm, Patient Position: Sitting, Cuff Size: Normal)   Pulse 80   Temp 98.1 F (36.7 C) (Temporal)   Resp 16   Wt 163 lb (73.9 kg)   SpO2 100%   BMI 24.07 kg/m  {Show previous vital signs (optional):23777}  Physical Exam  ***  No results found for any visits on 05/16/22.  Assessment &  Plan     ***  No follow-ups on file.      {provider attestation***:1}   Lelon Huh, MD  [facility] [phone] (phone) [phone] (fax)  [place]

## 2022-05-16 NOTE — Progress Notes (Deleted)
Established patient visit  I,[name],acting as a scribe for Lelon Huh, MD.,have documented all relevant documentation on the behalf of Lelon Huh, MD,as directed by  Lelon Huh, MD while in the presence of Lelon Huh, MD.   Patient: Kristen [name]   DOB: 1971-10-21   51 y.o. Female  MRN: [id number] Visit Date: 05/16/2022  Today's healthcare provider: Lelon Huh, MD   Chief Complaint  Patient presents with   Cough   Subjective    Upper respiratory symptoms She complains of congestion, facial pain, headache described as productive, nasal congestion, no  fever, productive cough with  green colored sputum, shortness of breath, sinus pressure, sneezing, sore throat, and wheezing.with no fever, chills, night sweats or weight loss. Onset of symptoms was a few months ago and staying constant.She is drinking plenty of fluids.  Past history is significant for occasional episodes of bronchitis. Patient is non-smoker  ---------------------------------------------------------------------------------------------------   Medications: Outpatient Medications Prior to Visit  Medication Sig   ALPRAZolam (XANAX) 0.5 MG tablet Take 1 tablet (0.5 mg total) by mouth 3 (three) times daily as needed for anxiety.   buPROPion (WELLBUTRIN XL) 150 MG 24 hr tablet Take 1 tablet (150 mg total) by mouth daily.   clonazePAM (KLONOPIN) 0.5 MG tablet 1-2 TABLETS AT BEDTIME AS NEEDED FOR FORSLEEP DISORDER   doxazosin (CARDURA) 2 MG tablet 1/2 tablet at night for 4 nights then 1 nightly   fluvoxaMINE (LUVOX) 100 MG tablet Take 3 tablets (300 mg total) by mouth at bedtime.   lamoTRIgine (LAMICTAL) 200 MG tablet Take 1 tablet (200 mg total) by mouth daily.   levonorgestrel (MIRENA) 20 MCG/24HR IUD by Intrauterine route.   Oxcarbazepine (TRILEPTAL) 300 MG tablet TAKE ONE TABLET EVERY MORNING AND TAKE TWO TABLETS AT BEDTIME   propranolol (INDERAL) 20 MG tablet TAKE ONE TO TWO TABLETS BY MOUTH 3  TIMESDAILY AS NEEDED FOR RESTLESSNESS   topiramate (TOPAMAX) 50 MG tablet TAKE ONE TABLET (50 MG) BY MOUTH EVERY MORNING AND TAKE TWO TABLETS (100 MG) BYMOUTH EVERY EVENING   XARELTO 10 MG TABS tablet TAKE ONE TABLET BY MOUTH EVERY DAY   [DISCONTINUED] vitamin B-12 (CYANOCOBALAMIN) 500 MCG tablet Take 1 tablet (500 mcg total) by mouth daily. (Patient not taking: Reported on 05/16/2022)   [DISCONTINUED] Vitamin D, Ergocalciferol, (DRISDOL) 1.25 MG (50000 UNIT) CAPS capsule Take 1 capsule (50,000 Units total) by mouth every 7 (seven) days. (Patient not taking: Reported on 05/16/2022)   No facility-administered medications prior to visit.    Review of Systems  Constitutional:  Negative for appetite change, chills, fatigue and fever.  HENT:  Positive for congestion, rhinorrhea, sinus pressure, sinus pain, sneezing and sore throat.   Respiratory:  Positive for cough, shortness of breath and wheezing. Negative for chest tightness.   Cardiovascular:  Negative for chest pain and palpitations.  Gastrointestinal:  Negative for abdominal pain, nausea and vomiting.  Neurological:  Negative for dizziness and weakness.   {Labs  Heme  Chem  Endocrine  Serology  Results Review (optional):23779}   Objective    BP 110/71 (BP Location: Right Arm, Patient Position: Sitting, Cuff Size: Normal)   Pulse 80   Temp 98.1 F (36.7 C) (Temporal)   Resp 16   Wt 163 lb (73.9 kg)   SpO2 100%   BMI 24.07 kg/m  {Show previous vital signs (optional):23777}  Physical Exam  ***  No results found for any visits on 05/16/22.  Assessment & Plan     ***  No follow-ups on file.      {provider attestation***:1}   Javis Abboud, MD  [facility] [phone] (phone) [phone] (fax)  [facility] 

## 2022-05-29 ENCOUNTER — Ambulatory Visit (INDEPENDENT_AMBULATORY_CARE_PROVIDER_SITE_OTHER): Payer: 59 | Admitting: Family Medicine

## 2022-05-29 ENCOUNTER — Encounter: Payer: Self-pay | Admitting: Family Medicine

## 2022-05-29 VITALS — BP 107/65 | HR 72 | Temp 97.6°F | Resp 16 | Wt 162.0 lb

## 2022-05-29 DIAGNOSIS — S29011A Strain of muscle and tendon of front wall of thorax, initial encounter: Secondary | ICD-10-CM | POA: Diagnosis not present

## 2022-05-29 DIAGNOSIS — R052 Subacute cough: Secondary | ICD-10-CM

## 2022-05-29 MED ORDER — AZITHROMYCIN 250 MG PO TABS: ORAL_TABLET | ORAL | 0 refills | Status: AC

## 2022-05-29 MED ORDER — HYDROCODONE BIT-HOMATROP MBR 5-1.5 MG/5ML PO SOLN: 5.0000 mL | Freq: Three times a day (TID) | ORAL | 0 refills | Status: DC | PRN

## 2022-05-29 NOTE — Progress Notes (Signed)
I,Roshena L Chambers,acting as a scribe for Mila Merry, MD.,have documented all relevant documentation on the behalf of Mila Merry, MD,as directed by  Mila Merry, MD while in the presence of Mila Merry, MD.   Established patient visit   Patient: Kristen [name]   DOB: 1971-01-01   51 y.o. Female  MRN: [id number] Visit Date: 05/29/2022  Today's healthcare provider: Mila Merry, MD   Chief Complaint  Patient presents with   Cough   Subjective    Cough The current episode started 1 to 4 weeks ago. Cough characteristics: scant productive cough. Pertinent negatives include no chest pain, chills, fever or shortness of breath.    Patient was seen in the office on 05/16/2022 for acute cough and non seasonal allergic rhinitis. She was prescribed Flonase and amoxicillin (AMOXIL) 500 MG capsule; Take 2 capsules (1,000 mg total) by mouth 2 (two) times daily for 7 days. She states her sinus congestion and nasal drainage or much better, however she  reports still having a cough after completing all doses of antibiotics. She reports now having pain on the right lower ribs. Pain hurts with deep breathing, and  laying on right side and back.  Medications: Outpatient Medications Prior to Visit  Medication Sig   ALPRAZolam (XANAX) 0.5 MG tablet Take 1 tablet (0.5 mg total) by mouth 3 (three) times daily as needed for anxiety.   buPROPion (WELLBUTRIN XL) 150 MG 24 hr tablet Take 1 tablet (150 mg total) by mouth daily.   clonazePAM (KLONOPIN) 0.5 MG tablet 1-2 TABLETS AT BEDTIME AS NEEDED FOR FORSLEEP DISORDER   doxazosin (CARDURA) 2 MG tablet 1/2 tablet at night for 4 nights then 1 nightly   fluticasone (FLONASE) 50 MCG/ACT nasal spray Place 2 sprays into both nostrils daily.   fluvoxaMINE (LUVOX) 100 MG tablet Take 3 tablets (300 mg total) by mouth at bedtime.   lamoTRIgine (LAMICTAL) 200 MG tablet Take 1 tablet (200 mg total) by mouth daily.   levonorgestrel (MIRENA) 20 MCG/24HR IUD  by Intrauterine route.   Oxcarbazepine (TRILEPTAL) 300 MG tablet TAKE ONE TABLET EVERY MORNING AND TAKE TWO TABLETS AT BEDTIME   propranolol (INDERAL) 20 MG tablet TAKE ONE TO TWO TABLETS BY MOUTH 3 TIMESDAILY AS NEEDED FOR RESTLESSNESS   topiramate (TOPAMAX) 50 MG tablet TAKE ONE TABLET (50 MG) BY MOUTH EVERY MORNING AND TAKE TWO TABLETS (100 MG) BYMOUTH EVERY EVENING   XARELTO 10 MG TABS tablet TAKE ONE TABLET BY MOUTH EVERY DAY   No facility-administered medications prior to visit.    Review of Systems  Constitutional:  Negative for appetite change, chills, fatigue and fever.  Respiratory:  Positive for cough. Negative for chest tightness and shortness of breath.        Pain with deep breathing  Cardiovascular:  Negative for chest pain and palpitations.  Gastrointestinal:  Negative for abdominal pain, nausea and vomiting.  Neurological:  Negative for dizziness and weakness.       Objective    BP 107/65 (BP Location: Right Arm, Patient Position: Sitting)   Pulse 72   Temp 97.6 F (36.4 C) (Oral)   Resp 16   Wt 162 lb (73.5 kg)   SpO2 100% Comment: room air  BMI 23.92 kg/m    Physical Exam  General Appearance:    Well developed, well nourished female, alert, cooperative, in no acute distress  Eyes:    PERRL, conjunctiva/corneas clear, EOM's intact       Lungs:  Occasionally expiratory wheeze. , respirations unlabored  Heart:    Normal heart rate. Normal rhythm. No murmurs, rubs, or gallops.    Neurologic:   Awake, alert, oriented x 3. No apparent focal neurological           defect.        Assessment & Plan     1. Subacute cough Sinus symptoms resolved, but cough persists suggesting bronchitis.  - azithromycin (ZITHROMAX) 250 MG tablet; Take 2 tablets on day 1, then 1 tablet daily on days 2 through 5  Dispense: 6 tablet; Refill: 0 - HYDROcodone bit-homatropine (HYCODAN) 5-1.5 MG/5ML syrup; Take 5 mLs by mouth every 8 (eight) hours as needed for cough.  Dispense: 120  mL; Refill: 0   2. Intercostal muscle strain, initial encounter      The entirety of the information documented in the History of Present Illness, Review of Systems and Physical Exam were personally obtained by me. Portions of this information were initially documented by the CMA and reviewed by me for thoroughness and accuracy.     Mila Merry, MD  [facility] [phone] (phone) [phone] (fax)  [facility]

## 2022-06-06 ENCOUNTER — Encounter: Payer: Self-pay | Admitting: Oncology

## 2022-06-06 ENCOUNTER — Inpatient Hospital Stay: Payer: 59 | Attending: Oncology

## 2022-06-06 DIAGNOSIS — D508 Other iron deficiency anemias: Secondary | ICD-10-CM

## 2022-06-06 DIAGNOSIS — D509 Iron deficiency anemia, unspecified: Secondary | ICD-10-CM | POA: Insufficient documentation

## 2022-06-06 DIAGNOSIS — E538 Deficiency of other specified B group vitamins: Secondary | ICD-10-CM

## 2022-06-06 LAB — IRON AND TIBC
Iron: 19 ug/dL — ABNORMAL LOW (ref 28–170)
Saturation Ratios: 4 % — ABNORMAL LOW (ref 10.4–31.8)
TIBC: 461 ug/dL — ABNORMAL HIGH (ref 250–450)
UIBC: 442 ug/dL

## 2022-06-06 LAB — CBC WITH DIFFERENTIAL/PLATELET
Abs Immature Granulocytes: 0.01 10*3/uL (ref 0.00–0.07)
Basophils Absolute: 0 10*3/uL (ref 0.0–0.1)
Basophils Relative: 1 %
Eosinophils Absolute: 0.6 10*3/uL — ABNORMAL HIGH (ref 0.0–0.5)
Eosinophils Relative: 10 %
HCT: 32.6 % — ABNORMAL LOW (ref 36.0–46.0)
Hemoglobin: 9.7 g/dL — ABNORMAL LOW (ref 12.0–15.0)
Immature Granulocytes: 0 %
Lymphocytes Relative: 20 %
Lymphs Abs: 1.2 10*3/uL (ref 0.7–4.0)
MCH: 23.8 pg — ABNORMAL LOW (ref 26.0–34.0)
MCHC: 29.8 g/dL — ABNORMAL LOW (ref 30.0–36.0)
MCV: 80.1 fL (ref 80.0–100.0)
Monocytes Absolute: 0.6 10*3/uL (ref 0.1–1.0)
Monocytes Relative: 9 %
Neutro Abs: 3.5 10*3/uL (ref 1.7–7.7)
Neutrophils Relative %: 60 %
Platelets: 422 10*3/uL — ABNORMAL HIGH (ref 150–400)
RBC: 4.07 MIL/uL (ref 3.87–5.11)
RDW: 18.6 % — ABNORMAL HIGH (ref 11.5–15.5)
WBC: 5.8 10*3/uL (ref 4.0–10.5)
nRBC: 0 % (ref 0.0–0.2)

## 2022-06-07 ENCOUNTER — Telehealth: Payer: Self-pay | Admitting: *Deleted

## 2022-06-07 ENCOUNTER — Encounter: Payer: Self-pay | Admitting: *Deleted

## 2022-06-07 NOTE — Telephone Encounter (Signed)
Called and  snet my chart message about the iron levels low and and hemogloblin was 9.7 and Dr. Smith Robert suggested that she needs IV iron. If pt. Can call or send my chart message then we can get the iron infusion going after we see what iron the insurance will cover. Left my telephone number or my chart to get back with [place] with decision

## 2022-06-08 ENCOUNTER — Other Ambulatory Visit: Payer: Self-pay | Admitting: Family Medicine

## 2022-06-08 ENCOUNTER — Other Ambulatory Visit: Payer: Self-pay | Admitting: Psychiatry

## 2022-06-08 DIAGNOSIS — F411 Generalized anxiety disorder: Secondary | ICD-10-CM

## 2022-06-09 ENCOUNTER — Other Ambulatory Visit: Payer: Self-pay | Admitting: Oncology

## 2022-06-13 ENCOUNTER — Ambulatory Visit: Payer: 59 | Admitting: Psychiatry

## 2022-06-26 ENCOUNTER — Inpatient Hospital Stay: Payer: 59 | Attending: Oncology

## 2022-06-26 VITALS — BP 99/68 | HR 61 | Temp 96.3°F | Resp 20

## 2022-06-26 DIAGNOSIS — D509 Iron deficiency anemia, unspecified: Secondary | ICD-10-CM | POA: Insufficient documentation

## 2022-06-26 DIAGNOSIS — Z9884 Bariatric surgery status: Secondary | ICD-10-CM | POA: Diagnosis not present

## 2022-06-26 MED ORDER — SODIUM CHLORIDE 0.9 % IV SOLN
Freq: Once | INTRAVENOUS | Status: AC
  Filled 2022-06-26: qty 250

## 2022-06-26 MED ORDER — SODIUM CHLORIDE 0.9 % IV SOLN
200.0000 mg | INTRAVENOUS | Status: DC
  Administered 2022-06-26: 200 mg via INTRAVENOUS
  Filled 2022-06-26: qty 10

## 2022-07-10 ENCOUNTER — Inpatient Hospital Stay: Payer: 59

## 2022-07-10 VITALS — BP 92/65 | HR 66 | Temp 98.0°F | Resp 18

## 2022-07-10 DIAGNOSIS — D509 Iron deficiency anemia, unspecified: Secondary | ICD-10-CM | POA: Diagnosis not present

## 2022-07-10 MED ORDER — SODIUM CHLORIDE 0.9 % IV SOLN
200.0000 mg | INTRAVENOUS | Status: DC
  Administered 2022-07-10: 200 mg via INTRAVENOUS
  Filled 2022-07-10: qty 200

## 2022-07-10 MED ORDER — SODIUM CHLORIDE 0.9 % IV SOLN
Freq: Once | INTRAVENOUS | Status: AC
  Filled 2022-07-10: qty 250

## 2022-07-13 ENCOUNTER — Inpatient Hospital Stay: Payer: 59

## 2022-07-14 ENCOUNTER — Inpatient Hospital Stay: Payer: 59

## 2022-07-14 VITALS — BP 93/61 | HR 61 | Temp 97.6°F | Resp 18

## 2022-07-14 DIAGNOSIS — D509 Iron deficiency anemia, unspecified: Secondary | ICD-10-CM | POA: Diagnosis not present

## 2022-07-14 MED ORDER — SODIUM CHLORIDE 0.9 % IV SOLN
Freq: Once | INTRAVENOUS | Status: AC
  Filled 2022-07-14: qty 250

## 2022-07-14 MED ORDER — SODIUM CHLORIDE 0.9 % IV SOLN
200.0000 mg | INTRAVENOUS | Status: DC
  Administered 2022-07-14: 200 mg via INTRAVENOUS
  Filled 2022-07-14: qty 200

## 2022-07-14 NOTE — Patient Instructions (Signed)

## 2022-07-19 ENCOUNTER — Inpatient Hospital Stay: Payer: 59 | Attending: Oncology

## 2022-07-19 VITALS — BP 107/73 | HR 60 | Temp 98.9°F | Resp 17

## 2022-07-19 DIAGNOSIS — D509 Iron deficiency anemia, unspecified: Secondary | ICD-10-CM | POA: Diagnosis present

## 2022-07-19 MED ORDER — SODIUM CHLORIDE 0.9 % IV SOLN
Freq: Once | INTRAVENOUS | Status: AC
  Filled 2022-07-19: qty 250

## 2022-07-19 MED ORDER — SODIUM CHLORIDE 0.9 % IV SOLN
200.0000 mg | INTRAVENOUS | Status: DC
  Administered 2022-07-19: 200 mg via INTRAVENOUS
  Filled 2022-07-19: qty 200

## 2022-07-19 NOTE — Patient Instructions (Signed)

## 2022-07-20 MED FILL — Iron Sucrose Inj 20 MG/ML (Fe Equiv): INTRAVENOUS | Qty: 10 | Status: AC

## 2022-07-21 ENCOUNTER — Inpatient Hospital Stay: Payer: 59

## 2022-07-21 VITALS — BP 112/70 | HR 51 | Temp 97.0°F | Resp 17

## 2022-07-21 DIAGNOSIS — D509 Iron deficiency anemia, unspecified: Secondary | ICD-10-CM

## 2022-07-21 MED ORDER — SODIUM CHLORIDE 0.9 % IV SOLN
Freq: Once | INTRAVENOUS | Status: AC
  Filled 2022-07-21: qty 250

## 2022-07-21 MED ORDER — SODIUM CHLORIDE 0.9 % IV SOLN
200.0000 mg | INTRAVENOUS | Status: DC
  Administered 2022-07-21: 200 mg via INTRAVENOUS
  Filled 2022-07-21: qty 200

## 2022-07-21 NOTE — Patient Instructions (Signed)

## 2022-07-24 ENCOUNTER — Other Ambulatory Visit: Payer: Self-pay | Admitting: Psychiatry

## 2022-07-24 DIAGNOSIS — F319 Bipolar disorder, unspecified: Secondary | ICD-10-CM

## 2022-09-12 ENCOUNTER — Other Ambulatory Visit: Payer: Self-pay

## 2022-09-12 DIAGNOSIS — D509 Iron deficiency anemia, unspecified: Secondary | ICD-10-CM

## 2022-09-13 ENCOUNTER — Inpatient Hospital Stay: Payer: 59

## 2022-09-13 ENCOUNTER — Inpatient Hospital Stay: Payer: 59 | Admitting: Oncology

## 2022-09-27 ENCOUNTER — Other Ambulatory Visit: Payer: Self-pay | Admitting: Psychiatry

## 2022-09-27 DIAGNOSIS — G4752 REM sleep behavior disorder: Secondary | ICD-10-CM

## 2022-10-05 ENCOUNTER — Other Ambulatory Visit: Payer: Self-pay | Admitting: Psychiatry

## 2022-10-05 DIAGNOSIS — F319 Bipolar disorder, unspecified: Secondary | ICD-10-CM

## 2022-10-05 DIAGNOSIS — F422 Mixed obsessional thoughts and acts: Secondary | ICD-10-CM

## 2022-10-17 ENCOUNTER — Inpatient Hospital Stay: Payer: 59 | Attending: Oncology

## 2022-10-17 ENCOUNTER — Inpatient Hospital Stay: Payer: 59 | Admitting: Oncology

## 2022-10-19 ENCOUNTER — Encounter: Payer: Self-pay | Admitting: Oncology

## 2022-10-23 ENCOUNTER — Other Ambulatory Visit: Payer: Self-pay | Admitting: Psychiatry

## 2022-10-23 DIAGNOSIS — F319 Bipolar disorder, unspecified: Secondary | ICD-10-CM

## 2022-11-02 ENCOUNTER — Other Ambulatory Visit: Payer: Self-pay | Admitting: Psychiatry

## 2022-11-02 DIAGNOSIS — F319 Bipolar disorder, unspecified: Secondary | ICD-10-CM

## 2022-11-15 ENCOUNTER — Other Ambulatory Visit: Payer: Self-pay | Admitting: Psychiatry

## 2022-11-15 DIAGNOSIS — G4752 REM sleep behavior disorder: Secondary | ICD-10-CM

## 2022-11-22 NOTE — Discharge Instructions (Signed)
[facility] ENDOSCOPIC SINUS SURGERY [facility]  What is Functional Endoscopic Sinus Surgery?  The Surgery involves making the natural openings of the sinuses larger by removing the bony partitions that separate the sinuses from the nasal cavity.  The natural sinus lining is preserved as much as possible to allow the sinuses to resume normal function after the surgery.  In some patients nasal polyps (excessively swollen lining of the sinuses) may be removed to relieve obstruction of the sinus openings.  The surgery is performed through the nose using lighted scopes, which eliminates the need for incisions on the face.  A septoplasty is a different procedure which is sometimes performed with sinus surgery.  It involves straightening the boy partition that separates the two sides of your nose.  A crooked or deviated septum may need repair if is obstructing the sinuses or nasal airflow.  Turbinate reduction is also often performed during sinus surgery.  The turbinates are bony proturberances from the side walls of the nose which swell and can obstruct the nose in patients with sinus and allergy problems.  Their size can be surgically reduced to help relieve nasal obstruction.  What Can Sinus Surgery Do For Me?  Sinus surgery can reduce the frequency of sinus infections requiring antibiotic treatment.  This can provide improvement in nasal congestion, post-nasal drainage, facial pressure and nasal obstruction.  Surgery will NOT prevent you from ever having an infection again, so it usually only for patients who get infections 4 or more times yearly requiring antibiotics, or for infections that do not clear with antibiotics.  It will not cure nasal allergies, so patients with allergies may still require medication to treat their allergies after surgery. Surgery may improve headaches related to sinusitis, however, some people will continue to  require medication to control sinus headaches related to allergies.  Surgery will do nothing for other forms of headache (migraine, tension or cluster).  What Are the Risks of Endoscopic Sinus Surgery?  Current techniques allow surgery to be performed safely with little risk, however, there are rare complications that patients should be aware of.  Because the sinuses are located around the eyes, there is risk of eye injury, including blindness, though again, this would be quite rare. This is usually a result of bleeding behind the eye during surgery, which can effect vision, though there are treatments to protect the vision and prevent permanent injury. More serious complications would include bleeding inside the brain cavity or damage to the brain.This happens when the fluid around the brain leaks out into the sinus cavity.  Again, all of these complications are uncommon, and spinal fluid leaks can be safely managed surgically if they occur.  The most common complication of sinus surgery is bleeding from the nose, which may require packing or cauterization of the nose.  Patients with polyps may experience recurrence of the polyps that would require revision surgery.  Alterations of sense of smell or injury to the tear ducts are also rare complications.   What is the Surgery Like, and what is the Recovery?  The Surgery usually takes a couple of hours to perform, and is usually performed under a general anesthetic (completely asleep).  Patients are usually discharged home after a couple of hours.  Sometimes during surgery it is necessary to pack the nose to control bleeding, and the packing is left in place for 24 - 48 hours, and removed by your surgeon.  If   a septoplasty was performed during the procedure, there is often a splint placed which must be removed after 5-7 days.   Discomfort: Pain is usually mild to moderate, and can be controlled by prescription pain medication or acetaminophen (Tylenol).   Aspirin, Ibuprofen (Advil, Motrin), or Naprosyn (Aleve) should be avoided, as they can cause increased bleeding.  Most patients feel sinus pressure like they have a bad head cold for several days.  Sleeping with your head elevated can help reduce swelling and facial pressure, as can ice packs over the face.  A humidifier may be helpful to keep the mucous and blood from drying in the nose.   Diet: There are no specific diet restrictions, however, you should generally start with clear liquids and a light diet of bland foods because the anesthetic can cause some nausea.  Advance your diet depending on how your stomach feels.  Taking your pain medication with food will often help reduce stomach upset which pain medications can cause.  Nasal Saline Irrigation: It is important to remove blood clots and dried mucous from the nose as it is healing.  This is done by having you irrigate the nose at least 3 - 4 times daily with a salt water solution.  We recommend using NeilMed Sinus Rinse (available at the drug store).  Fill the squeeze bottle with the solution, bend over a sink, and insert the tip of the squeeze bottle into the nose  of an inch.  Point the tip of the squeeze bottle towards the inside corner of the eye on the same side your irrigating.  Squeeze the bottle and gently irrigate the nose.  If you bend forward as you do this, most of the fluid will flow back out of the nose, instead of down your throat.   The solution should be warm, near body temperature, when you irrigate.   Each time you irrigate, you should use a full squeeze bottle.   Note that if you are instructed to use Nasal Steroid Sprays at any time after your surgery, irrigate with saline BEFORE using the steroid spray, so you do not wash it all out of the nose. Another product, Nasal Saline Gel (such as AYR Nasal Saline Gel) can be applied in each nostril 3 - 4 times daily to moisture the nose and reduce scabbing or crusting.  Bleeding:   Bloody drainage from the nose can be expected for several days, and patients are instructed to irrigate their nose frequently with salt water to help remove mucous and blood clots.  The drainage may be dark red or brown, though some fresh blood may be seen intermittently, especially after irrigation.  Do not blow you nose, as bleeding may occur. If you must sneeze, keep your mouth open to allow air to escape through your mouth.  If heavy bleeding occurs: Irrigate the nose with saline to rinse out clots, then spray the nose 3 - 4 times with Afrin Nasal Decongestant Spray.  The spray will constrict the blood vessels to slow bleeding.  Pinch the lower half of your nose shut to apply pressure, and lay down with your head elevated.  Ice packs over the nose may help as well. If bleeding persists despite these measures, you should notify your doctor.  Do not use the Afrin routinely to control nasal congestion after surgery, as it can result in worsening congestion and may affect healing.     Activity: Return to work varies among patients. Most patients will be out   of work at least 5 - 7 days to recover.  Patient may return to work after they are off of narcotic pain medication, and feeling well enough to perform the functions of their job.  Patients must avoid heavy lifting (over 10 pounds) or strenuous physical for 2 weeks after surgery, so your employer may need to assign you to light duty, or keep you out of work longer if light duty is not possible.  NOTE: you should not drive, operate dangerous machinery, do any mentally demanding tasks or make any important legal or financial decisions while on narcotic pain medication and recovering from the general anesthetic.    Call Your Doctor Immediately if You Have Any of the Following: Bleeding that you cannot control with the above measures Loss of vision, double vision, bulging of the eye or black eyes. Fever over 101 degrees Neck stiffness with severe headache,  fever, nausea and change in mental state. You are always encouraged to call anytime with concerns, however, please call with requests for pain medication refills during office hours.  Office Endoscopy: During follow-up visits your doctor will remove any packing or splints that may have been placed and evaluate and clean your sinuses endoscopically.  Topical anesthetic will be used to make this as comfortable as possible, though you may want to take your pain medication prior to the visit.  How often this will need to be done varies from patient to patient.  After complete recovery from the surgery, you may need follow-up endoscopy from time to time, particularly if there is concern of recurrent infection or nasal polyps.  

## 2022-11-24 ENCOUNTER — Encounter: Payer: Self-pay | Admitting: Oncology

## 2022-11-28 ENCOUNTER — Encounter: Payer: Self-pay | Admitting: Otolaryngology

## 2022-11-29 NOTE — Anesthesia Preprocedure Evaluation (Addendum)
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   Endo/Other  negative endocrine ROS    Renal/GU      Musculoskeletal   Abdominal   Peds  Hematology  (+) Blood dyscrasia, anemia IDA in setting of gastric bypass   Anesthesia Other Findings Past Medical History: No date: Anemia No date: Anxiety No date: Bipolar disorder (HCC) No date: Cholelithiasis No date: DVT (deep venous thrombosis) (HCC) No date: GERD (gastroesophageal reflux disease) No date: Kidney stones No date: PONV (postoperative nausea and vomiting) No date: Pulmonary embolism (HCC) No date: RLS (restless legs syndrome)  Past Surgical History: 2013/2014: BREAST BIOPSY; Bilateral     Comment:  neg 2007: CERVICAL BIOPSY  W/ LOOP ELECTRODE EXCISION No date: CHOLECYSTECTOMY 04/03/2017: COLONOSCOPY WITH PROPOFOL; N/A     Comment:  Procedure: COLONOSCOPY WITH PROPOFOL;  Surgeon: Wyline Mood, MD;  Location: [facility] ENDOSCOPY;  Service: Endoscopy;              Laterality: N/A; 04/03/2017: ESOPHAGOGASTRODUODENOSCOPY (EGD) WITH PROPOFOL; N/A     Comment:  Procedure: ESOPHAGOGASTRODUODENOSCOPY (EGD) WITH               PROPOFOL;  Surgeon: Wyline Mood, MD;  Location: [facility]               ENDOSCOPY;  Service: Endoscopy;  Laterality: N/A; about 2010: GASTRIC BYPASS     Comment:  initially lost 158 pounds No  date: HERNIA REPAIR 11/08/2009: IVC FILTER INSERTION     Comment:  Dorothea Ogle MD [facility], [place], [place] No date: kidney stone removal No date: KNEE ARTHROSCOPY No date: TONSILLECTOMY  BMI    Body Mass Index: 23.48 kg/m      Reproductive/Obstetrics negative OB ROS                             Anesthesia Physical Anesthesia Plan  ASA: 2  Anesthesia Plan: General ETT and General   Post-op Pain Management: Tylenol PO (pre-op) and Toradol IV (intra-op)   Induction: Intravenous  PONV Risk Score and Plan: 3 and Ondansetron, Dexamethasone, Midazolam, Droperidol and Propofol infusion  Airway Management Planned: Oral ETT  Additional Equipment:   Intra-op Plan:   Post-operative Plan:   Informed Consent:      Dental Advisory Given  Plan Discussed with: Anesthesiologist, CRNA and Surgeon  Anesthesia Plan Comments:         Anesthesia Quick Evaluation

## 2022-11-30 ENCOUNTER — Encounter
Admission: RE | Disposition: A | Discharge status: Home / Self Care | Payer: Self-pay | Source: Ambulatory Visit | Attending: Otolaryngology

## 2022-11-30 ENCOUNTER — Ambulatory Visit: Payer: 59 | Admitting: Anesthesiology

## 2022-11-30 ENCOUNTER — Ambulatory Visit
Admission: RE | Admit: 2022-11-30 | Discharge: 2022-11-30 | Disposition: A | Discharge status: Home / Self Care | Payer: 59 | Source: Ambulatory Visit | Attending: Otolaryngology | Admitting: Otolaryngology

## 2022-11-30 ENCOUNTER — Other Ambulatory Visit: Payer: Self-pay

## 2022-11-30 ENCOUNTER — Encounter: Payer: Self-pay | Admitting: Otolaryngology

## 2022-11-30 DIAGNOSIS — K219 Gastro-esophageal reflux disease without esophagitis: Secondary | ICD-10-CM | POA: Diagnosis not present

## 2022-11-30 DIAGNOSIS — J342 Deviated nasal septum: Secondary | ICD-10-CM | POA: Diagnosis present

## 2022-11-30 DIAGNOSIS — F319 Bipolar disorder, unspecified: Secondary | ICD-10-CM | POA: Diagnosis not present

## 2022-11-30 DIAGNOSIS — D649 Anemia, unspecified: Secondary | ICD-10-CM | POA: Diagnosis not present

## 2022-11-30 DIAGNOSIS — Z86711 Personal history of pulmonary embolism: Secondary | ICD-10-CM | POA: Insufficient documentation

## 2022-11-30 DIAGNOSIS — J320 Chronic maxillary sinusitis: Secondary | ICD-10-CM | POA: Diagnosis not present

## 2022-11-30 DIAGNOSIS — Z7901 Long term (current) use of anticoagulants: Secondary | ICD-10-CM | POA: Insufficient documentation

## 2022-11-30 DIAGNOSIS — J343 Hypertrophy of nasal turbinates: Secondary | ICD-10-CM | POA: Diagnosis not present

## 2022-11-30 DIAGNOSIS — Z9884 Bariatric surgery status: Secondary | ICD-10-CM | POA: Insufficient documentation

## 2022-11-30 HISTORY — PX: IMAGE GUIDED SINUS SURGERY: SHX6570

## 2022-11-30 HISTORY — PX: MAXILLARY ANTROSTOMY: SHX2003

## 2022-11-30 HISTORY — DX: Other specified postprocedural states: Z98.890

## 2022-11-30 HISTORY — PX: NASAL SEPTOPLASTY W/ TURBINOPLASTY: SHX2070

## 2022-11-30 SURGERY — SINUS SURGERY, WITH IMAGING GUIDANCE
Anesthesia: General | Laterality: Bilateral

## 2022-11-30 MED ORDER — LACTATED RINGERS IV SOLN: INTRAVENOUS | Status: DC

## 2022-11-30 MED ORDER — HYDROCODONE-ACETAMINOPHEN 5-325 MG PO TABS: 1.0000 | ORAL_TABLET | Freq: Four times a day (QID) | ORAL | 0 refills | Status: AC | PRN

## 2022-11-30 MED ORDER — EPHEDRINE SULFATE (PRESSORS) 50 MG/ML IJ SOLN
INTRAMUSCULAR | Status: DC | PRN
  Administered 2022-11-30 (×2): 5 mg via INTRAVENOUS

## 2022-11-30 MED ORDER — FENTANYL CITRATE (PF) 100 MCG/2ML IJ SOLN
INTRAMUSCULAR | Status: DC | PRN
  Administered 2022-11-30 (×3): 50 ug via INTRAVENOUS

## 2022-11-30 MED ORDER — PREDNISONE 10 MG PO TABS: ORAL_TABLET | ORAL | 0 refills | Status: DC

## 2022-11-30 MED ORDER — DROPERIDOL 2.5 MG/ML IJ SOLN: 0.6250 mg | Freq: Once | INTRAMUSCULAR | Status: DC | PRN

## 2022-11-30 MED ORDER — ONDANSETRON HCL 4 MG/2ML IJ SOLN
INTRAMUSCULAR | Status: DC | PRN
  Administered 2022-11-30: 4 mg via INTRAVENOUS

## 2022-11-30 MED ORDER — PHENYLEPHRINE HCL 0.5 % NA SOLN
NASAL | Status: DC | PRN
  Administered 2022-11-30: 3 mL via NASAL

## 2022-11-30 MED ORDER — LIDOCAINE-EPINEPHRINE 1 %-1:100000 IJ SOLN
INTRAMUSCULAR | Status: DC | PRN
  Administered 2022-11-30: 5 mL
  Administered 2022-11-30: 1.5 mL

## 2022-11-30 MED ORDER — PROPOFOL 10 MG/ML IV BOLUS
INTRAVENOUS | Status: DC | PRN
  Administered 2022-11-30: 50 mg via INTRAVENOUS
  Administered 2022-11-30: 200 mg via INTRAVENOUS

## 2022-11-30 MED ORDER — DEXTROSE 5 % IV SOLN
2000.0000 mg | Freq: Once | INTRAVENOUS | Status: AC
  Administered 2022-11-30: 2000 mg via INTRAVENOUS

## 2022-11-30 MED ORDER — OXYCODONE HCL 5 MG PO TABS: 5.0000 mg | ORAL_TABLET | Freq: Once | ORAL | Status: AC | PRN

## 2022-11-30 MED ORDER — DEXAMETHASONE SODIUM PHOSPHATE 4 MG/ML IJ SOLN
INTRAMUSCULAR | Status: DC | PRN
  Administered 2022-11-30: 8 mg via INTRAVENOUS

## 2022-11-30 MED ORDER — OXYCODONE HCL 5 MG/5ML PO SOLN
5.0000 mg | Freq: Once | ORAL | Status: AC | PRN
  Administered 2022-11-30: 5 mg via ORAL

## 2022-11-30 MED ORDER — MIDAZOLAM HCL 5 MG/5ML IJ SOLN
INTRAMUSCULAR | Status: DC | PRN
  Administered 2022-11-30 (×2): 1 mg via INTRAVENOUS

## 2022-11-30 MED ORDER — SUGAMMADEX SODIUM 200 MG/2ML IV SOLN
INTRAVENOUS | Status: DC | PRN
  Administered 2022-11-30: 200 mg via INTRAVENOUS

## 2022-11-30 MED ORDER — OXYMETAZOLINE HCL 0.05 % NA SOLN
2.0000 | Freq: Once | NASAL | Status: AC
  Administered 2022-11-30: 2 via NASAL

## 2022-11-30 MED ORDER — FENTANYL CITRATE PF 50 MCG/ML IJ SOSY: 25.0000 ug | PREFILLED_SYRINGE | INTRAMUSCULAR | Status: DC | PRN

## 2022-11-30 MED ORDER — DEXMEDETOMIDINE HCL IN NACL 80 MCG/20ML IV SOLN
INTRAVENOUS | Status: DC | PRN
  Administered 2022-11-30: 8 ug via BUCCAL
  Administered 2022-11-30: 12 ug via BUCCAL

## 2022-11-30 MED ORDER — ACETAMINOPHEN 500 MG PO TABS
1000.0000 mg | ORAL_TABLET | Freq: Once | ORAL | Status: AC
  Administered 2022-11-30: 1000 mg via ORAL

## 2022-11-30 MED ORDER — CEPHALEXIN 500 MG PO CAPS: 500.0000 mg | ORAL_CAPSULE | Freq: Two times a day (BID) | ORAL | 0 refills | Status: DC

## 2022-11-30 MED ORDER — PROMETHAZINE HCL 25 MG/ML IJ SOLN: 6.2500 mg | INTRAMUSCULAR | Status: DC | PRN

## 2022-11-30 MED ORDER — LIDOCAINE HCL (CARDIAC) PF 100 MG/5ML IV SOSY
PREFILLED_SYRINGE | INTRAVENOUS | Status: DC | PRN
  Administered 2022-11-30: 60 mg via INTRAVENOUS

## 2022-11-30 MED ORDER — ROCURONIUM BROMIDE 100 MG/10ML IV SOLN
INTRAVENOUS | Status: DC | PRN
  Administered 2022-11-30: 10 mg via INTRAVENOUS
  Administered 2022-11-30: 40 mg via INTRAVENOUS
  Administered 2022-11-30: 30 mg via INTRAVENOUS

## 2022-11-30 SURGICAL SUPPLY — 42 items
BLADE SHAVER DIEGO 4 40D (BLADE) IMPLANT
BLADE SHAVER TRUDI 4 15 DEG (ENT DISPOSABLE) IMPLANT
BLADE SHAVER TRUDI STR 4 (ENT DISPOSABLE) ×1 IMPLANT
CABLE TRUDI DISPOSABLE (ENT DISPOSABLE) ×2 IMPLANT
CANISTER SUCT 1200ML W/VALVE (MISCELLANEOUS) ×1 IMPLANT
CATH IV 18X1 1/4 SAFELET (CATHETERS) ×1 IMPLANT
COAGULATOR SUCT 8FR VV (MISCELLANEOUS) ×1 IMPLANT
DEVICE INFLATION SEID (MISCELLANEOUS) IMPLANT
ELECT REM PT RETURN 9FT ADLT (ELECTROSURGICAL) ×1
ELECTRODE REM PT RTRN 9FT ADLT (ELECTROSURGICAL) ×1 IMPLANT
GLOVE SURG GAMMEX PI TX LF 7.5 (GLOVE) ×2 IMPLANT
GOWN STRL REUS W/ TWL LRG LVL3 (GOWN DISPOSABLE) ×1 IMPLANT
GOWN STRL REUS W/TWL LRG LVL3 (GOWN DISPOSABLE) ×1
IV CATH 18X1 1/4 SAFELET (CATHETERS) ×1
IV NS 500ML (IV SOLUTION) ×1
IV NS 500ML BAXH (IV SOLUTION) ×1 IMPLANT
KIT TURNOVER KIT A (KITS) ×1 IMPLANT
NDL ANESTHESIA 27G X 3.5 (NEEDLE) ×1 IMPLANT
NDL HYPO 27GX1-1/4 (NEEDLE) ×1 IMPLANT
NEEDLE ANESTHESIA  27G X 3.5 (NEEDLE) ×1
NEEDLE ANESTHESIA 27G X 3.5 (NEEDLE) ×1 IMPLANT
NEEDLE HYPO 27GX1-1/4 (NEEDLE) ×1 IMPLANT
NS IRRIG 500ML POUR BTL (IV SOLUTION) ×1 IMPLANT
PACK ENT CUSTOM (PACKS) ×1 IMPLANT
PACKING NASAL EPIS 4X2.4 XEROG (MISCELLANEOUS) ×2 IMPLANT
PATTIES SURGICAL .5 X3 (DISPOSABLE) ×1 IMPLANT
SHAVER DIEGO BLD STD TYPE A (BLADE) IMPLANT
SOL ANTI-FOG 6CC FOG-OUT (MISCELLANEOUS) ×1 IMPLANT
SPLINT NASAL SEPTAL BLV .50 ST (MISCELLANEOUS) ×1 IMPLANT
STRAP BODY AND KNEE 60X3 (MISCELLANEOUS) ×1 IMPLANT
SUT CHROMIC 3-0 (SUTURE) ×1
SUT CHROMIC 3-0 KS 27XMFL CR (SUTURE) ×1
SUT ETHILON 3-0 KS 30 BLK (SUTURE) ×1 IMPLANT
SUT PLAIN GUT 4-0 (SUTURE) ×1 IMPLANT
SUTURE CHRMC 3-0 KS 27XMFL CR (SUTURE) ×1 IMPLANT
SYR 3ML LL SCALE MARK (SYRINGE) ×1 IMPLANT
SYSTEM BALLOON SINUPLASTY 6X16 (SINUPLASTY) IMPLANT
TOWEL OR 17X26 4PK STRL BLUE (TOWEL DISPOSABLE) ×1 IMPLANT
TRACKER DISPOSABLE PAITIENT (MISCELLANEOUS) ×1 IMPLANT
TUBING DECLOG MULTIDEBRIDER (TUBING) IMPLANT
TUBING IRRIGATION BIEN-AIR (TUBING) ×1 IMPLANT
WATER STERILE IRR 250ML POUR (IV SOLUTION) ×1 IMPLANT

## 2022-11-30 NOTE — Op Note (Signed)
11/30/2022  11:32 AM  [id number]   Pre-Op Dx:  Deviated Nasal Septum, Hypertrophic Inferior Turbinates, chronic right maxillary sinusitis  Post-op Dx: Same  Proc: Nasal Septoplasty, right endoscopic maxillary antrostomy with removal of contents, bilateral Partial Reduction Inferior Turbinates, use of image guided system  Surg:  Beverly Sessions Farryn Linares  Anes:  GOT  EBL: 50 mL  Comp: None  Findings: Septum markedly deviated to the left side blocking the airway there.  The inferior turbinates were enlarged.  The right maxillary sinus had a mustard colored paste that was along the medial wall and inferior wall that had to be cleaned out.  Procedure: With the patient in a comfortable supine position,  general orotracheal anesthesia was induced without difficulty.     The patient received preoperative Afrin spray for topical decongestion and vasoconstriction.  Intravenous prophylactic antibiotics were administered.  The image guided system was brought in and the CT scan was downloaded from the disc.  The patient was placed on the head plate with a forehead marker attached.  The face was then registered to the system.  There seem to be good alignment with the CT scan checking points.  At an appropriate level, the patient was placed in a semi-sitting position.  Nasal vibrissae were trimmed.   1% Xylocaine with 1:100,000 epinephrine, 5 cc's, was infiltrated into the anterior floor of the nose, into the nasal spine region, into the membranous columella, and finally into the submucoperichondrial plane of the septum on both sides.  Several minutes were allowed for this to take effect.  Cottoniod pledgetts soaked in Afrin and 4% Xylocaine were placed into both nasal cavities and left while the patient was prepped and draped in the standard fashion.  The materials were removed from the nose and observed to be intact and correct in number.  The nose was inspected with a headlight and zero degree scope with the  findings as described above.  A left Killian incision was sharply executed and carried down to the quadrangular cartilage. The mucoperichondrium was elelvated along the quadrangular plate back to the bony-cartilaginous junction. The mucoperiostium was then elevated along the ethmoid plate and the vomer. The boney-catilaginous junction was then split with a freer elevator and the mucoperiosteum was elevated on the opposite side. The mucoperiosteum was then elevated along the maxillary crest as needed to expose the crooked bone of the crest.  Boney spurs of the vomer and maxillary crest were removed with Lenoria Chime forceps.  The cartilaginous plate was trimmed along its posterior and inferior borders of about 2 mm of cartilage to free it up inferiorly. Some of the deviated ethmoid plate was then fractured and removed with Takahashi forceps to free up the posterior border of the quadrangular plate and allow it to swing back to the midline. The mucosal flaps were placed back into their anatomic position to allow visualization of the airways. The septum now sat in the midline with an improved airway.  A 3-0 Chromic suture on a Keith needle in used to anchor the inferior septum at the nasal spine with a through and through suture. The mucosal flaps are then sutured together using a through and through whip stitch of 4-0 Plain Gut with a mini-Keith needle. This was used to close the [place] incision as well.   The inferior turbinates were then inspected. An incision was created along the inferior aspect of the left inferior turbinate with removal of some of the inferior soft tissue and bone. Electrocautery was used to  control bleeding in the area. The remaining turbinate was then outfractured to open up the airway further. There was no significant bleeding noted. The right turbinate was then trimmed and outfractured in a similar fashion.  A 0 degree scope was used to visualize the right nasal cavity.  The middle  turbinate had 1/2 mL of 1% Xylocaine with epi 1: 100,000 infiltrated into the anterior and posterior roots of the middle turbinate.  Middle turbinate was then infractured to provide better access to the middle meatus.  A side biter was used to incise the uncinate process and the microdebrider was used for trimming up the raw edges.  This was done with the 0 degree scope.  Once the natural ostium was visible 1/3 degree scope was used to visualize this better.  A side biter and through biting straight forceps were used to widen the natural ostium posteriorly and inferiorly.  This then provided better access in the maxillary sinus.  The posterior and lateral walls look pretty good but the medial wall and inferior portion had a lot of mustard yellow-colored that was filling the sinus.  I used the back angled forceps to grab some of this but it was so soft I used a curved suction to then suction most of this out.  I flushed it some cleans more pieces out and suction it so that there is no further paste evident in the maxillary sinus using the scope.  Once this was cleaned out the entire uncinate process was removed the edges were smoothed out and xerogel was then placed into the right maxillary sinus opening.  The 0 degree scope was used on the left side and the middle turbinate was infractured as this had been lateralized from the deviated septum.  It was held medially using some xerogel in the middle meatus as well.  The airways were then visualized and showed open passageways on both sides that were significantly improved compared to before surgery. There was no signifcant bleeding. Nasal splints were applied to both sides of the septum using Xomed 0.23mm regular sized splints that were trimmed, and then held in position with a 3-0 Nylon through and through suture.  The patient was turned back over to anesthesia, and awakened, extubated, and taken to the PACU in satisfactory condition.  Dispo:   PACU to  home  Plan: Ice, elevation, narcotic analgesia, steroid taper, and prophylactic antibiotics for the duration of indwelling nasal foreign bodies.  We will reevaluate the patient in the office in 6 days and remove the septal splints.  Return to work in 10 days, strenuous activities in two weeks.   Beverly Sessions Dawnmarie Breon 11/30/2022 11:32 AM

## 2022-11-30 NOTE — Transfer of Care (Signed)
Immediate Anesthesia Transfer of Care Note  Patient: Kristen [name]  Procedure(s) Performed: IMAGE GUIDED SINUS SURGERY (Bilateral) NASAL SEPTOPLASTY WITH INFERIOR TURBINATE REDUCTION (Bilateral) MAXILLARY ANTROSTOMY with tissue remvoal (Bilateral)  Patient Location: PACU  Anesthesia Type: General ETT, General  Level of Consciousness: awake, alert  and patient cooperative  Airway and Oxygen Therapy: Patient Spontanous Breathing and Patient connected to supplemental oxygen  Post-op Assessment: Post-op Vital signs reviewed, Patient's Cardiovascular Status Stable, Respiratory Function Stable, Patent Airway and No signs of Nausea or vomiting  Post-op Vital Signs: Reviewed and stable  Complications: No notable events documented.

## 2022-11-30 NOTE — H&P (Signed)
H&P has been reviewed and patient reevaluated, no changes necessary. To be downloaded later.  

## 2022-11-30 NOTE — Anesthesia Postprocedure Evaluation (Signed)
Anesthesia Post Note  Patient: Kristen [name]  Procedure(s) Performed: IMAGE GUIDED SINUS SURGERY (Bilateral) NASAL SEPTOPLASTY WITH INFERIOR TURBINATE REDUCTION (Bilateral) MAXILLARY ANTROSTOMY with tissue remvoal (Bilateral)  Patient location during evaluation: PACU Anesthesia Type: General Level of consciousness: awake and alert Pain management: pain level controlled Vital Signs Assessment: post-procedure vital signs reviewed and stable Respiratory status: spontaneous breathing, nonlabored ventilation and respiratory function stable Cardiovascular status: blood pressure returned to baseline and stable Postop Assessment: no apparent nausea or vomiting Anesthetic complications: no   There were no known notable events for this encounter.   Last Vitals:  Vitals:   11/30/22 1200 11/30/22 1208  BP: 113/75 118/76  Pulse: 96 97  Resp: 14 13  Temp:  36.7 C  SpO2: 94% 95%    Last Pain:  Vitals:   11/30/22 1208  TempSrc:   PainSc: 5                  Foye Deer

## 2022-12-01 ENCOUNTER — Encounter: Payer: Self-pay | Admitting: Otolaryngology

## 2022-12-05 LAB — SURGICAL PATHOLOGY

## 2022-12-07 ENCOUNTER — Other Ambulatory Visit: Payer: Self-pay | Admitting: Psychiatry

## 2022-12-07 DIAGNOSIS — G4752 REM sleep behavior disorder: Secondary | ICD-10-CM

## 2022-12-08 NOTE — Telephone Encounter (Signed)
Filled 11/29 changed fill date to 12/27

## 2022-12-21 ENCOUNTER — Encounter: Payer: Self-pay | Admitting: Oncology

## 2022-12-28 ENCOUNTER — Ambulatory Visit: Payer: 59 | Admitting: Psychiatry

## 2023-01-08 ENCOUNTER — Encounter: Payer: Self-pay | Admitting: Oncology

## 2023-01-08 ENCOUNTER — Encounter: Payer: Self-pay | Admitting: Psychiatry

## 2023-01-08 ENCOUNTER — Ambulatory Visit (INDEPENDENT_AMBULATORY_CARE_PROVIDER_SITE_OTHER): Payer: 59 | Admitting: Psychiatry

## 2023-01-08 DIAGNOSIS — E538 Deficiency of other specified B group vitamins: Secondary | ICD-10-CM

## 2023-01-08 DIAGNOSIS — F422 Mixed obsessional thoughts and acts: Secondary | ICD-10-CM | POA: Diagnosis not present

## 2023-01-08 DIAGNOSIS — F319 Bipolar disorder, unspecified: Secondary | ICD-10-CM | POA: Diagnosis not present

## 2023-01-08 DIAGNOSIS — F515 Nightmare disorder: Secondary | ICD-10-CM

## 2023-01-08 DIAGNOSIS — E611 Iron deficiency: Secondary | ICD-10-CM

## 2023-01-08 DIAGNOSIS — G43009 Migraine without aura, not intractable, without status migrainosus: Secondary | ICD-10-CM

## 2023-01-08 DIAGNOSIS — R7989 Other specified abnormal findings of blood chemistry: Secondary | ICD-10-CM

## 2023-01-08 DIAGNOSIS — F411 Generalized anxiety disorder: Secondary | ICD-10-CM

## 2023-01-08 DIAGNOSIS — G2581 Restless legs syndrome: Secondary | ICD-10-CM

## 2023-01-08 DIAGNOSIS — Z9884 Bariatric surgery status: Secondary | ICD-10-CM

## 2023-01-08 DIAGNOSIS — F4001 Agoraphobia with panic disorder: Secondary | ICD-10-CM | POA: Diagnosis not present

## 2023-01-08 DIAGNOSIS — G4752 REM sleep behavior disorder: Secondary | ICD-10-CM

## 2023-01-08 DIAGNOSIS — F5105 Insomnia due to other mental disorder: Secondary | ICD-10-CM

## 2023-01-08 MED ORDER — LURASIDONE HCL 80 MG PO TABS: 80.0000 mg | ORAL_TABLET | Freq: Every day | ORAL | 1 refills | Status: DC

## 2023-01-08 MED ORDER — DOXAZOSIN MESYLATE 4 MG PO TABS: ORAL_TABLET | ORAL | 1 refills | Status: DC

## 2023-01-08 NOTE — Progress Notes (Signed)
Kristen [name] [id number] [birth year] 52 y.o.     Subjective:   Patient ID:  Kristen [name] is a 52 y.o. (DOB 06/17/1971) female.  Chief Complaint:  Chief Complaint  Patient presents with   Follow-up    Bipolar I disorder ([place])   Anxiety   Sleeping Problem    HPI Kristen [name] presents to the office today for follow-up of bipolar disorder and anxiety   07/16/20 appt with the following noted: Thinks topiramate makes her labile and impulsive in speech.  Not me at all.  Didn't start until taking that.  Taking it for migraine and tinnitus.  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  Balance issues. No tx options. Otherwise mood is stable. Staying up late.  Want to stay up all night. Mood lability and irritability and cycling into being too "brave". SX started when increased topiramate to current dose. OCD is a lot better. She thinks Wellbutrin helps depression.  Pleased she's not depressed. Plan: Continue Trileptal to 900 mg dayly.     fluvaxamine 300 mg nightly. Continue Wellbutrin XL 150 every morning Reduce topiramate to 50 mg AM and 100 PM bc she felt it contributed to manic sx  09/29/2020 appointment with the following noted: Better with reduced topiramate with resolution of HA and manic subsided quite a bit. Not depressed.  Mood evened out. No SE meds. Needs refill fo Xanax and takes it nightly.  Mistake and received 0.25 instead of 0.5 mg tablets.  Not needed much daytime for anxiety. Patient reports stable mood and denies depressed or irritable moods.  Patient denies any recent difficulty with anxiety.  Patient denies difficulty with sleep initiation or maintenance. With meds 6-7 hours of sleep.  Denies appetite disturbance.  Patient reports that energy and motivation have been good.  Patient denies any difficulty with concentration.  Patient denies any suicidal ideation. Plan: No med changes  02/28/2021 appointment with the following noted: OK overall.   Some other issues from fall and consussion she had.  Including hearing affected.  Hearing aid.  Memory affected also by Covid per neuro.  Missed work for [name].  Neuro took her out of work for a couple of months. Mood has been great and stable. Plan: No med changes  08/31/2021 appointment with the following noted: Hearing loss from concussion about 30% and has tinnitus.   Pretty good from mental health perspective.  No mood swings.  Not depressed.  Minimal OCD and anxiety.  No SE with meds. Sleep is weird, sort of sleep walk.  May or may not recognize it.  Talks in sleep.  May yell at [name].  Touches things beside her bed.  Happens 2-3 times per week. Has neuro but not seen since March. History of low B12 and was on shots. 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  08/31/21 TC to pt: Her vitamin D level is much too low and has been historically too low.  That can contribute to fatigue and depression and cognitive problems.  I sent in a prescription for once a week vitamin D and high dose. Her B12 levels have historically been low as well and are currently borderline.  I sent in a prescription for vitamin B12 as well and that may help her energy, concentration, and mood. Please call and give her this information.  12/08/2021 appointment noted: Started B12 and vitamin D.   Overall doing ok.  A little bit of benefit noted with vitamins.  Severe osteoporsis in family.  No SE with meds. Patient reports stable mood and denies depressed or irritable moods.  Patient mild recent difficulty with anxiety.  Patient denies difficulty with sleep initiation or maintenance. Denies appetite disturbance.  Patient reports that energy and motivation have been good.  Patient denies any difficulty with concentration.  Patient denies any suicidal ideation. Bad NM once every 2-3 weeks and less than it was. 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 Plan: No med changes  03/14/2022 appointment with the following noted: Still taking  Wellbutrin XL 150 mg every morning, clonazepam 0.5 mg nightly as needed insomnia, fluvoxamine 300, lamotrigine 200, oxcarbazepine 300 every morning and 600 nightly, propranolol 20 mg 3 times daily as needed restlessness, topiramate 50 every morning and 100 nightly and vitamin B12 Doing ok overall.  Iron really low. Causing RLS NM and talking in her sleep bothers H. Awaken her twice weekly.  Occ sleep walking. Clonazepam can give a hangover.  12/28/22 appt noted: Still battles low iron and had 6-8 iron infusions.  And didn't make a big difference at the time.   And expensive and drove down BP. Hard time sleeping and doesn't know why and has NM  when sleeps.  Has gone 2-3 days wituot sleep.   OCD is out of control with death obsessions triggered by mother's carotid artery stent placement and out of anesthesia was agitated.  Scared her to death bc fear she would die.  She is somewhat recovered.    Family won't tell her things bc I get so upset.    Current meds. Still taking Wellbutrin XL 150 mg every morning, clonazepam 0.5 mg nightly as needed insomnia, fluvoxamine 300, lamotrigine 200, oxcarbazepine 300 every morning and 600 nightly, propranolol 20 mg 3 times daily as needed restlessness, topiramate 50 every morning and 100 nightly and vitamin B12.  No doxazosin. She had sinus surgery better. Father in law with dementia and mother in law talking about death.   Even with clonazepam 1.0 mg HS can't sleep.  Don't want to go anywhere.  Sometimes feels hyped up briefly.  Just wants to sit.  Hard to concentrate.  Hard to read.   Checks doors , stove, locks, powerstrips, daughter's room.  Worse.   Past Psychiatric Medication Trials: Vraylar 3 restless, Latuda 80 good response, risperidone nonresponse, Seroquel weight gain, Abilify 10,  oxcarbazepine 900 SE,    lamotrigine 300,  lithium,  topiramate  100 mg BID hypomania.  pramipexole,  duloxetine, Lexapro, Wellbutrin, fluoxetine, venlafaxine, sertraline,   fluvoxamine 300 clonazepam, alprazolam,  buspar NR    Review of Systems:  Review of Systems  Constitutional:  Positive for fatigue.  HENT:  Positive for hearing loss and tinnitus.   Neurological:  Negative for tremors, weakness and headaches.       RLS returned  Psychiatric/Behavioral:  Positive for decreased concentration. Negative for confusion and dysphoric mood. The patient is not nervous/anxious.     Medications: I have reviewed the patient's current medications.  Current Outpatient Medications  Medication Sig Dispense Refill   buPROPion (WELLBUTRIN XL) 150 MG 24 hr tablet TAKE ONE TABLET BY MOUTH EVERY DAY 90 tablet 1   clonazePAM (KLONOPIN) 0.5 MG tablet TAKE ONE TO TWO TABLETS BY MOUTH AT BEDTIME AS NEEDED FOR SLEEP DISORDER 60 tablet 1   fluticasone (FLONASE) 50 MCG/ACT nasal spray Place 2 sprays into both nostrils daily. 16 g 6   fluvoxaMINE (LUVOX) 100 MG tablet TAKE THREE TABLETS BY MOUTH AT BEDTIME 270 tablet 0   lamoTRIgine (LAMICTAL) 200 MG  tablet TAKE ONE TABLET BY MOUTH EVERY DAY 90 tablet 0   levonorgestrel (MIRENA) 20 MCG/24HR IUD by Intrauterine route.     Oxcarbazepine (TRILEPTAL) 300 MG tablet TAKE ONE TABLET BY MOUTH EVERY MORNING AND TAKE TWO TABLETS BY MOUTH AT BEDTIME 270 tablet 0   topiramate (TOPAMAX) 50 MG tablet TAKE ONE TABLET (50 MG) BY MOUTH EVERY MORNING AND TAKE TWO TABLETS (100 MG) BYMOUTH EVERY EVENING 270 tablet 1   XARELTO 10 MG TABS tablet TAKE ONE TABLET BY MOUTH EVERY DAY 30 tablet 12   doxazosin (CARDURA) 4 MG tablet Take 1/2 tab po QHS x 4 days, then may increase to 1 tab po QHS 30 tablet 1   lurasidone (LATUDA) 80 MG TABS tablet Take 1 tablet (80 mg total) by mouth daily with supper. 30 tablet 1   No current facility-administered medications for this visit.    Medication Side Effects: None  Allergies:  Allergies  Allergen Reactions   Codeine Nausea Only    Can tolerate medication when taken with food    Past Medical History:   Diagnosis Date   Anemia    Anxiety    Bipolar disorder (HCC)    Cholelithiasis    DVT (deep venous thrombosis) (HCC)    GERD (gastroesophageal reflux disease)    Kidney stones    PONV (postoperative nausea and vomiting)    Pulmonary embolism (HCC)    RLS (restless legs syndrome)     Family History  Problem Relation Age of Onset   Diabetes Father    Stroke Father    Heart attack Father    Kidney failure Father    Hypertension Father    Uterine cancer Maternal Grandmother    Ulcers Maternal Grandmother    Diabetes Maternal Grandfather    Stroke Maternal Grandfather    Breast cancer Paternal Grandmother    Colon cancer Paternal Grandfather    Hypertension Paternal Grandfather     Social History   Socioeconomic History   Marital status: Married    Spouse name: Not on file   Number of children: 3   Years of education: Not on file   Highest education level: Not on file  Occupational History   Occupation: Architectural technologist    Employer: [employer]  Tobacco Use   Smoking status: Never   Smokeless tobacco: Never  Vaping Use   Vaping Use: Never used  Substance and Sexual Activity   Alcohol use: No    Alcohol/week: 0.0 standard drinks of alcohol   Drug use: No   Sexual activity: Yes    Partners: Male    Birth control/protection: I.U.D.    Comment: mirena  Other Topics Concern   Not on file  Social History Narrative   Not on file   Social Determinants of Health   Financial Resource Strain: Not on file  Food Insecurity: Not on file  Transportation Needs: Not on file  Physical Activity: Not on file  Stress: Not on file  Social Connections: Not on file  Intimate Partner Violence: Not on file    Past Medical History, Surgical history, Social history, and Family history were reviewed and updated as appropriate.   Please see review of systems for further details on the patient's review from today.   Objective:   Physical Exam:  There were no  vitals taken for this visit.  Physical Exam Constitutional:      General: She is not in acute distress. Musculoskeletal:  General: No deformity.  Neurological:     Mental Status: She is alert and oriented to person, place, and time.     Cranial Nerves: No dysarthria.     Coordination: Coordination normal.  Psychiatric:        Attention and Perception: Attention and perception normal. She does not perceive auditory or visual hallucinations.        Mood and Affect: Mood normal. Mood is not anxious or depressed. Affect is not labile, blunt, angry or inappropriate.        Speech: Speech normal.        Behavior: Behavior normal. Behavior is cooperative.        Thought Content: Thought content normal. Thought content is not paranoid or delusional. Thought content does not include homicidal or suicidal ideation. Thought content does not include suicidal plan.        Cognition and Memory: Cognition is not impaired. She exhibits impaired recent memory.        Judgment: Judgment normal.     Comments: Insight intact Minimal anxiety      Lab Review:     Component Value Date/Time   NA 139 07/01/2018 1103   NA 140 08/15/2012 1624   K 3.8 07/01/2018 1103   K 3.6 08/15/2012 1624   CL 107 (H) 07/01/2018 1103   CL 106 08/15/2012 1624   CO2 21 07/01/2018 1103   CO2 26 08/15/2012 1624   GLUCOSE 88 07/01/2018 1103   GLUCOSE 153 (H) 03/05/2017 2142   GLUCOSE 83 08/15/2012 1624   BUN 8 07/01/2018 1103   BUN 10 08/15/2012 1624   CREATININE 0.56 (L) 07/01/2018 1103   CREATININE 0.55 (L) 08/15/2012 1624   CALCIUM 8.3 (L) 07/01/2018 1103   CALCIUM 9.0 08/15/2012 1624   PROT 5.6 (L) 07/01/2018 1103   PROT 6.5 08/15/2012 1624   ALBUMIN 3.4 (L) 07/01/2018 1103   ALBUMIN 3.4 08/15/2012 1624   AST 15 07/01/2018 1103   AST 22 08/15/2012 1624   ALT 12 07/01/2018 1103   ALT 23 08/15/2012 1624   ALKPHOS 69 07/01/2018 1103   ALKPHOS 98 08/15/2012 1624   BILITOT 0.5 07/01/2018 1103   BILITOT  0.8 08/15/2012 1624   GFRNONAA 112 07/01/2018 1103   GFRNONAA >60 08/15/2012 1624   GFRAA 129 07/01/2018 1103   GFRAA >60 08/15/2012 1624       Component Value Date/Time   WBC 5.8 06/06/2022 1515   RBC 4.07 06/06/2022 1515   HGB 9.7 (L) 06/06/2022 1515   HGB 9.5 (L) 02/09/2022 1617   HCT 32.6 (L) 06/06/2022 1515   HCT 30.8 (L) 02/09/2022 1617   PLT 422 (H) 06/06/2022 1515   PLT 429 02/09/2022 1617   MCV 80.1 06/06/2022 1515   MCV 77 (L) 02/09/2022 1617   MCV 91 08/15/2012 1624   MCH 23.8 (L) 06/06/2022 1515   MCHC 29.8 (L) 06/06/2022 1515   RDW 18.6 (H) 06/06/2022 1515   RDW 15.1 02/09/2022 1617   RDW 15.7 (H) 08/15/2012 1624   LYMPHSABS 1.2 06/06/2022 1515   LYMPHSABS 1.5 10/08/2015 1107   LYMPHSABS 1.9 08/15/2012 1624   MONOABS 0.6 06/06/2022 1515   MONOABS 0.8 08/15/2012 1624   EOSABS 0.6 (H) 06/06/2022 1515   EOSABS 0.1 10/08/2015 1107   EOSABS 0.2 08/15/2012 1624   BASOSABS 0.0 06/06/2022 1515   BASOSABS 0.0 10/08/2015 1107   BASOSABS 0.0 08/15/2012 1624    No results found for: "POCLITH", "LITHIUM"   No results found for: "PHENYTOIN", "  PHENOBARB", "VALPROATE", "CBMZ"   11/01/2021 vitamin D 16, B12 295  02/09/22 Ferritin low 8  .res Assessment: Plan:    Kristen [name] was seen today for follow-up, anxiety and sleeping problem.  Diagnoses and all orders for this visit:  Bipolar I disorder (HCC) -     lurasidone (LATUDA) 80 MG TABS tablet; Take 1 tablet (80 mg total) by mouth daily with supper.  Mixed obsessional thoughts and acts -     lurasidone (LATUDA) 80 MG TABS tablet; Take 1 tablet (80 mg total) by mouth daily with supper.  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 We discussed Good response and stability with psych meds when on Latuda.  However had to stop [name] DT cost.  This is very unfortunate.  Historically she has relapse with symptoms whenever she has stopped [name].  She is worse again but did ok without it for awhile.   Since stopping the [name] she had no marked depression or mania but had unexpected recurrence of obsessive-compulsive symptoms.  She had these types of symptoms as a teenager but has not had severe symptoms as an adult and recent years.  The increase in Trileptal to 900 mg daily did not help the OCD.   History of gastric bypass and secondary history low B12 and iron She is taking oral B12  And added vitamin D   Continue Trileptal to 900 mg dayly.    Informed her that we may have to go much higher and to particularly let us know if this insomnia persist.  Disc the off-label use of N-Acetylcysteine at 600 mg daily to help with mild cognitive problems.  It can be combined with a B-complex vitamin as the B-12 and folate have been shown to sometimes enhance the effect.   Discussed side effects of each med in detail.    fluvoxamine 300 mg nightly.  OCD not well controlled right now. Disc DDI with Xanax and use lower dosage.    Continue topiramate 50 mg in the morning and 100 mg in the evening.  The reduction resolved hypomania.  This is controlling headache  Continue Wellbutrin XL 150 mg every morning because it helped with depression and she is tolerating it well.  Should be able to get Latuda now that generic longer.  Never tried doxazosin for NM and sleep talking  2-4 mg HS If this doesnot work then will increase clonazepam Disc dx RBSD.  TX of  choice is clonazepam 0.5-1.0 mg HS   We discussed the short-term risks associated with benzodiazepines including sedation and increased fall risk among others.  Discussed long-term side effect risk including dependence, potential withdrawal symptoms, and the potential eventual dose-related risk of dementia. Use LED especially in view of the effect of Luvox on the Xanax.  Follow-up 6-8 weeks.  Meredith Staggers MD, DFAPA  Please see After Visit Summary for patient specific instructions.  No future  appointments.    No orders of the defined types were placed in this encounter.    -------------------------------

## 2023-01-12 ENCOUNTER — Other Ambulatory Visit: Payer: Self-pay | Admitting: Psychiatry

## 2023-01-12 DIAGNOSIS — F319 Bipolar disorder, unspecified: Secondary | ICD-10-CM

## 2023-02-05 ENCOUNTER — Other Ambulatory Visit: Payer: Self-pay | Admitting: Psychiatry

## 2023-02-05 DIAGNOSIS — G4752 REM sleep behavior disorder: Secondary | ICD-10-CM

## 2023-02-05 DIAGNOSIS — F422 Mixed obsessional thoughts and acts: Secondary | ICD-10-CM

## 2023-02-05 DIAGNOSIS — F319 Bipolar disorder, unspecified: Secondary | ICD-10-CM

## 2023-02-13 ENCOUNTER — Other Ambulatory Visit: Payer: Self-pay

## 2023-02-13 DIAGNOSIS — F319 Bipolar disorder, unspecified: Secondary | ICD-10-CM

## 2023-02-13 MED ORDER — OXCARBAZEPINE 300 MG PO TABS: ORAL_TABLET | ORAL | 0 refills | Status: DC

## 2023-02-28 ENCOUNTER — Encounter: Payer: Self-pay | Admitting: Psychiatry

## 2023-02-28 ENCOUNTER — Ambulatory Visit (INDEPENDENT_AMBULATORY_CARE_PROVIDER_SITE_OTHER): Payer: 59 | Admitting: Psychiatry

## 2023-02-28 DIAGNOSIS — F319 Bipolar disorder, unspecified: Secondary | ICD-10-CM

## 2023-02-28 DIAGNOSIS — F422 Mixed obsessional thoughts and acts: Secondary | ICD-10-CM | POA: Diagnosis not present

## 2023-02-28 DIAGNOSIS — Z9884 Bariatric surgery status: Secondary | ICD-10-CM

## 2023-02-28 DIAGNOSIS — F5105 Insomnia due to other mental disorder: Secondary | ICD-10-CM

## 2023-02-28 DIAGNOSIS — F411 Generalized anxiety disorder: Secondary | ICD-10-CM

## 2023-02-28 DIAGNOSIS — G2581 Restless legs syndrome: Secondary | ICD-10-CM

## 2023-02-28 DIAGNOSIS — R7989 Other specified abnormal findings of blood chemistry: Secondary | ICD-10-CM

## 2023-02-28 DIAGNOSIS — E611 Iron deficiency: Secondary | ICD-10-CM

## 2023-02-28 DIAGNOSIS — E538 Deficiency of other specified B group vitamins: Secondary | ICD-10-CM

## 2023-02-28 DIAGNOSIS — F515 Nightmare disorder: Secondary | ICD-10-CM

## 2023-02-28 DIAGNOSIS — F4001 Agoraphobia with panic disorder: Secondary | ICD-10-CM

## 2023-02-28 DIAGNOSIS — G43009 Migraine without aura, not intractable, without status migrainosus: Secondary | ICD-10-CM

## 2023-02-28 DIAGNOSIS — G4752 REM sleep behavior disorder: Secondary | ICD-10-CM

## 2023-02-28 MED ORDER — BUPROPION HCL ER (XL) 150 MG PO TB24: 150.0000 mg | ORAL_TABLET | Freq: Every day | ORAL | 1 refills | Status: DC

## 2023-02-28 MED ORDER — DOXAZOSIN MESYLATE 4 MG PO TABS: 4.0000 mg | ORAL_TABLET | Freq: Every evening | ORAL | 0 refills | Status: DC

## 2023-02-28 MED ORDER — FLUVOXAMINE MALEATE 100 MG PO TABS: 300.0000 mg | ORAL_TABLET | Freq: Every day | ORAL | 0 refills | Status: DC

## 2023-02-28 MED ORDER — CLONAZEPAM 0.5 MG PO TABS: ORAL_TABLET | ORAL | 4 refills | Status: DC

## 2023-02-28 MED ORDER — LAMOTRIGINE 200 MG PO TABS: 200.0000 mg | ORAL_TABLET | Freq: Every day | ORAL | 0 refills | Status: DC

## 2023-02-28 MED ORDER — OXCARBAZEPINE 300 MG PO TABS: ORAL_TABLET | ORAL | 1 refills | Status: DC

## 2023-02-28 MED ORDER — TOPIRAMATE 50 MG PO TABS: ORAL_TABLET | ORAL | 1 refills | Status: DC

## 2023-02-28 MED ORDER — LURASIDONE HCL 80 MG PO TABS: 80.0000 mg | ORAL_TABLET | Freq: Every day | ORAL | 1 refills | Status: DC

## 2023-02-28 NOTE — Progress Notes (Signed)
Kristen [name] [id number] 09-May-1971 52 y.o.     Subjective:   Patient ID:  Kristen [name] is a 52 y.o. (DOB Feb 16, 1971) female.  Chief Complaint:  Chief Complaint  Patient presents with   Follow-up   Depression   Anxiety   Sleeping Problem    HPI Kristen [name] presents to the office today for follow-up of bipolar disorder and anxiety   07/16/20 appt with the following noted: Thinks topiramate makes her labile and impulsive in speech.  Not me at all.  Didn't start until taking that.  Taking it for migraine and tinnitus.  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  Balance issues. No tx options. Otherwise mood is stable. Staying up late.  Want to stay up all night. Mood lability and irritability and cycling into being too "brave". SX started when increased topiramate to current dose. OCD is a lot better. She thinks Wellbutrin helps depression.  Pleased she's not depressed. Plan: Continue Trileptal to 900 mg dayly.     fluvaxamine 300 mg nightly. Continue Wellbutrin XL 150 every morning Reduce topiramate to 50 mg AM and 100 PM bc she felt it contributed to manic sx  09/29/2020 appointment with the following noted: Better with reduced topiramate with resolution of HA and manic subsided quite a bit. Not depressed.  Mood evened out. No SE meds. Needs refill fo Xanax and takes it nightly.  Mistake and received 0.25 instead of 0.5 mg tablets.  Not needed much daytime for anxiety. Patient reports stable mood and denies depressed or irritable moods.  Patient denies any recent difficulty with anxiety.  Patient denies difficulty with sleep initiation or maintenance. With meds 6-7 hours of sleep.  Denies appetite disturbance.  Patient reports that energy and motivation have been good.  Patient denies any difficulty with concentration.  Patient denies any suicidal ideation. Plan: No med changes  02/28/2021 appointment with the following noted: OK overall.  Some other  issues from fall and consussion she had.  Including hearing affected.  Hearing aid.  Memory affected also by Covid per neuro.  Missed work for [name].  Neuro took her out of work for a couple of months. Mood has been great and stable. Plan: No med changes  08/31/2021 appointment with the following noted: Hearing loss from concussion about 30% and has tinnitus.   Pretty good from mental health perspective.  No mood swings.  Not depressed.  Minimal OCD and anxiety.  No SE with meds. Sleep is weird, sort of sleep walk.  May or may not recognize it.  Talks in sleep.  May yell at [place].  Touches things beside her bed.  Happens 2-3 times per week. Has neuro but not seen since March. History of low B12 and was on shots. 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  08/31/21 TC to pt: Her vitamin D level is much too low and has been historically too low.  That can contribute to fatigue and depression and cognitive problems.  I sent in a prescription for once a week vitamin D and high dose. Her B12 levels have historically been low as well and are currently borderline.  I sent in a prescription for vitamin B12 as well and that may help her energy, concentration, and mood. Please call and give her this information.  12/08/2021 appointment noted: Started B12 and vitamin D.   Overall doing ok.  A little bit of benefit noted with vitamins.  Severe osteoporsis in family. No SE with meds.  Patient reports stable mood and denies depressed or irritable moods.  Patient mild recent difficulty with anxiety.  Patient denies difficulty with sleep initiation or maintenance. Denies appetite disturbance.  Patient reports that energy and motivation have been good.  Patient denies any difficulty with concentration.  Patient denies any suicidal ideation. Bad NM once every 2-3 weeks and less than it was. 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 Plan: No med changes  03/14/2022 appointment with the following noted: Still taking Wellbutrin XL 150  mg every morning, clonazepam 0.5 mg nightly as needed insomnia, fluvoxamine 300, lamotrigine 200, oxcarbazepine 300 every morning and 600 nightly, propranolol 20 mg 3 times daily as needed restlessness, topiramate 50 every morning and 100 nightly and vitamin B12 Doing ok overall.  Iron really low. Causing RLS NM and talking in her sleep bothers H. Awaken her twice weekly.  Occ sleep walking. Clonazepam can give a hangover.  12/28/22 appt noted: Still battles low iron and had 6-8 iron infusions.  And didn't make a big difference at the time.   And expensive and drove down BP. Hard time sleeping and doesn't know why and has NM  when sleeps.  Has gone 2-3 days wituot sleep.   OCD is out of control with death obsessions triggered by mother's carotid artery stent placement and out of anesthesia was agitated.  Scared her to death bc fear she would die.  She is somewhat recovered.    Family won't tell her things bc I get so upset.    Current meds. Still taking Wellbutrin XL 150 mg every morning, clonazepam 0.5 mg nightly as needed insomnia, fluvoxamine 300, lamotrigine 200, oxcarbazepine 300 every morning and 600 nightly, propranolol 20 mg 3 times daily as needed restlessness, topiramate 50 every morning and 100 nightly and vitamin B12.  No doxazosin. She had sinus surgery better. Father in law with dementia and mother in law talking about death.   Even with clonazepam 1.0 mg HS can't sleep.  Don't want to go anywhere.  Sometimes feels hyped up briefly.  Just wants to sit.  Hard to concentrate.  Hard to read.   Checks doors , stove, locks, powerstrips, daughter's room.  Worse. Plan: Never tried doxazosin for NM and sleep talking  2-4 mg HS If this doesnot work then will increase clonazepam Disc dx RBSD.  TX of choice is clonazepam 0.5-1.0 mg HS Restart Latuda 80 mg daily.  02/28/23 appt noted: Still taking Wellbutrin XL 150 mg every morning, clonazepam 0.5 mg nightly as needed insomnia, fluvoxamine 300,  lamotrigine 200, oxcarbazepine 300 every morning and 600 nightly, propranolol 20 mg 3 times daily as needed restlessness, topiramate 50 every morning and 100 nightly and vitamin B12.  Doxazosin 4 mg HS helped. Restarted Latuda 80 mg daily. Sleep is better.   Only issue is iron, D, C are low and has new PCP and will be taking supplements.  Doesn't like iron infusions and they are expensive and only work for a couple of mos.   Muscle and bone pain from deficiencies.   Mood has been good and much better with Latuda.  Sleep got better with it too.  No NM now.   Now clonazepam only prn sleep and not daily.  Past Psychiatric Medication Trials: Vraylar 3 restless, risperidone nonresponse, Seroquel weight gain, Abilify 10,  Latuda 80 good response,  oxcarbazepine 900 SE,    lamotrigine 300,  lithium,  topiramate  100 mg BID hypomania.  pramipexole,  duloxetine, Lexapro, Wellbutrin, fluoxetine, venlafaxine, sertraline,  fluvoxamine  300 clonazepam, alprazolam,  buspar NR Doxazosin 4   Review of Systems:  Review of Systems  Constitutional:  Positive for fatigue.  HENT:  Positive for hearing loss and tinnitus.   Neurological:  Negative for tremors, weakness and headaches.       RLS returned  Psychiatric/Behavioral:  Negative for confusion, decreased concentration and dysphoric mood. The patient is not nervous/anxious.     Medications: I have reviewed the patient's current medications.  Current Outpatient Medications  Medication Sig Dispense Refill   buPROPion (WELLBUTRIN XL) 150 MG 24 hr tablet Take 1 tablet (150 mg total) by mouth daily. 90 tablet 1   clonazePAM (KLONOPIN) 0.5 MG tablet TAKE ONE TO TWO TABLETS BY MOUTH AT BEDTIME AS NEEDED FOR SLEEP DISORDER 60 tablet 4   doxazosin (CARDURA) 4 MG tablet Take 1 tablet (4 mg total) by mouth at bedtime. 90 tablet 0   fluticasone (FLONASE) 50 MCG/ACT nasal spray Place 2 sprays into both nostrils daily. 16 g 6   fluvoxaMINE (LUVOX) 100 MG tablet  Take 3 tablets (300 mg total) by mouth at bedtime. 270 tablet 0   lamoTRIgine (LAMICTAL) 200 MG tablet Take 1 tablet (200 mg total) by mouth daily. 90 tablet 0   levonorgestrel (MIRENA) 20 MCG/24HR IUD by Intrauterine route.     lurasidone (LATUDA) 80 MG TABS tablet Take 1 tablet (80 mg total) by mouth daily at 12 noon. 90 tablet 1   Oxcarbazepine (TRILEPTAL) 300 MG tablet TAKE ONE TABLET BY MOUTH EVERY MORNING AND TAKE TWO TABLETS BY MOUTH AT BEDTIME 270 tablet 1   topiramate (TOPAMAX) 50 MG tablet TAKE ONE TABLET (50 MG) BY MOUTH EVERY MORNING AND TAKE TWO TABLETS (100 MG) BYMOUTH EVERY EVENING 270 tablet 1   XARELTO 10 MG TABS tablet TAKE ONE TABLET BY MOUTH EVERY DAY 30 tablet 12   No current facility-administered medications for this visit.    Medication Side Effects: None  Allergies:  Allergies  Allergen Reactions   Codeine Nausea Only    Can tolerate medication when taken with food    Past Medical History:  Diagnosis Date   Anemia    Anxiety    Bipolar disorder (HCC)    Cholelithiasis    DVT (deep venous thrombosis) (HCC)    GERD (gastroesophageal reflux disease)    Kidney stones    PONV (postoperative nausea and vomiting)    Pulmonary embolism (HCC)    RLS (restless legs syndrome)     Family History  Problem Relation Age of Onset   Diabetes Father    Stroke Father    Heart attack Father    Kidney failure Father    Hypertension Father    Uterine cancer Maternal Grandmother    Ulcers Maternal Grandmother    Diabetes Maternal Grandfather    Stroke Maternal Grandfather    Breast cancer Paternal Grandmother    Colon cancer Paternal Grandfather    Hypertension Paternal Grandfather     Social History   Socioeconomic History   Marital status: Married    Spouse name: Not on file   Number of children: 3   Years of education: Not on file   Highest education level: Not on file  Occupational History   Occupation: Optometrist    Employer: [employer]  Tobacco Use   Smoking status: Never   Smokeless tobacco: Never  Vaping Use   Vaping Use: Never used  Substance and Sexual Activity   Alcohol use: No  Alcohol/week: 0.0 standard drinks of alcohol   Drug use: No   Sexual activity: Yes    Partners: Male    Birth control/protection: I.U.D.    Comment: mirena  Other Topics Concern   Not on file  Social History Narrative   Not on file   Social Determinants of Health   Financial Resource Strain: Not on file  Food Insecurity: Not on file  Transportation Needs: Not on file  Physical Activity: Not on file  Stress: Not on file  Social Connections: Not on file  Intimate Partner Violence: Not on file    Past Medical History, Surgical history, Social history, and Family history were reviewed and updated as appropriate.   Please see review of systems for further details on the patient's review from today.   Objective:   Physical Exam:  There were no vitals taken for this visit.  Physical Exam Constitutional:      General: She is not in acute distress. Musculoskeletal:        General: No deformity.  Neurological:     Mental Status: She is alert and oriented to person, place, and time.     Cranial Nerves: No dysarthria.     Coordination: Coordination normal.  Psychiatric:        Attention and Perception: Attention and perception normal. She does not perceive auditory or visual hallucinations.        Mood and Affect: Mood normal. Mood is not anxious or depressed. Affect is not labile, blunt, angry or inappropriate.        Speech: Speech normal.        Behavior: Behavior normal. Behavior is cooperative.        Thought Content: Thought content normal. Thought content is not paranoid or delusional. Thought content does not include homicidal or suicidal ideation. Thought content does not include suicidal plan.        Cognition and Memory: Cognition is not impaired. She exhibits impaired recent memory.         Judgment: Judgment normal.     Comments: Insight intact Minimal anxiety      Lab Review:     Component Value Date/Time   NA 139 07/01/2018 1103   NA 140 08/15/2012 1624   K 3.8 07/01/2018 1103   K 3.6 08/15/2012 1624   CL 107 (H) 07/01/2018 1103   CL 106 08/15/2012 1624   CO2 21 07/01/2018 1103   CO2 26 08/15/2012 1624   GLUCOSE 88 07/01/2018 1103   GLUCOSE 153 (H) 03/05/2017 2142   GLUCOSE 83 08/15/2012 1624   BUN 8 07/01/2018 1103   BUN 10 08/15/2012 1624   CREATININE 0.56 (L) 07/01/2018 1103   CREATININE 0.55 (L) 08/15/2012 1624   CALCIUM 8.3 (L) 07/01/2018 1103   CALCIUM 9.0 08/15/2012 1624   PROT 5.6 (L) 07/01/2018 1103   PROT 6.5 08/15/2012 1624   ALBUMIN 3.4 (L) 07/01/2018 1103   ALBUMIN 3.4 08/15/2012 1624   AST 15 07/01/2018 1103   AST 22 08/15/2012 1624   ALT 12 07/01/2018 1103   ALT 23 08/15/2012 1624   ALKPHOS 69 07/01/2018 1103   ALKPHOS 98 08/15/2012 1624   BILITOT 0.5 07/01/2018 1103   BILITOT 0.8 08/15/2012 1624   GFRNONAA 112 07/01/2018 1103   GFRNONAA >60 08/15/2012 1624   GFRAA 129 07/01/2018 1103   GFRAA >60 08/15/2012 1624       Component Value Date/Time   WBC 5.8 06/06/2022 1515   RBC 4.07 06/06/2022 1515  HGB 9.7 (L) 06/06/2022 1515   HGB 9.5 (L) 02/09/2022 1617   HCT 32.6 (L) 06/06/2022 1515   HCT 30.8 (L) 02/09/2022 1617   PLT 422 (H) 06/06/2022 1515   PLT 429 02/09/2022 1617   MCV 80.1 06/06/2022 1515   MCV 77 (L) 02/09/2022 1617   MCV 91 08/15/2012 1624   MCH 23.8 (L) 06/06/2022 1515   MCHC 29.8 (L) 06/06/2022 1515   RDW 18.6 (H) 06/06/2022 1515   RDW 15.1 02/09/2022 1617   RDW 15.7 (H) 08/15/2012 1624   LYMPHSABS 1.2 06/06/2022 1515   LYMPHSABS 1.5 10/08/2015 1107   LYMPHSABS 1.9 08/15/2012 1624   MONOABS 0.6 06/06/2022 1515   MONOABS 0.8 08/15/2012 1624   EOSABS 0.6 (H) 06/06/2022 1515   EOSABS 0.1 10/08/2015 1107   EOSABS 0.2 08/15/2012 1624   BASOSABS 0.0 06/06/2022 1515   BASOSABS 0.0 10/08/2015 1107   BASOSABS  0.0 08/15/2012 1624    No results found for: "POCLITH", "LITHIUM"   No results found for: "PHENYTOIN", "PHENOBARB", "VALPROATE", "CBMZ"   11/01/2021 vitamin D 16, B12 295  02/09/22 Ferritin low 8  .res Assessment: Plan:    Kristen [name] was seen today for follow-up, depression, anxiety and sleeping problem.  Diagnoses and all orders for this visit:  Bipolar I disorder ([place]) -     lurasidone (LATUDA) 80 MG TABS tablet; Take 1 tablet (80 mg total) by mouth daily at 12 noon. -     buPROPion (WELLBUTRIN XL) 150 MG 24 hr tablet; Take 1 tablet (150 mg total) by mouth daily. -     lamoTRIgine (LAMICTAL) 200 MG tablet; Take 1 tablet (200 mg total) by mouth daily. -     Oxcarbazepine (TRILEPTAL) 300 MG tablet; TAKE ONE TABLET BY MOUTH EVERY MORNING AND TAKE TWO TABLETS BY MOUTH AT BEDTIME  Mixed obsessional thoughts and acts -     lurasidone (LATUDA) 80 MG TABS tablet; Take 1 tablet (80 mg total) by mouth daily at 12 noon. -     fluvoxaMINE (LUVOX) 100 MG tablet; Take 3 tablets (300 mg total) by mouth at bedtime.  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  Panic disorder with agoraphobia  Generalized anxiety disorder  Insomnia due to mental condition  REM sleep behavior disorder -     clonazePAM (KLONOPIN) 0.5 MG tablet; TAKE ONE TO TWO TABLETS BY MOUTH AT BEDTIME AS NEEDED FOR SLEEP DISORDER  Low vitamin D level  Low serum vitamin B12  Migraine without aura and without status migrainosus, not intractable -     topiramate (TOPAMAX) 50 MG tablet; TAKE ONE TABLET (50 MG) BY MOUTH EVERY MORNING AND TAKE TWO TABLETS (100 MG) BYMOUTH EVERY EVENING  Nightmares REM-sleep type -     doxazosin (CARDURA) 4 MG tablet; Take 1 tablet (4 mg total) by mouth at bedtime.  Status following gastric bypass for weight loss      .Greater than 50% of 30-minute face to face time with patient was spent on counseling and coordination of care. We discussed Good response and  stability with psych meds when on Latuda.  However had to stop [place] DT cost.  This was very unfortunate.  Historically she has relapse with symptoms whenever she has stopped [place].  She did ok without it for awhile then had relapse of sx including: Since stopping the [place] she had no marked depression or mania but had unexpected recurrence of obsessive-compulsive symptoms.  She had these types of symptoms as a teenager but has  not had severe symptoms as an adult and recent years.  The increase in Trileptal to 900 mg daily did not help the OCD.   Was able to restart Latuda 80 mg and sx resolved again.  History of gastric bypass and secondary history low B12 and iron She is taking oral B12  And added vitamin D   Continue Trileptal to 900 mg dayly.    Informed her that we may have to go much higher and to particularly let us know if this insomnia persist.  Disc the off-label use of N-Acetylcysteine at 600 mg daily to help with mild cognitive problems.  It can be combined with a B-complex vitamin as the B-12 and folate have been shown to sometimes enhance the effect.   Discussed side effects of each med in detail.    fluvoxamine 300 mg nightly.  OCD betterl controlled right now. Disc DDI with Xanax and use lower dosage.    Continue topiramate 50 mg in the morning and 100 mg in the evening.  The reduction resolved hypomania.  This is controlling headache  Continue Wellbutrin XL 150 mg every morning because it helped with depression and she is tolerating it well.  tried doxazosin for NM and sleep talking  2-4 mg HS and it worked. Disc dx RBSD.    We discussed the short-term risks associated with benzodiazepines including sedation and increased fall risk among others.  Discussed long-term side effect risk including dependence, potential withdrawal symptoms, and the potential eventual dose-related risk of dementia.  But recent studies from 2020 dispute this association between benzodiazepines and  dementia risk. Newer studies in 2020 do not support an association with dementia. Use LED especially in view of the effect of Luvox on the Xanax.  Follow-up 3-4 mos  Lynder Parents MD, DFAPA  Please see After Visit Summary for patient specific instructions.  Future Appointments  Date Time Provider [place]  05/31/2023 10:00 AM Cottle, Billey Co., MD [facility] None       No orders of the defined types were placed in this encounter.    -------------------------------

## 2023-03-30 ENCOUNTER — Telehealth: Payer: Self-pay | Admitting: Psychiatry

## 2023-03-30 NOTE — Telephone Encounter (Signed)
[facility] sent PA request for Latuda 80mg  . See CMM

## 2023-04-01 DIAGNOSIS — S29012A Strain of muscle and tendon of back wall of thorax, initial encounter: Secondary | ICD-10-CM | POA: Diagnosis not present

## 2023-04-02 NOTE — Telephone Encounter (Signed)
Prior Approval received for Lurasidone 80 mg tablets with [employer] through 04/01/2024

## 2023-04-03 ENCOUNTER — Telehealth: Payer: Self-pay | Admitting: Psychiatry

## 2023-04-03 NOTE — Telephone Encounter (Signed)
[facility] [employer] approved LURASIDONE   04/02/23-04/01/2026

## 2023-04-07 DIAGNOSIS — S8392XA Sprain of unspecified site of left knee, initial encounter: Secondary | ICD-10-CM | POA: Diagnosis not present

## 2023-04-12 DIAGNOSIS — M25562 Pain in left knee: Secondary | ICD-10-CM | POA: Diagnosis not present

## 2023-04-13 DIAGNOSIS — S8001XA Contusion of right knee, initial encounter: Secondary | ICD-10-CM | POA: Diagnosis not present

## 2023-04-20 DIAGNOSIS — M84361A Stress fracture, right tibia, initial encounter for fracture: Secondary | ICD-10-CM | POA: Diagnosis not present

## 2023-05-24 ENCOUNTER — Encounter: Payer: Self-pay | Admitting: Oncology

## 2023-05-31 ENCOUNTER — Ambulatory Visit (INDEPENDENT_AMBULATORY_CARE_PROVIDER_SITE_OTHER): Payer: 59 | Admitting: Psychiatry

## 2023-06-12 ENCOUNTER — Other Ambulatory Visit: Payer: Self-pay | Admitting: Family Medicine

## 2023-06-12 DIAGNOSIS — J301 Allergic rhinitis due to pollen: Secondary | ICD-10-CM

## 2023-06-13 NOTE — Telephone Encounter (Signed)
Requested Prescriptions  Pending Prescriptions Disp Refills   fluticasone (FLONASE) 50 MCG/ACT nasal spray [Pharmacy Med Name: FLUTICASONE PROPIONATE 50 MCG/ACT N] 16 g 6    Sig: PLACE 2 SPRAYS INTO BOTH NOSTRILS DAILY.     Ear, Nose, and Throat: Nasal Preparations - Corticosteroids Failed - 06/12/2023  9:52 AM      Failed - Valid encounter within last 12 months    Recent Outpatient Visits           1 year ago Subacute cough   [facility] Malva Limes, MD   1 year ago Acute cough   [place] [facility] Malva Limes, MD   1 year ago Other pulmonary embolism without acute cor pulmonale, unspecified chronicity (HCC)   [place] [facility] Malva Limes, MD   3 years ago Episodic paroxysmal hemicrania, not intractable   [place] [facility] Malva Limes, MD   3 years ago Postconcussive syndrome   [facility] Joycelyn Man [place], [place]

## 2023-06-15 ENCOUNTER — Other Ambulatory Visit: Payer: Self-pay | Admitting: Family Medicine

## 2023-06-26 ENCOUNTER — Other Ambulatory Visit: Payer: Self-pay | Admitting: Obstetrics and Gynecology

## 2023-06-26 DIAGNOSIS — Z30432 Encounter for removal of intrauterine contraceptive device: Secondary | ICD-10-CM | POA: Diagnosis not present

## 2023-06-26 DIAGNOSIS — Z1231 Encounter for screening mammogram for malignant neoplasm of breast: Secondary | ICD-10-CM

## 2023-06-26 DIAGNOSIS — Z1339 Encounter for screening examination for other mental health and behavioral disorders: Secondary | ICD-10-CM | POA: Diagnosis not present

## 2023-06-26 DIAGNOSIS — Z1331 Encounter for screening for depression: Secondary | ICD-10-CM | POA: Diagnosis not present

## 2023-06-26 DIAGNOSIS — Z01419 Encounter for gynecological examination (general) (routine) without abnormal findings: Secondary | ICD-10-CM | POA: Diagnosis not present

## 2023-06-26 DIAGNOSIS — Z124 Encounter for screening for malignant neoplasm of cervix: Secondary | ICD-10-CM | POA: Diagnosis not present

## 2023-06-26 LAB — RESULTS CONSOLE HPV: CHL HPV: NEGATIVE

## 2023-06-26 LAB — HM PAP SMEAR: HM Pap smear: NORMAL

## 2023-07-10 ENCOUNTER — Other Ambulatory Visit: Payer: Self-pay | Admitting: Psychiatry

## 2023-07-10 DIAGNOSIS — F515 Nightmare disorder: Secondary | ICD-10-CM

## 2023-07-17 ENCOUNTER — Encounter: Payer: Self-pay | Admitting: Oncology

## 2023-07-23 ENCOUNTER — Other Ambulatory Visit: Payer: Self-pay | Admitting: Family Medicine

## 2023-07-24 ENCOUNTER — Encounter: Payer: Self-pay | Admitting: Oncology

## 2023-07-24 ENCOUNTER — Ambulatory Visit
Admission: RE | Admit: 2023-07-24 | Discharge: 2023-07-24 | Disposition: A | Discharge status: Home / Self Care | Payer: 59 | Source: Ambulatory Visit | Attending: Obstetrics and Gynecology | Admitting: Obstetrics and Gynecology

## 2023-07-24 DIAGNOSIS — Z1231 Encounter for screening mammogram for malignant neoplasm of breast: Secondary | ICD-10-CM | POA: Diagnosis not present

## 2023-07-24 NOTE — Telephone Encounter (Signed)
Follow-up appointment has been scheduled tomorrow. Will route to provider for refill approval.

## 2023-07-24 NOTE — Telephone Encounter (Signed)
Requested medication (s) are due for refill today: Yes  Requested medication (s) are on the active medication list: Yes  Last refill:  06/15/23 #30 0RF  Future visit scheduled: Yes  Notes to clinic:  Unable to refill per protocol due to failed labs, no updated results.      Requested Prescriptions  Pending Prescriptions Disp Refills   XARELTO 10 MG TABS tablet [Pharmacy Med Name: XARELTO 10 MG TAB] 30 tablet 0    Sig: TAKE 1 TABLET BY MOUTH DAILY     Hematology: Anticoagulants - rivaroxaban Failed - 07/23/2023  4:57 PM      Failed - ALT in normal range and within 360 days    ALT  Date Value Ref Range Status  07/01/2018 12 0 - 32 IU/L Final   SGPT (ALT)  Date Value Ref Range Status  08/15/2012 23 12 - 78 U/L Final         Failed - AST in normal range and within 360 days    AST  Date Value Ref Range Status  07/01/2018 15 0 - 40 IU/L Final   SGOT(AST)  Date Value Ref Range Status  08/15/2012 22 15 - 37 Unit/L Final         Failed - Cr in normal range and within 360 days    Creatinine  Date Value Ref Range Status  08/15/2012 0.55 (L) 0.60 - 1.30 mg/dL Final   Creatinine, Ser  Date Value Ref Range Status  07/01/2018 0.56 (L) 0.57 - 1.00 mg/dL Final         Failed - HCT in normal range and within 360 days    HCT  Date Value Ref Range Status  06/06/2022 32.6 (L) 36.0 - 46.0 % Final   Hematocrit  Date Value Ref Range Status  02/09/2022 30.8 (L) 34.0 - 46.6 % Final         Failed - HGB in normal range and within 360 days    Hemoglobin  Date Value Ref Range Status  06/06/2022 9.7 (L) 12.0 - 15.0 g/dL Final  91/47/8295 9.5 (L) 11.1 - 15.9 g/dL Final         Failed - PLT in normal range and within 360 days    Platelets  Date Value Ref Range Status  06/06/2022 422 (H) 150 - 400 K/uL Final  02/09/2022 429 150 - 450 x10E3/uL Final   Platelet Count, POC  Date Value Ref Range Status  08/21/2012 391 142 - 424 K/uL Final         Failed - eGFR is 15 or above and  within 360 days    EGFR (African American)  Date Value Ref Range Status  08/15/2012 >60  Final   GFR calc Af Amer  Date Value Ref Range Status  07/01/2018 129 >59 mL/min/1.73 Final   EGFR (Non-African Amer.)  Date Value Ref Range Status  08/15/2012 >60  Final    Comment:    eGFR values <1mL/min/1.73 m2 may be an indication of chronic kidney disease (CKD). Calculated eGFR is useful in patients with stable renal function. The eGFR calculation will not be reliable in acutely ill patients when serum creatinine is changing rapidly. It is not useful in  patients on dialysis. The eGFR calculation may not be applicable to patients at the low and high extremes of body sizes, pregnant women, and vegetarians.    GFR calc non Af Amer  Date Value Ref Range Status  07/01/2018 112 >59 mL/min/1.73 Final  Failed - Valid encounter within last 12 months    Recent Outpatient Visits           1 year ago Subacute cough   [place] [facility] Malva Limes, MD   1 year ago Acute cough   [place] [facility] Malva Limes, MD   1 year ago Other pulmonary embolism without acute cor pulmonale, unspecified chronicity (HCC)   [place] [facility] Malva Limes, MD   3 years ago Episodic paroxysmal hemicrania, not intractable   [facility] Malva Limes, MD   3 years ago Postconcussive syndrome   [facility] [place], [place]andra [place], [place]       Future Appointments             Tomorrow Sherrie Mustache, Demetrios Isaacs, MD [facility], [facility]            Passed - Patient is not pregnant

## 2023-07-25 ENCOUNTER — Encounter: Payer: Self-pay | Admitting: Family Medicine

## 2023-07-25 ENCOUNTER — Ambulatory Visit: Payer: 59 | Admitting: Family Medicine

## 2023-07-25 VITALS — BP 111/85 | HR 70 | Temp 98.8°F | Ht 69.0 in | Wt 161.0 lb

## 2023-07-25 DIAGNOSIS — K760 Fatty (change of) liver, not elsewhere classified: Secondary | ICD-10-CM

## 2023-07-25 DIAGNOSIS — D508 Other iron deficiency anemias: Secondary | ICD-10-CM

## 2023-07-25 DIAGNOSIS — I2699 Other pulmonary embolism without acute cor pulmonale: Secondary | ICD-10-CM | POA: Diagnosis not present

## 2023-07-25 DIAGNOSIS — E559 Vitamin D deficiency, unspecified: Secondary | ICD-10-CM | POA: Diagnosis not present

## 2023-07-25 DIAGNOSIS — Z1159 Encounter for screening for other viral diseases: Secondary | ICD-10-CM

## 2023-07-25 DIAGNOSIS — R739 Hyperglycemia, unspecified: Secondary | ICD-10-CM

## 2023-07-25 DIAGNOSIS — F411 Generalized anxiety disorder: Secondary | ICD-10-CM | POA: Diagnosis not present

## 2023-07-25 DIAGNOSIS — E7800 Pure hypercholesterolemia, unspecified: Secondary | ICD-10-CM

## 2023-07-25 DIAGNOSIS — E538 Deficiency of other specified B group vitamins: Secondary | ICD-10-CM

## 2023-07-25 NOTE — Patient Instructions (Signed)
.   Please review the attached list of medications and notify my office if there are any errors.   . Please bring all of your medications to every appointment so we can make sure that our medication list is the same as yours.   

## 2023-07-25 NOTE — Progress Notes (Signed)
Established patient visit   Patient: Kristen [name]   DOB: January 24, 1971   52 y.o. Female  MRN: [id number] Visit Date: 07/25/2023  Today's healthcare provider: Mila Merry, MD   Chief Complaint  Patient presents with   Medication Management    Patient presents for refills on her Xarelto.  She reports that she has been doing well and has no complaints.    Subjective    Discussed the use of AI scribe software for clinical note transcription with the patient, who gave verbal consent to proceed.  History of Present Illness   The patient, with a history of anemia and low iron levels, has been on lifelong anticoagulation for recurrent PES, currently on Xarelto, and has had no issues with the medication. She denies any unusual bleeding, bruising, chest pain, heart flutter, or shortness of breath. She also denies any swelling in her legs or any pain in her calves or thighs. The patient has been seeing Dr. Jennelle Human for mental health medications which she reports are working well. She has also been seeing [facility] for routine physicals. The patient's vitamin D levels were previously low for which took supplemental vitamin D for awhile, but has been off of now for an extended period and not taking any other suppplements, iron pills, or vitamins.  The patient had a colonoscopy in 2018 due to anemia and low iron levels, with no issues found. She has a family history of colon cancer in her paternal grandfather having had the disease. However, no other immediate family members have had colon cancer. The patient's father has passed away, but her mother and one brother are healthy.       Medications: Outpatient Medications Prior to Visit  Medication Sig   buPROPion (WELLBUTRIN XL) 150 MG 24 hr tablet Take 1 tablet (150 mg total) by mouth daily.   doxazosin (CARDURA) 4 MG tablet TAKE ONE TABLET BY MOUTH AT BEDTIME   fluticasone (FLONASE) 50 MCG/ACT nasal spray PLACE 2 SPRAYS INTO BOTH NOSTRILS DAILY.    fluvoxaMINE (LUVOX) 100 MG tablet Take 3 tablets (300 mg total) by mouth at bedtime.   lamoTRIgine (LAMICTAL) 200 MG tablet Take 1 tablet (200 mg total) by mouth daily.   lurasidone (LATUDA) 80 MG TABS tablet Take 1 tablet (80 mg total) by mouth daily at 12 noon.   Oxcarbazepine (TRILEPTAL) 300 MG tablet TAKE ONE TABLET BY MOUTH EVERY MORNING AND TAKE TWO TABLETS BY MOUTH AT BEDTIME   rivaroxaban (XARELTO) 10 MG TABS tablet Take 1 tablet (10 mg total) by mouth daily. Please schedule office visit before any future refill   topiramate (TOPAMAX) 50 MG tablet TAKE ONE TABLET (50 MG) BY MOUTH EVERY MORNING AND TAKE TWO TABLETS (100 MG) BYMOUTH EVERY EVENING   clonazePAM (KLONOPIN) 0.5 MG tablet TAKE ONE TO TWO TABLETS BY MOUTH AT BEDTIME AS NEEDED FOR SLEEP DISORDER   [DISCONTINUED] levonorgestrel (MIRENA) 20 MCG/24HR IUD by Intrauterine route.   No facility-administered medications prior to visit.    Review of Systems  Constitutional:  Negative for appetite change, chills, fatigue and fever.  Respiratory:  Negative for chest tightness and shortness of breath.   Cardiovascular:  Negative for chest pain and palpitations.  Gastrointestinal:  Negative for abdominal pain, nausea and vomiting.  Neurological:  Negative for dizziness and weakness.       Objective    BP 111/85 (BP Location: Left Arm, Patient Position: Sitting, Cuff Size: Normal)   Pulse 70   Temp  98.8 F (37.1 C) (Oral)   Ht 5\' 9"  (1.753 m)   Wt 161 lb (73 kg)   LMP 09/01/2017   SpO2 99%   BMI 23.78 kg/m    Physical Exam  General appearance: Well developed, well nourished female, cooperative and in no acute distress Head: Normocephalic, without obvious abnormality, atraumatic Respiratory: Respirations even and unlabored, normal respiratory rate Extremities: All extremities are intact.  Skin: Skin color, texture, turgor normal. No rashes seen  Psych: Appropriate mood and affect. Neurologic: Mental status: Alert,  oriented to person, place, and time, thought content appropriate.   Assessment & Plan     Assessment and Plan    Coagulation disorder, history of recurrent PE No reported issues with Xarelto. No signs of unusual bleeding or bruising. No chest pain, heart flutter, or shortness of breath. No swelling or pain in legs. -Continue Xarelto as prescribed.  Vitamin D deficiency Previous low levels of Vitamin D. Patient reported taking supplements for a while but stopped when levels normalized. -Check Vitamin D levels in upcoming blood work.  Hyperlipidemia and borderline elevated blood sugar Last blood work showed slightly elevated blood sugar and cholesterol levels. -Order blood work to check cholesterol, iron levels, and blood sugar.  General Health Maintenance -Continue routine GYN visits with Dr. Saverio Danker at [facility]. -Order routine blood work today as patient is fasting.          Mila Merry, MD  [facility] [phone] (phone) [phone] (fax)  [facility]

## 2023-07-31 ENCOUNTER — Encounter: Payer: Self-pay | Admitting: Psychiatry

## 2023-07-31 ENCOUNTER — Ambulatory Visit (INDEPENDENT_AMBULATORY_CARE_PROVIDER_SITE_OTHER): Payer: 59 | Admitting: Psychiatry

## 2023-07-31 ENCOUNTER — Encounter: Payer: Self-pay | Admitting: Oncology

## 2023-07-31 DIAGNOSIS — E538 Deficiency of other specified B group vitamins: Secondary | ICD-10-CM

## 2023-07-31 DIAGNOSIS — F515 Nightmare disorder: Secondary | ICD-10-CM

## 2023-07-31 DIAGNOSIS — F411 Generalized anxiety disorder: Secondary | ICD-10-CM | POA: Diagnosis not present

## 2023-07-31 DIAGNOSIS — F319 Bipolar disorder, unspecified: Secondary | ICD-10-CM | POA: Diagnosis not present

## 2023-07-31 DIAGNOSIS — F5105 Insomnia due to other mental disorder: Secondary | ICD-10-CM | POA: Diagnosis not present

## 2023-07-31 DIAGNOSIS — R7989 Other specified abnormal findings of blood chemistry: Secondary | ICD-10-CM | POA: Diagnosis not present

## 2023-07-31 DIAGNOSIS — F422 Mixed obsessional thoughts and acts: Secondary | ICD-10-CM

## 2023-07-31 DIAGNOSIS — E611 Iron deficiency: Secondary | ICD-10-CM | POA: Diagnosis not present

## 2023-07-31 DIAGNOSIS — G4752 REM sleep behavior disorder: Secondary | ICD-10-CM

## 2023-07-31 DIAGNOSIS — G2581 Restless legs syndrome: Secondary | ICD-10-CM | POA: Diagnosis not present

## 2023-07-31 DIAGNOSIS — G43009 Migraine without aura, not intractable, without status migrainosus: Secondary | ICD-10-CM

## 2023-07-31 DIAGNOSIS — F4001 Agoraphobia with panic disorder: Secondary | ICD-10-CM | POA: Diagnosis not present

## 2023-07-31 MED ORDER — DOXAZOSIN MESYLATE 4 MG PO TABS
4.0000 mg | ORAL_TABLET | Freq: Every day | ORAL | 1 refills | Status: DC
Start: 2023-07-31 — End: 2024-06-18

## 2023-07-31 MED ORDER — LAMOTRIGINE 200 MG PO TABS
200.0000 mg | ORAL_TABLET | Freq: Every day | ORAL | 1 refills | Status: DC
Start: 2023-07-31 — End: 2024-06-18

## 2023-07-31 MED ORDER — FLUVOXAMINE MALEATE 100 MG PO TABS
300.0000 mg | ORAL_TABLET | Freq: Every day | ORAL | 1 refills | Status: DC
Start: 2023-07-31 — End: 2024-07-16

## 2023-07-31 MED ORDER — LURASIDONE HCL 80 MG PO TABS
80.0000 mg | ORAL_TABLET | Freq: Every day | ORAL | 1 refills | Status: DC
Start: 2023-07-31 — End: 2024-07-16

## 2023-07-31 MED ORDER — TOPIRAMATE 50 MG PO TABS
ORAL_TABLET | ORAL | 1 refills | Status: DC
Start: 2023-07-31 — End: 2024-07-16

## 2023-07-31 MED ORDER — OXCARBAZEPINE 300 MG PO TABS
ORAL_TABLET | ORAL | 1 refills | Status: DC
Start: 2023-07-31 — End: 2024-07-16

## 2023-07-31 MED ORDER — BUPROPION HCL ER (XL) 150 MG PO TB24
150.0000 mg | ORAL_TABLET | Freq: Every day | ORAL | 1 refills | Status: DC
Start: 2023-07-31 — End: 2024-07-16

## 2023-07-31 NOTE — Progress Notes (Signed)
Kristen [name] [id number] 06-Dec-1971 52 y.o.     Subjective:   Patient ID:  Kristen [name] is a 52 y.o. (DOB 1971-04-14) female.  Chief Complaint:  Chief Complaint  Patient presents with   Follow-up    HPI Kristen [name] presents to the office today for follow-up of bipolar disorder and anxiety   07/16/20 appt with the following noted: Thinks topiramate makes her labile and impulsive in speech.  Not me at all.  Didn't start until taking that.  Taking it for migraine and tinnitus.  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  Balance issues. No tx options. Otherwise mood is stable. Staying up late.  Want to stay up all night. Mood lability and irritability and cycling into being too "brave". SX started when increased topiramate to current dose. OCD is a lot better. She thinks Wellbutrin helps depression.  Pleased she's not depressed. Plan: Continue Trileptal to 900 mg dayly.     fluvaxamine 300 mg nightly. Continue Wellbutrin XL 150 every morning Reduce topiramate to 50 mg AM and 100 PM bc she felt it contributed to manic sx  09/29/2020 appointment with the following noted: Better with reduced topiramate with resolution of HA and manic subsided quite a bit. Not depressed.  Mood evened out. No SE meds. Needs refill fo Xanax and takes it nightly.  Mistake and received 0.25 instead of 0.5 mg tablets.  Not needed much daytime for anxiety. Patient reports stable mood and denies depressed or irritable moods.  Patient denies any recent difficulty with anxiety.  Patient denies difficulty with sleep initiation or maintenance. With meds 6-7 hours of sleep.  Denies appetite disturbance.  Patient reports that energy and motivation have been good.  Patient denies any difficulty with concentration.  Patient denies any suicidal ideation. Plan: No med changes  02/28/2021 appointment with the following noted: OK overall.  Some other issues from fall and consussion she had.   Including hearing affected.  Hearing aid.  Memory affected also by Covid per neuro.  Missed work for [employer].  Neuro took her out of work for a couple of months. Mood has been great and stable. Plan: No med changes  08/31/2021 appointment with the following noted: Hearing loss from concussion about 30% and has tinnitus.   Pretty good from mental health perspective.  No mood swings.  Not depressed.  Minimal OCD and anxiety.  No SE with meds. Sleep is weird, sort of sleep walk.  May or may not recognize it.  Talks in sleep.  May yell at [place].  Touches things beside her bed.  Happens 2-3 times per week. Has neuro but not seen since March. History of low B12 and was on shots. 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  08/31/21 TC to pt: Her vitamin D level is much too low and has been historically too low.  That can contribute to fatigue and depression and cognitive problems.  I sent in a prescription for once a week vitamin D and high dose. Her B12 levels have historically been low as well and are currently borderline.  I sent in a prescription for vitamin B12 as well and that may help her energy, concentration, and mood. Please call and give her this information.  12/08/2021 appointment noted: Started B12 and vitamin D.   Overall doing ok.  A little bit of benefit noted with vitamins.  Severe osteoporsis in family. No SE with meds. Patient reports stable mood and denies depressed or irritable moods.  Patient mild recent difficulty with anxiety.  Patient denies difficulty with sleep initiation or maintenance. Denies appetite disturbance.  Patient reports that energy and motivation have been good.  Patient denies any difficulty with concentration.  Patient denies any suicidal ideation. Bad NM once every 2-3 weeks and less than it was. 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 Plan: No med changes  03/14/2022 appointment with the following noted: Still taking Wellbutrin XL 150 mg every morning, clonazepam 0.5 mg  nightly as needed insomnia, fluvoxamine 300, lamotrigine 200, oxcarbazepine 300 every morning and 600 nightly, propranolol 20 mg 3 times daily as needed restlessness, topiramate 50 every morning and 100 nightly and vitamin B12 Doing ok overall.  Iron really low. Causing RLS NM and talking in her sleep bothers H. Awaken her twice weekly.  Occ sleep walking. Clonazepam can give a hangover.  12/28/22 appt noted: Still battles low iron and had 6-8 iron infusions.  And didn't make a big difference at the time.   And expensive and drove down BP. Hard time sleeping and doesn't know why and has NM  when sleeps.  Has gone 2-3 days wituot sleep.   OCD is out of control with death obsessions triggered by mother's carotid artery stent placement and out of anesthesia was agitated.  Scared her to death bc fear she would die.  She is somewhat recovered.    Family won't tell her things bc I get so upset.    Current meds. Still taking Wellbutrin XL 150 mg every morning, clonazepam 0.5 mg nightly as needed insomnia, fluvoxamine 300, lamotrigine 200, oxcarbazepine 300 every morning and 600 nightly, propranolol 20 mg 3 times daily as needed restlessness, topiramate 50 every morning and 100 nightly and vitamin B12.  No doxazosin. She had sinus surgery better. Father in law with dementia and mother in law talking about death.   Even with clonazepam 1.0 mg HS can't sleep.  Don't want to go anywhere.  Sometimes feels hyped up briefly.  Just wants to sit.  Hard to concentrate.  Hard to read.   Checks doors , stove, locks, powerstrips, daughter's room.  Worse. Plan: Never tried doxazosin for NM and sleep talking  2-4 mg HS If this doesnot work then will increase clonazepam Disc dx RBSD.  TX of choice is clonazepam 0.5-1.0 mg HS Restart Latuda 80 mg daily.  02/28/23 appt noted: Still taking Wellbutrin XL 150 mg every morning, clonazepam 0.5 mg nightly as needed insomnia, fluvoxamine 300, lamotrigine 200, oxcarbazepine 300  every morning and 600 nightly, propranolol 20 mg 3 times daily as needed restlessness, topiramate 50 every morning and 100 nightly and vitamin B12.  Doxazosin 4 mg HS helped. Restarted Latuda 80 mg daily. Sleep is better.   Only issue is iron, D, C are low and has new PCP and will be taking supplements.  Doesn't like iron infusions and they are expensive and only work for a couple of mos.   Muscle and bone pain from deficiencies.   Mood has been good and much better with Latuda.  Sleep got better with it too.  No NM now.   Now clonazepam only prn sleep and not daily. Plan no changes   07/31/23 appt noted: No med changes.  No SE.  No clonazepam needed. Doing well with meds.  Patient reports stable mood and denies depressed or irritable moods.  Patient denies any recent difficulty with anxiety.  Patient denies difficulty with sleep initiation or maintenance. Denies appetite disturbance.  Patient reports that energy and motivation  have been good.  Patient denies any difficulty with concentration.  Patient denies any suicidal ideation.  Sleep better helped energy.   No mood swings.   Taking supplements as above. Health is good.  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  Past Psychiatric Medication Trials: Vraylar 3 restless, risperidone nonresponse, Seroquel weight gain, Abilify 10,  Latuda 80 good response,  oxcarbazepine 900 SE,    lamotrigine 300,  lithium,  topiramate  100 mg BID hypomania.  pramipexole,  duloxetine, Lexapro, Wellbutrin, fluoxetine, venlafaxine, sertraline,  fluvoxamine 300 clonazepam, alprazolam,  buspar NR Doxazosin 4   Review of Systems:  Review of Systems  Constitutional:  Negative for fatigue.  HENT:  Positive for hearing loss and tinnitus.   Neurological:  Negative for tremors, weakness and headaches.       RLS returned  Psychiatric/Behavioral:  Negative for confusion, decreased concentration and dysphoric mood. The patient is not nervous/anxious.      Medications: I have reviewed the patient's current medications.  Current Outpatient Medications  Medication Sig Dispense Refill   clonazePAM (KLONOPIN) 0.5 MG tablet TAKE ONE TO TWO TABLETS BY MOUTH AT BEDTIME AS NEEDED FOR SLEEP DISORDER 60 tablet 4   fluticasone (FLONASE) 50 MCG/ACT nasal spray PLACE 2 SPRAYS INTO BOTH NOSTRILS DAILY. 16 g 0   rivaroxaban (XARELTO) 10 MG TABS tablet Take 1 tablet (10 mg total) by mouth daily. 90 tablet 3   buPROPion (WELLBUTRIN XL) 150 MG 24 hr tablet Take 1 tablet (150 mg total) by mouth daily. 90 tablet 1   doxazosin (CARDURA) 4 MG tablet Take 1 tablet (4 mg total) by mouth at bedtime. 90 tablet 1   fluvoxaMINE (LUVOX) 100 MG tablet Take 3 tablets (300 mg total) by mouth at bedtime. 270 tablet 1   lamoTRIgine (LAMICTAL) 200 MG tablet Take 1 tablet (200 mg total) by mouth daily. 90 tablet 1   lurasidone (LATUDA) 80 MG TABS tablet Take 1 tablet (80 mg total) by mouth daily at 12 noon. 90 tablet 1   Oxcarbazepine (TRILEPTAL) 300 MG tablet TAKE ONE TABLET BY MOUTH EVERY MORNING AND TAKE TWO TABLETS BY MOUTH AT BEDTIME 270 tablet 1   topiramate (TOPAMAX) 50 MG tablet TAKE ONE TABLET (50 MG) BY MOUTH EVERY MORNING AND TAKE TWO TABLETS (100 MG) BYMOUTH EVERY EVENING 270 tablet 1   No current facility-administered medications for this visit.    Medication Side Effects: None  Allergies:  Allergies  Allergen Reactions   Codeine Nausea Only    Can tolerate medication when taken with food    Past Medical History:  Diagnosis Date   Anemia    Anxiety    Bipolar disorder (HCC)    Cholelithiasis    DVT (deep venous thrombosis) (HCC)    GERD (gastroesophageal reflux disease)    Kidney stones    PONV (postoperative nausea and vomiting)    Pulmonary embolism (HCC)    RLS (restless legs syndrome)     Family History  Problem Relation Age of Onset   Diabetes Father    Stroke Father    Heart attack Father    Kidney failure Father    Hypertension  Father    Uterine cancer Maternal Grandmother    Ulcers Maternal Grandmother    Diabetes Maternal Grandfather    Stroke Maternal Grandfather    Breast cancer Paternal Grandmother    Colon cancer Paternal Grandfather    Hypertension Paternal Grandfather     Social History   Socioeconomic History   Marital  status: Married    Spouse name: Not on file   Number of children: 3   Years of education: Not on file   Highest education level: Not on file  Occupational History   Occupation: Architectural technologist    Employer: [employer]  Tobacco Use   Smoking status: Never   Smokeless tobacco: Never  Vaping Use   Vaping status: Never Used  Substance and Sexual Activity   Alcohol use: No    Alcohol/week: 0.0 standard drinks of alcohol   Drug use: No   Sexual activity: Yes    Partners: Male    Birth control/protection: I.U.D.    Comment: mirena  Other Topics Concern   Not on file  Social History Narrative   Not on file   Social Determinants of Health   Financial Resource Strain: Not on file  Food Insecurity: Not on file  Transportation Needs: Not on file  Physical Activity: Not on file  Stress: Not on file  Social Connections: Not on file  Intimate Partner Violence: Not on file    Past Medical History, Surgical history, Social history, and Family history were reviewed and updated as appropriate.   Please see review of systems for further details on the patient's review from today.   Objective:   Physical Exam:  LMP 09/01/2017   Physical Exam Constitutional:      General: She is not in acute distress. Musculoskeletal:        General: No deformity.  Neurological:     Mental Status: She is alert and oriented to person, place, and time.     Cranial Nerves: No dysarthria.     Coordination: Coordination normal.  Psychiatric:        Attention and Perception: Attention and perception normal. She does not perceive auditory or visual hallucinations.         Mood and Affect: Mood normal. Mood is not anxious or depressed. Affect is not labile, blunt or angry.        Speech: Speech normal.        Behavior: Behavior normal. Behavior is cooperative.        Thought Content: Thought content normal. Thought content is not paranoid or delusional. Thought content does not include homicidal or suicidal ideation. Thought content does not include suicidal plan.        Cognition and Memory: Cognition is not impaired. She exhibits impaired recent memory.        Judgment: Judgment normal.     Comments: Insight intact Minimal anxiety      Lab Review:     Component Value Date/Time   NA 144 07/25/2023 1113   NA 140 08/15/2012 1624   K 4.1 07/25/2023 1113   K 3.6 08/15/2012 1624   CL 111 (H) 07/25/2023 1113   CL 106 08/15/2012 1624   CO2 20 07/25/2023 1113   CO2 26 08/15/2012 1624   GLUCOSE 97 07/25/2023 1113   GLUCOSE 153 (H) 03/05/2017 2142   GLUCOSE 83 08/15/2012 1624   BUN 11 07/25/2023 1113   BUN 10 08/15/2012 1624   CREATININE 0.78 07/25/2023 1113   CREATININE 0.55 (L) 08/15/2012 1624   CALCIUM 8.9 07/25/2023 1113   CALCIUM 9.0 08/15/2012 1624   PROT 5.7 (L) 07/25/2023 1113   PROT 6.5 08/15/2012 1624   ALBUMIN 3.8 07/25/2023 1113   ALBUMIN 3.4 08/15/2012 1624   AST 21 07/25/2023 1113   AST 22 08/15/2012 1624   ALT 18 07/25/2023 1113   ALT 23  08/15/2012 1624   ALKPHOS 96 07/25/2023 1113   ALKPHOS 98 08/15/2012 1624   BILITOT 0.3 07/25/2023 1113   BILITOT 0.8 08/15/2012 1624   GFRNONAA 112 07/01/2018 1103   GFRNONAA >60 08/15/2012 1624   GFRAA 129 07/01/2018 1103   GFRAA >60 08/15/2012 1624       Component Value Date/Time   WBC 4.9 07/25/2023 1113   WBC 5.8 06/06/2022 1515   RBC 4.71 07/25/2023 1113   RBC 4.07 06/06/2022 1515   HGB 14.1 07/25/2023 1113   HCT 42.9 07/25/2023 1113   PLT 334 07/25/2023 1113   MCV 91 07/25/2023 1113   MCV 91 08/15/2012 1624   MCH 29.9 07/25/2023 1113   MCH 23.8 (L) 06/06/2022 1515   MCHC 32.9  07/25/2023 1113   MCHC 29.8 (L) 06/06/2022 1515   RDW 13.9 07/25/2023 1113   RDW 15.7 (H) 08/15/2012 1624   LYMPHSABS 1.2 06/06/2022 1515   LYMPHSABS 1.5 10/08/2015 1107   LYMPHSABS 1.9 08/15/2012 1624   MONOABS 0.6 06/06/2022 1515   MONOABS 0.8 08/15/2012 1624   EOSABS 0.6 (H) 06/06/2022 1515   EOSABS 0.1 10/08/2015 1107   EOSABS 0.2 08/15/2012 1624   BASOSABS 0.0 06/06/2022 1515   BASOSABS 0.0 10/08/2015 1107   BASOSABS 0.0 08/15/2012 1624    No results found for: "POCLITH", "LITHIUM"   No results found for: "PHENYTOIN", "PHENOBARB", "VALPROATE", "CBMZ"   11/01/2021 vitamin D 16, B12 295  02/09/22 Ferritin low 8  .res Assessment: Plan:    Luka was seen today for follow-up.  Diagnoses and all orders for this visit:  Bipolar I disorder (HCC) -     buPROPion (WELLBUTRIN XL) 150 MG 24 hr tablet; Take 1 tablet (150 mg total) by mouth daily. -     lamoTRIgine (LAMICTAL) 200 MG tablet; Take 1 tablet (200 mg total) by mouth daily. -     lurasidone (LATUDA) 80 MG TABS tablet; Take 1 tablet (80 mg total) by mouth daily at 12 noon. -     Oxcarbazepine (TRILEPTAL) 300 MG tablet; TAKE ONE TABLET BY MOUTH EVERY MORNING AND TAKE TWO TABLETS BY MOUTH AT BEDTIME  Mixed obsessional thoughts and acts -     fluvoxaMINE (LUVOX) 100 MG tablet; Take 3 tablets (300 mg total) by mouth at bedtime. -     lurasidone (LATUDA) 80 MG TABS tablet; Take 1 tablet (80 mg total) by mouth daily at 12 noon.  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       30-minute face to face time with patient was spent on counseling and coordination of care. We discussed Good response and stability with psych meds when on Latuda.  However had to stop [name] DT cost.  This was very unfortunate.  Historically she has relapse with symptoms whenever she has stopped [name].  She did ok without it for awhile then had relapse of sx including: Since stopping the [name] she had no marked depression or  mania but had unexpected recurrence of obsessive-compulsive symptoms.  She had these types of symptoms as a teenager but has not had severe symptoms as an adult and recent years.  The increase in Trileptal to 900 mg daily did not help the OCD.   Latuda 80 mg and sx resolved again.  History of gastric bypass and secondary history low B12 and iron She is taking oral B12  And added vitamin D   Continue Trileptal to 900 mg daily.    Informed her that we may have to go much higher and to particularly let us know if this insomnia persist.  Disc the off-label use of N-Acetylcysteine at 600 mg daily to help with mild cognitive problems.  It can be combined with a B-complex vitamin as the B-12 and folate have been shown to sometimes enhance the effect.   Discussed side effects of each med in detail.    fluvoxamine 300 mg nightly.  OCD betterl controlled right now. Disc DDI with Xanax and use lower dosage.    Continue topiramate 50 mg in the morning and 100 mg in the evening.  The reduction resolved hypomania.  This is controlling headache  Continue Wellbutrin XL 150 mg every morning because it helped with depression and she is tolerating it well.  tried doxazosin for NM and sleep talking  2-4 mg HS and it worked. Disc dx RBSD.    We discussed the short-term risks associated with benzodiazepines including sedation and increased fall risk among others.  Discussed long-term side effect risk including  dependence, potential withdrawal symptoms, and the potential eventual dose-related risk of dementia.  But recent studies from 2020 dispute this association between benzodiazepines and dementia risk. Newer studies in 2020 do not support an association with dementia. No clonazepam needed since here  No med changes  Follow-up 6 mos  Meredith Staggers MD, DFAPA  Please see After Visit Summary for patient specific instructions.  No future appointments.      No orders of the defined types were placed in this encounter.    -------------------------------

## 2023-08-13 DIAGNOSIS — M9902 Segmental and somatic dysfunction of thoracic region: Secondary | ICD-10-CM | POA: Diagnosis not present

## 2023-08-13 DIAGNOSIS — M542 Cervicalgia: Secondary | ICD-10-CM | POA: Diagnosis not present

## 2023-08-13 DIAGNOSIS — R519 Headache, unspecified: Secondary | ICD-10-CM | POA: Diagnosis not present

## 2023-08-13 DIAGNOSIS — M9901 Segmental and somatic dysfunction of cervical region: Secondary | ICD-10-CM | POA: Diagnosis not present

## 2023-08-14 DIAGNOSIS — M9901 Segmental and somatic dysfunction of cervical region: Secondary | ICD-10-CM | POA: Diagnosis not present

## 2023-08-14 DIAGNOSIS — M9902 Segmental and somatic dysfunction of thoracic region: Secondary | ICD-10-CM | POA: Diagnosis not present

## 2023-08-14 DIAGNOSIS — R519 Headache, unspecified: Secondary | ICD-10-CM | POA: Diagnosis not present

## 2023-08-14 DIAGNOSIS — M542 Cervicalgia: Secondary | ICD-10-CM | POA: Diagnosis not present

## 2023-08-16 DIAGNOSIS — M9901 Segmental and somatic dysfunction of cervical region: Secondary | ICD-10-CM | POA: Diagnosis not present

## 2023-08-16 DIAGNOSIS — M542 Cervicalgia: Secondary | ICD-10-CM | POA: Diagnosis not present

## 2023-08-16 DIAGNOSIS — R519 Headache, unspecified: Secondary | ICD-10-CM | POA: Diagnosis not present

## 2023-08-16 DIAGNOSIS — M9902 Segmental and somatic dysfunction of thoracic region: Secondary | ICD-10-CM | POA: Diagnosis not present

## 2023-08-21 DIAGNOSIS — R519 Headache, unspecified: Secondary | ICD-10-CM | POA: Diagnosis not present

## 2023-08-21 DIAGNOSIS — M542 Cervicalgia: Secondary | ICD-10-CM | POA: Diagnosis not present

## 2023-08-21 DIAGNOSIS — M9901 Segmental and somatic dysfunction of cervical region: Secondary | ICD-10-CM | POA: Diagnosis not present

## 2023-08-21 DIAGNOSIS — M9902 Segmental and somatic dysfunction of thoracic region: Secondary | ICD-10-CM | POA: Diagnosis not present

## 2023-08-22 DIAGNOSIS — M542 Cervicalgia: Secondary | ICD-10-CM | POA: Diagnosis not present

## 2023-08-22 DIAGNOSIS — M9902 Segmental and somatic dysfunction of thoracic region: Secondary | ICD-10-CM | POA: Diagnosis not present

## 2023-08-22 DIAGNOSIS — M9901 Segmental and somatic dysfunction of cervical region: Secondary | ICD-10-CM | POA: Diagnosis not present

## 2023-08-22 DIAGNOSIS — R519 Headache, unspecified: Secondary | ICD-10-CM | POA: Diagnosis not present

## 2023-08-23 DIAGNOSIS — M9901 Segmental and somatic dysfunction of cervical region: Secondary | ICD-10-CM | POA: Diagnosis not present

## 2023-08-23 DIAGNOSIS — M9902 Segmental and somatic dysfunction of thoracic region: Secondary | ICD-10-CM | POA: Diagnosis not present

## 2023-08-23 DIAGNOSIS — M542 Cervicalgia: Secondary | ICD-10-CM | POA: Diagnosis not present

## 2023-08-23 DIAGNOSIS — R519 Headache, unspecified: Secondary | ICD-10-CM | POA: Diagnosis not present

## 2023-08-27 DIAGNOSIS — M542 Cervicalgia: Secondary | ICD-10-CM | POA: Diagnosis not present

## 2023-08-27 DIAGNOSIS — M9901 Segmental and somatic dysfunction of cervical region: Secondary | ICD-10-CM | POA: Diagnosis not present

## 2023-08-27 DIAGNOSIS — R519 Headache, unspecified: Secondary | ICD-10-CM | POA: Diagnosis not present

## 2023-08-27 DIAGNOSIS — M9902 Segmental and somatic dysfunction of thoracic region: Secondary | ICD-10-CM | POA: Diagnosis not present

## 2023-08-29 DIAGNOSIS — M542 Cervicalgia: Secondary | ICD-10-CM | POA: Diagnosis not present

## 2023-08-29 DIAGNOSIS — M9902 Segmental and somatic dysfunction of thoracic region: Secondary | ICD-10-CM | POA: Diagnosis not present

## 2023-08-29 DIAGNOSIS — M9901 Segmental and somatic dysfunction of cervical region: Secondary | ICD-10-CM | POA: Diagnosis not present

## 2023-08-29 DIAGNOSIS — R519 Headache, unspecified: Secondary | ICD-10-CM | POA: Diagnosis not present

## 2023-08-30 DIAGNOSIS — M9902 Segmental and somatic dysfunction of thoracic region: Secondary | ICD-10-CM | POA: Diagnosis not present

## 2023-08-30 DIAGNOSIS — R519 Headache, unspecified: Secondary | ICD-10-CM | POA: Diagnosis not present

## 2023-08-30 DIAGNOSIS — M9901 Segmental and somatic dysfunction of cervical region: Secondary | ICD-10-CM | POA: Diagnosis not present

## 2023-08-30 DIAGNOSIS — M542 Cervicalgia: Secondary | ICD-10-CM | POA: Diagnosis not present

## 2023-09-04 DIAGNOSIS — M9902 Segmental and somatic dysfunction of thoracic region: Secondary | ICD-10-CM | POA: Diagnosis not present

## 2023-09-04 DIAGNOSIS — R519 Headache, unspecified: Secondary | ICD-10-CM | POA: Diagnosis not present

## 2023-09-04 DIAGNOSIS — M542 Cervicalgia: Secondary | ICD-10-CM | POA: Diagnosis not present

## 2023-09-04 DIAGNOSIS — M9901 Segmental and somatic dysfunction of cervical region: Secondary | ICD-10-CM | POA: Diagnosis not present

## 2023-09-06 DIAGNOSIS — M9901 Segmental and somatic dysfunction of cervical region: Secondary | ICD-10-CM | POA: Diagnosis not present

## 2023-09-06 DIAGNOSIS — M542 Cervicalgia: Secondary | ICD-10-CM | POA: Diagnosis not present

## 2023-09-06 DIAGNOSIS — R519 Headache, unspecified: Secondary | ICD-10-CM | POA: Diagnosis not present

## 2023-09-06 DIAGNOSIS — M9902 Segmental and somatic dysfunction of thoracic region: Secondary | ICD-10-CM | POA: Diagnosis not present

## 2023-09-10 ENCOUNTER — Ambulatory Visit (INDEPENDENT_AMBULATORY_CARE_PROVIDER_SITE_OTHER): Payer: 59 | Admitting: Professional Counselor

## 2023-09-10 ENCOUNTER — Encounter: Payer: Self-pay | Admitting: Professional Counselor

## 2023-09-10 DIAGNOSIS — F411 Generalized anxiety disorder: Secondary | ICD-10-CM | POA: Diagnosis not present

## 2023-09-10 DIAGNOSIS — F319 Bipolar disorder, unspecified: Secondary | ICD-10-CM | POA: Diagnosis not present
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
## 2023-09-11 DIAGNOSIS — R519 Headache, unspecified: Secondary | ICD-10-CM | POA: Diagnosis not present

## 2023-09-11 DIAGNOSIS — M542 Cervicalgia: Secondary | ICD-10-CM | POA: Diagnosis not present

## 2023-09-11 DIAGNOSIS — M9901 Segmental and somatic dysfunction of cervical region: Secondary | ICD-10-CM | POA: Diagnosis not present

## 2023-09-11 DIAGNOSIS — M9902 Segmental and somatic dysfunction of thoracic region: Secondary | ICD-10-CM | POA: Diagnosis not present

## 2023-09-12 ENCOUNTER — Other Ambulatory Visit: Payer: Self-pay | Admitting: Family Medicine

## 2023-09-12 DIAGNOSIS — J301 Allergic rhinitis due to pollen: Secondary | ICD-10-CM

## 2023-09-13 DIAGNOSIS — M542 Cervicalgia: Secondary | ICD-10-CM | POA: Diagnosis not present

## 2023-09-13 DIAGNOSIS — R519 Headache, unspecified: Secondary | ICD-10-CM | POA: Diagnosis not present

## 2023-09-13 DIAGNOSIS — M9902 Segmental and somatic dysfunction of thoracic region: Secondary | ICD-10-CM | POA: Diagnosis not present

## 2023-09-13 DIAGNOSIS — M9901 Segmental and somatic dysfunction of cervical region: Secondary | ICD-10-CM | POA: Diagnosis not present

## 2023-09-18 ENCOUNTER — Other Ambulatory Visit: Payer: Self-pay | Admitting: Psychiatry

## 2023-09-18 DIAGNOSIS — M9901 Segmental and somatic dysfunction of cervical region: Secondary | ICD-10-CM | POA: Diagnosis not present

## 2023-09-18 DIAGNOSIS — M542 Cervicalgia: Secondary | ICD-10-CM | POA: Diagnosis not present

## 2023-09-18 DIAGNOSIS — M9902 Segmental and somatic dysfunction of thoracic region: Secondary | ICD-10-CM | POA: Diagnosis not present

## 2023-09-18 DIAGNOSIS — G4752 REM sleep behavior disorder: Secondary | ICD-10-CM

## 2023-09-18 DIAGNOSIS — R519 Headache, unspecified: Secondary | ICD-10-CM | POA: Diagnosis not present

## 2023-09-18 NOTE — Telephone Encounter (Signed)
Refill seems appropriate. LV 08/13//24 ( office note states: "no clonazepam needed since here") LF 05/03/23; NV 02/03/23

## 2023-09-20 DIAGNOSIS — M9901 Segmental and somatic dysfunction of cervical region: Secondary | ICD-10-CM | POA: Diagnosis not present

## 2023-09-20 DIAGNOSIS — M542 Cervicalgia: Secondary | ICD-10-CM | POA: Diagnosis not present

## 2023-09-20 DIAGNOSIS — R519 Headache, unspecified: Secondary | ICD-10-CM | POA: Diagnosis not present

## 2023-09-20 DIAGNOSIS — M9902 Segmental and somatic dysfunction of thoracic region: Secondary | ICD-10-CM | POA: Diagnosis not present

## 2023-09-25 DIAGNOSIS — M9901 Segmental and somatic dysfunction of cervical region: Secondary | ICD-10-CM | POA: Diagnosis not present

## 2023-09-25 DIAGNOSIS — M542 Cervicalgia: Secondary | ICD-10-CM | POA: Diagnosis not present

## 2023-09-25 DIAGNOSIS — M9902 Segmental and somatic dysfunction of thoracic region: Secondary | ICD-10-CM | POA: Diagnosis not present

## 2023-09-25 DIAGNOSIS — R519 Headache, unspecified: Secondary | ICD-10-CM | POA: Diagnosis not present

## 2023-10-02 DIAGNOSIS — M9901 Segmental and somatic dysfunction of cervical region: Secondary | ICD-10-CM | POA: Diagnosis not present

## 2023-10-02 DIAGNOSIS — M9902 Segmental and somatic dysfunction of thoracic region: Secondary | ICD-10-CM | POA: Diagnosis not present

## 2023-10-02 DIAGNOSIS — R519 Headache, unspecified: Secondary | ICD-10-CM | POA: Diagnosis not present

## 2023-10-02 DIAGNOSIS — M542 Cervicalgia: Secondary | ICD-10-CM | POA: Diagnosis not present

## 2023-10-09 DIAGNOSIS — R519 Headache, unspecified: Secondary | ICD-10-CM | POA: Diagnosis not present

## 2023-10-09 DIAGNOSIS — M9902 Segmental and somatic dysfunction of thoracic region: Secondary | ICD-10-CM | POA: Diagnosis not present

## 2023-10-09 DIAGNOSIS — M9901 Segmental and somatic dysfunction of cervical region: Secondary | ICD-10-CM | POA: Diagnosis not present

## 2023-10-09 DIAGNOSIS — M542 Cervicalgia: Secondary | ICD-10-CM | POA: Diagnosis not present

## 2023-10-11 ENCOUNTER — Encounter: Payer: Self-pay | Admitting: Professional Counselor

## 2023-10-11 ENCOUNTER — Ambulatory Visit (INDEPENDENT_AMBULATORY_CARE_PROVIDER_SITE_OTHER): Payer: 59 | Admitting: Professional Counselor

## 2023-10-11 DIAGNOSIS — F319 Bipolar disorder, unspecified: Secondary | ICD-10-CM | POA: Diagnosis not present

## 2023-10-11 DIAGNOSIS — F411 Generalized anxiety disorder: Secondary | ICD-10-CM
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
## 2023-10-11 NOTE — Progress Notes (Deleted)
.  ETB

## 2023-10-18 NOTE — Progress Notes (Incomplete)
      [facility] Counselor/Therapist Progress Note  Patient ID: Kristen [name], MRN: [id number],    Date: 10/11/2023  Time Spent: 1:10p - 2:09p   Treatment Type: Individual Therapy  Reported Symptoms: restlessness, worries, fears, frustration, sadness, irritibaility, inteprneal conecrns   Mental Status Exam:  Appearance:   Neat     Behavior:  Appropriate, Sharing, and Motivated  Motor:  Normal  Speech/Language:   Clear and Coherent and Normal Rate  Affect:  Appropriate and Congruent  Mood:  normal  Thought process:  normal  Thought content:    WNL  Sensory/Perceptual disturbances:    WNL  Orientation:  Sound  Attention:  Good  Concentration:  Good  Memory:  WNL  Fund of knowledge:   Good  Insight:    Good  Judgment:   Good  Impulse Control:  Good   Risk Assessment: Danger to Self:  No Self-injurious Behavior: No Danger to Others: No Duty to Warn:no Physical Aggression / Violence:No  Access to Firearms a concern: No  Gang Involvement:No   Subjective: Pt prnsted to sesison reprotign her anxiety to be very high at this time. She procssed expreince of her husband's mother having health conercns and also exacbtaion of mood. Pt reported experience of family memebrs often beltitiling her and invalidating her feelings and expericne; she idneiiied that she often feeles thsat she must poeroform for toehrs and mask her true feelings, since to do so often backfires. She processed sensn that she   Interventions: Solution-Oriented/Positive Psychology, Humanistic/Existential, and Insight-Oriented  Diagnosis:   ICD-10-CM   1. Bipolar I disorder (HCC)  F31.9     2. Generalized anxiety disorder  F41.1       Plan: ***  Gaspar Bidding, [facility]

## 2023-10-23 DIAGNOSIS — M542 Cervicalgia: Secondary | ICD-10-CM | POA: Diagnosis not present

## 2023-10-23 DIAGNOSIS — M9901 Segmental and somatic dysfunction of cervical region: Secondary | ICD-10-CM | POA: Diagnosis not present

## 2023-10-23 DIAGNOSIS — M9902 Segmental and somatic dysfunction of thoracic region: Secondary | ICD-10-CM | POA: Diagnosis not present

## 2023-10-23 DIAGNOSIS — R519 Headache, unspecified: Secondary | ICD-10-CM | POA: Diagnosis not present

## 2023-10-24 ENCOUNTER — Ambulatory Visit (INDEPENDENT_AMBULATORY_CARE_PROVIDER_SITE_OTHER): Payer: 59 | Admitting: Professional Counselor

## 2023-10-24 ENCOUNTER — Encounter: Payer: Self-pay | Admitting: Professional Counselor

## 2023-10-24 DIAGNOSIS — F319 Bipolar disorder, unspecified: Secondary | ICD-10-CM

## 2023-10-24 DIAGNOSIS — F411 Generalized anxiety disorder: Secondary | ICD-10-CM
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
## 2023-11-06 DIAGNOSIS — M542 Cervicalgia: Secondary | ICD-10-CM | POA: Diagnosis not present

## 2023-11-06 DIAGNOSIS — M9901 Segmental and somatic dysfunction of cervical region: Secondary | ICD-10-CM | POA: Diagnosis not present

## 2023-11-06 DIAGNOSIS — M9902 Segmental and somatic dysfunction of thoracic region: Secondary | ICD-10-CM | POA: Diagnosis not present

## 2023-11-06 DIAGNOSIS — R519 Headache, unspecified: Secondary | ICD-10-CM | POA: Diagnosis not present

## 2023-11-08 ENCOUNTER — Ambulatory Visit (INDEPENDENT_AMBULATORY_CARE_PROVIDER_SITE_OTHER): Payer: 59 | Admitting: Professional Counselor

## 2023-11-08 ENCOUNTER — Encounter: Payer: Self-pay | Admitting: Professional Counselor

## 2023-11-08 DIAGNOSIS — F411 Generalized anxiety disorder: Secondary | ICD-10-CM | POA: Diagnosis not present

## 2023-11-08 DIAGNOSIS — F319 Bipolar disorder, unspecified: Secondary | ICD-10-CM

## 2023-11-08 NOTE — Progress Notes (Signed)
      [facility] Counselor/Therapist Progress Note  Patient ID: Kristen [name], MRN: [id number],    Date: 11/08/2023  Time Spent: 4:15 PM to 5:15 PM  Treatment Type: Individual Therapy  Reported Symptoms: Intermittent low mood, restlessness, worries  Mental Status Exam:  Appearance:   Neat     Behavior:  Appropriate and Sharing  Motor:  Normal  Speech/Language:   Clear and Coherent and Normal Rate  Affect:  Appropriate and Blunt  Mood:  normal  Thought process:  normal  Thought content:    WNL  Sensory/Perceptual disturbances:    WNL  Orientation:  Sound  Attention:  Good  Concentration:  Good  Memory:  WNL  Fund of knowledge:   Good  Insight:    Good  Judgment:   Good  Impulse Control:  Good   Risk Assessment: Danger to Self:  No Self-injurious Behavior: No Danger to Others: No Duty to Warn:no Physical Aggression / Violence:No  Access to Firearms a concern: No  Gang Involvement:No   Subjective: Patient presented to session to address concerns of bipolar disorder and anxiety.  She reported progress since last session.  She communicated with her husband after session and self advocated with care so as to be accountable for herself and her behavior while not globalizing and self deprecating.  She also reconciled with son, apologized, and processed events with him.  Patient reflected on pattern of suppressing her own needs to which point they precipitate into acting out episodes.  She voiced desire to continue to work on assertiveness skills.  Patient voiced considerable concern for her mother however relieved to have had productive conversation with her about her concerns.  Patient identified short-term goals of continued practice of self soothing, and voicing needs with gentle approach prior to letting things build up.  Interventions: Solution-Oriented/Positive Psychology, Humanistic/Existential, and Insight-Oriented  Diagnosis:   ICD-10-CM   1. Bipolar I disorder  (HCC)  F31.9     2. Generalized anxiety disorder  F41.1       Plan: Patient is scheduled for follow-up; continue process work and developing coping skills.  Patient to practice self soothing and assertiveness skills for her needs per identified short-term goal between sessions.  Gaspar Bidding, [facility]

## 2023-11-12 DIAGNOSIS — Z6824 Body mass index (BMI) 24.0-24.9, adult: Secondary | ICD-10-CM | POA: Diagnosis not present

## 2023-11-12 DIAGNOSIS — J029 Acute pharyngitis, unspecified: Secondary | ICD-10-CM | POA: Diagnosis not present

## 2023-11-12 DIAGNOSIS — R051 Acute cough: Secondary | ICD-10-CM | POA: Diagnosis not present

## 2023-11-12 DIAGNOSIS — M791 Myalgia, unspecified site: Secondary | ICD-10-CM | POA: Diagnosis not present

## 2023-11-20 DIAGNOSIS — M9901 Segmental and somatic dysfunction of cervical region: Secondary | ICD-10-CM | POA: Diagnosis not present

## 2023-11-20 DIAGNOSIS — R519 Headache, unspecified: Secondary | ICD-10-CM | POA: Diagnosis not present

## 2023-11-20 DIAGNOSIS — M9902 Segmental and somatic dysfunction of thoracic region: Secondary | ICD-10-CM | POA: Diagnosis not present

## 2023-11-20 DIAGNOSIS — M542 Cervicalgia: Secondary | ICD-10-CM | POA: Diagnosis not present

## 2023-11-22 ENCOUNTER — Ambulatory Visit: Payer: 59 | Admitting: Professional Counselor

## 2023-12-04 DIAGNOSIS — M9902 Segmental and somatic dysfunction of thoracic region: Secondary | ICD-10-CM | POA: Diagnosis not present

## 2023-12-04 DIAGNOSIS — M542 Cervicalgia: Secondary | ICD-10-CM | POA: Diagnosis not present

## 2023-12-04 DIAGNOSIS — M9901 Segmental and somatic dysfunction of cervical region: Secondary | ICD-10-CM | POA: Diagnosis not present

## 2023-12-04 DIAGNOSIS — R519 Headache, unspecified: Secondary | ICD-10-CM | POA: Diagnosis not present

## 2023-12-06 ENCOUNTER — Ambulatory Visit: Payer: 59 | Admitting: Professional Counselor

## 2023-12-18 DIAGNOSIS — R519 Headache, unspecified: Secondary | ICD-10-CM | POA: Diagnosis not present

## 2023-12-18 DIAGNOSIS — M542 Cervicalgia: Secondary | ICD-10-CM | POA: Diagnosis not present

## 2023-12-18 DIAGNOSIS — M9901 Segmental and somatic dysfunction of cervical region: Secondary | ICD-10-CM | POA: Diagnosis not present

## 2023-12-18 DIAGNOSIS — M9902 Segmental and somatic dysfunction of thoracic region: Secondary | ICD-10-CM | POA: Diagnosis not present

## 2023-12-20 ENCOUNTER — Encounter: Payer: Self-pay | Admitting: Professional Counselor

## 2023-12-20 ENCOUNTER — Ambulatory Visit (INDEPENDENT_AMBULATORY_CARE_PROVIDER_SITE_OTHER): Payer: 59 | Admitting: Professional Counselor

## 2023-12-20 DIAGNOSIS — F411 Generalized anxiety disorder: Secondary | ICD-10-CM | POA: Diagnosis not present

## 2023-12-20 DIAGNOSIS — F319 Bipolar disorder, unspecified: Secondary | ICD-10-CM

## 2023-12-20 NOTE — Progress Notes (Signed)
      [facility] Counselor/Therapist Progress Note  Patient ID: Kristen [name], MRN: [id number],    Date: 12/20/2023  Time Spent: 3:14 PM to 4:13 PM  Treatment Type: Individual Therapy  Reported Symptoms: Restlessness, worries, overthinking, mind racing, fears, tearfulness, intermittent low mood  Mental Status Exam:  Appearance:   Neat     Behavior:  Appropriate and Sharing  Motor:  Normal  Speech/Language:   Clear and Coherent and Normal Rate  Affect:  Appropriate and Congruent  Mood:  normal  Thought process:  normal  Thought content:    WNL  Sensory/Perceptual disturbances:    WNL  Orientation:  oriented to person, place, time/date, and situation  Attention:  Good  Concentration:  Good  Memory:  WNL  Fund of knowledge:   Good  Insight:    Good  Judgment:   Good  Impulse Control:  Good   Risk Assessment: Danger to Self:  No Self-injurious Behavior: No Danger to Others: No Duty to Warn:no Physical Aggression / Violence:No  Access to Firearms a concern: No  Gang Involvement:No   Subjective: Patient presented to session to address concerns of bipolar and anxiety.  She voiced mixed progress at this time. She reported family to be staying very close to her and not having a lot of time alone, which she feels is protective but she also finds irritating.  She processed experience of work stress and related challenges she faces.  Patient processed her deep value that she not let anyone down, and that she has an overarching fear of people leaving her.  Counselor and patient discussed her fear of abandonment at length, including as relates her relationship with her brother.  Patient voiced the sense that this fear would never go away, and counselor worked to instill hope and assist patient in reframing negative thinking pattern.  Interventions: Solution-Oriented/Positive Psychology, Humanistic/Existential, and Insight-Oriented, CBT  Diagnosis:   ICD-10-CM   1. Bipolar I  disorder (HCC)  F31.9     2. Generalized anxiety disorder  F41.1       Plan: Patient is scheduled for follow-up; continue process work and developing coping skills.  Patient to work on short-term goal between session of asking her family for more time to herself to restore, and to reflect via journaling on her fear of abandonment, its origin and her feelings and experience around the fear.  Progress note was dictated with Dragon and reviewed for accuracy.  Almarie ONEIDA Sprang, [facility]

## 2023-12-31 ENCOUNTER — Telehealth: Payer: Self-pay | Admitting: Family Medicine

## 2023-12-31 NOTE — Telephone Encounter (Signed)
 Received a fax from covermymeds for Xarelto 10mg   Key: [id number]

## 2024-01-01 DIAGNOSIS — M9902 Segmental and somatic dysfunction of thoracic region: Secondary | ICD-10-CM | POA: Diagnosis not present

## 2024-01-01 DIAGNOSIS — M542 Cervicalgia: Secondary | ICD-10-CM | POA: Diagnosis not present

## 2024-01-01 DIAGNOSIS — M9901 Segmental and somatic dysfunction of cervical region: Secondary | ICD-10-CM | POA: Diagnosis not present

## 2024-01-01 DIAGNOSIS — R519 Headache, unspecified: Secondary | ICD-10-CM | POA: Diagnosis not present

## 2024-01-03 ENCOUNTER — Ambulatory Visit: Payer: 59 | Admitting: Professional Counselor

## 2024-01-15 DIAGNOSIS — R519 Headache, unspecified: Secondary | ICD-10-CM | POA: Diagnosis not present

## 2024-01-15 DIAGNOSIS — M542 Cervicalgia: Secondary | ICD-10-CM | POA: Diagnosis not present

## 2024-01-15 DIAGNOSIS — M9901 Segmental and somatic dysfunction of cervical region: Secondary | ICD-10-CM | POA: Diagnosis not present

## 2024-01-15 DIAGNOSIS — M9902 Segmental and somatic dysfunction of thoracic region: Secondary | ICD-10-CM | POA: Diagnosis not present

## 2024-01-17 ENCOUNTER — Ambulatory Visit: Payer: 59 | Admitting: Professional Counselor

## 2024-01-31 ENCOUNTER — Ambulatory Visit: Payer: 59 | Admitting: Professional Counselor

## 2024-02-04 ENCOUNTER — Ambulatory Visit (INDEPENDENT_AMBULATORY_CARE_PROVIDER_SITE_OTHER): Payer: 59 | Admitting: Psychiatry

## 2024-02-04 DIAGNOSIS — Z6824 Body mass index (BMI) 24.0-24.9, adult: Secondary | ICD-10-CM | POA: Diagnosis not present

## 2024-02-04 DIAGNOSIS — J09X2 Influenza due to identified novel influenza A virus with other respiratory manifestations: Secondary | ICD-10-CM | POA: Diagnosis not present

## 2024-02-04 DIAGNOSIS — R051 Acute cough: Secondary | ICD-10-CM | POA: Diagnosis not present

## 2024-02-04 DIAGNOSIS — M791 Myalgia, unspecified site: Secondary | ICD-10-CM | POA: Diagnosis not present

## 2024-02-04 DIAGNOSIS — Z1152 Encounter for screening for COVID-19: Secondary | ICD-10-CM | POA: Diagnosis not present

## 2024-02-04 NOTE — Progress Notes (Signed)
No show.  [place]

## 2024-02-19 ENCOUNTER — Encounter: Payer: Self-pay | Admitting: Professional Counselor

## 2024-02-19 ENCOUNTER — Ambulatory Visit (INDEPENDENT_AMBULATORY_CARE_PROVIDER_SITE_OTHER): Payer: 59 | Admitting: Professional Counselor

## 2024-02-19 DIAGNOSIS — F319 Bipolar disorder, unspecified: Secondary | ICD-10-CM

## 2024-02-19 NOTE — Progress Notes (Signed)
      [facility] Counselor/Therapist Progress Note  Patient ID: Kristen [name], MRN: [id number],    Date: 03/16/2024  Time Spent: 4:10 PM to 5:10 PM  Treatment Type: Individual Therapy  Reported Symptoms: Trouble relaxing, catastrophic thinking  Mental Status Exam:  Appearance:   Neat     Behavior:  Appropriate, Sharing, and Motivated  Motor:  Normal  Speech/Language:   Clear and Coherent and Normal Rate  Affect:  Appropriate and Congruent  Mood:  normal  Thought process:  normal  Thought content:    WNL  Sensory/Perceptual disturbances:    WNL  Orientation:  oriented to person, place, time/date, and situation  Attention:  Good  Concentration:  Good  Memory:  WNL  Fund of knowledge:   Good  Insight:    Good  Judgment:   Good  Impulse Control:  Good   Risk Assessment: Danger to Self:  No Self-injurious Behavior: No Danger to Others: No Duty to Warn:no Physical Aggression / Violence:No  Access to Firearms a concern: No  Gang Involvement:No   Subjective: Patient presented to session to address concerns of bipolar and anxiety.  She reported significant progress.  Counselor facilitated GAD-7 with a score of 3 and PHQ-9 with a score of 0.  Counselor and patient discussed results, and celebrated patient progress together.  Patient reported no depressive symptomology at this time, and minimal anxiety, endorsing some trouble relaxing and some catastrophic thinking intermittently.  She endorsed no experience of emotional dysregulation or any vague SI or self-harm tendencies.  Patient identified interpersonal relationships as being stable and healthy, and for her to be considering career change to improve her overall quality of life.  Patient voiced desire to continue to come to counseling for maintenance sessions in spite of today's marked progress.  Counselor affirmed patient and reinforced positive coping skills including communication with support persons and resourcing of  self-care and relaxation techniques.  Interventions: Solution-Oriented/Positive Psychology, Humanistic/Existential, and Insight-Oriented  Diagnosis:   ICD-10-CM   1. Bipolar I disorder (HCC)  F31.9       Plan: Patient is scheduled for a follow-up; continue process work and developing coping skills.  Continue to monitor patient progress.  Patient to continue to work on self-care and resourcing of coping skills.  Progress note was dictated with Dragon and reviewed for accuracy.  Gaspar Bidding, [facility]

## 2024-03-04 ENCOUNTER — Ambulatory Visit: Payer: 59 | Admitting: Professional Counselor

## 2024-03-17 DIAGNOSIS — S022XXA Fracture of nasal bones, initial encounter for closed fracture: Secondary | ICD-10-CM | POA: Diagnosis not present

## 2024-03-18 ENCOUNTER — Other Ambulatory Visit: Payer: Self-pay | Admitting: Otolaryngology

## 2024-03-18 ENCOUNTER — Encounter: Payer: Self-pay | Admitting: Otolaryngology

## 2024-03-18 NOTE — H&P (Signed)
 Chief Complaints: 1. deviated septum HPI: This is a 53 year old female who: is being seen for a chief complaint of a deviated septum located on both sides of the nose, right worse than left. She has a deviated septum that causes a deviated nose / cosmetic deformity and moderate in severity. She has associated facial pain ( in the bilateral cheeks) and nasal. The deviated septum has been noticed for 1 week. She has had the following treatment(s): nothing. Patient reports that she had a fall on march 20th, where she was tripped by her dog and fell face first on tile flooring. Patient notes that she had some minor bleeding and major swelling. Now the nose is bent towards the right. The history was felt to be reliable. The patient provided translation services. 1. Vitals: Date Taken By B.P. Pulse Resp. O2 Sat. Temp. Ht. Wt. BMI BSA 03/17/24 15:35 Dyane Dustman, Cristopher 107/76 SIT 85 97.8 F 69.0 in* 160.0 lbs* 23.6 1.9 FiO2 * Patient Reported Exam: An Otolaryngologic exam was performed Otolaryngologic exam External Ears: external ear examination of normal size and morphology without traumatic or congenital deformity AD, external ear examination of normal size and morphology without traumatic or congenital deformity AS. External ear canal AD: normal EAC AD External ear canal AS: normal EAC AS Tympanic membrane AD: AD tympanic membrane intact, no fluid, normal mobility on pneumotoscopy Tympanic membrane AS: AS tympanic membrane intact, no fluid, normal mobility on pneumotoscopy Hearing: AD Hearing: normal gross reception to sound and clinical speech recognition, Weber does not lateralize (midline), air conduction greater than bone conduction on Rinne testing AS Hearing: normal gross reception to sound and clinical speech recognition, Weber does not lateralize (midline), air conduction greater than bone conduction on Rinne testing External Nose: crooked nose and dorsum  deviated to the right The remainder of the external nasal examination was normal with the exception of the above findings. Nasal cavity: Right nasal cavity: right intranasal examination normal without turbinate hypertrophy, masses, septal deformity or synechiae Left nasal cavity: septal deviation, convex, airway obstruction 75 - 100%; The remainder of the left nasal cavity was normal with the exception of the above findings. Visit Note - March 17, 2024 Kristen [name], Kristen [name] MRN: [id number] DOB: 14-Oct-1971 Sex: Female PMS ID: [id number] Vernie Murders, MD (Primary Provider) [facility] Under) Page 2 [phone] Work [phone] Fax [facility] [place] [address] [place], [place] [address] Family History Reviewed and no changes noted March 17, 2024. Family history of cancer (situation) - Grandmother - Grandfather Family history of mental disorder (situation) - Grandmother - Father Family history: Hypertension (situation) - Father - Grandfather Family history: Diabetes mellitus (situation) - Actor - Father Medical History Reviewed and no changes noted March 17, 2024. Bipolar disorder Other: Vascular disorder: pulmonary embolism, DVT Surgical History Reviewed and no changes noted March 17, 2024. Cholecystectomy Surgical procedure: arthroscopy right knee, hernia repair Other: kidney stones, bariatric surgery Lips, Teeth, Gums: normal lip morphology and anatomy, class I occlusion, no dental abnormalities  Oral cavity/Oropharynx: normal hard and soft palate, tongue, pharyngeal walls, buccal mucosa, floor of mouth, and tonsils Head Inspection: Normal head inspection with normal head shape, without masses or concerning lesions. Ocular Motility: orthophoric in primary gaze and normal ductions and versions OU.  Head Palpation: Normal head inspection without masses, palpable deformities, or concerning lesions. Salivary: Normal  inspection of salivary glands. Facial Strength: Right Facial Strength: I/VI: normal right face muscle tone Left Facial Strength: I/VI:  normal left face muscle tone Neck: normal neck examination without skin masses, tenderness or crepitus  Thyroid: normal thyroid examination without masses or nodules Respiratory Effort: normal respiratory effort without labored breathing or accessory muscle use  Auscultation of Lungs: normal right lung examination without wheezing, rales or rhonchi, normal left lung examination without wheezing, rales or rhonchi. Heart Auscultation: normal heart auscultation without murmur, rub or arrhythmia  Chest - clear to auscultation bilaterally C/V - regular rate and rhythm without murmur     Peripheral Vascular System: Normal right neck vascular exam without thrill, aneurysm or exposure, Normal left neck vascular exam without thrill, aneurysm or exposure  Neck Lymph Node: normal lymphatic exam without lymphadenopathy in cranial or cervical regions Neuro - Cranial Nerves: Cranial nerves II-XII intact. Appearance: well developed and nourished Communication: normal vocal quality and ability to communicate Orientation: Alert and oriented to person, place, time. Mood:mood and affect well-adjusted, pleasant and cooperative, appropriate for clinical and encounter circumstances Impression/Plan: Fracture of nasal bones, closed Fracture of nasal bones, initial encounter for closed fracture (S02.2XXA) Status: Inadequately Controlled Plan: Counseling - Nasal Fracture. 1. Visit Note - March 17, 2024 Kristen [name], Kristen [name] MRN: [id number] DOB: 1971-01-06 Sex: Female PMS ID: [id number] Vernie Murders, MD (Primary Provider) [facility] Under) Page 3 [phone] Work [phone] Fax [facility] [place] [address] [place], [place] [address] I counseled the patient regarding the following: Nasal Fracture Care: Nasal fractures may or may not  require surgery, depending on the extent of deformity and functional impairment. Nondisplaced fractures are often observed. Neurologic compromise and visible deformity may necessitate surgery. Topical eye care with drops and antibiotic ointments may be required for fractures of the orbit, forehead or upper cheek. Please follow your prescriptions. Numbness in certain areas like the cheek or forehead may be temporary, or may not resolve, depending on the extent of injury. Surgery may be delayed in some patients until swelling has partially subsided to permit better assessment of findings and technical ease of operation. Physical and occupational therapy may be required. Expectations: Nondisplaced and minimally displaced nasal fractures may heal spontaneously in many patients. Mild, or no, aesthetic deformity may result from untreated simple fractures. Severe injuries may result in aesthetic deformity or functional impairment that requires surgery. Specific indications for surgery may include persistent double vision, bleeding, persistent pain, deformity of the eye socket, and others. Surgery may be performed on an outpatient basis in selected patients, though many require 1 or more days of hospitalization. Healing may take 6-8 weeks and persistent aesthetic deformity and functional impairment may exist. Plan: Order for Surgery. Surgery scheduling order Surgeon: Okey Dupre Estimated Time: . Post-op follow up in: 7 days Facility Needed: [facility] Diagnosis codes: Fracture of nasal bones, closed S02.2XXA Target surgery date: 03/20/2024. Anesthesia: general. Risk level: high - High: unsure if can get back to perfectly straight again Provider: Vernie Murders, MD Priority: normal Plan: Treatment Regimen. Plan: She has a closed nasal fracture following a fall about 10 days ago. Her nasal bridge is displaced to the right side. Pictures taken. We need to get this repaired soon before the  bones become too solidified to move. We will add to Thursday schedule which should allow it to be repaired within the 14 day window. She will be worked in on Dr. Serita Kyle schedule to have this done on Thursday. She understands we cannot guarantee results, but will try to get back to as close to normal as possible.  She will have a cast for a week.. Follow up for: Post-operative visit Staff:  Anticipate to OR for Close Reduction of Nasal Fracture I discussed the risks, benefits and options for the proposed procedure with the family at length today - they understand and agree to proceed.   Vernie Murders, MD (Primary Provider) [facility] Under)

## 2024-03-20 ENCOUNTER — Ambulatory Visit: Payer: Self-pay | Admitting: Anesthesiology

## 2024-03-20 ENCOUNTER — Encounter: Payer: Self-pay | Admitting: Otolaryngology

## 2024-03-20 ENCOUNTER — Other Ambulatory Visit: Payer: Self-pay

## 2024-03-20 ENCOUNTER — Encounter
Admission: RE | Disposition: A | Discharge status: Home / Self Care | Payer: Self-pay | Source: Home / Self Care | Attending: Otolaryngology

## 2024-03-20 ENCOUNTER — Ambulatory Visit
Admission: RE | Admit: 2024-03-20 | Discharge: 2024-03-20 | Disposition: A | Discharge status: Home / Self Care | Attending: Otolaryngology | Admitting: Otolaryngology

## 2024-03-20 DIAGNOSIS — J342 Deviated nasal septum: Secondary | ICD-10-CM | POA: Insufficient documentation

## 2024-03-20 DIAGNOSIS — K219 Gastro-esophageal reflux disease without esophagitis: Secondary | ICD-10-CM | POA: Diagnosis not present

## 2024-03-20 DIAGNOSIS — S022XXA Fracture of nasal bones, initial encounter for closed fracture: Secondary | ICD-10-CM | POA: Diagnosis not present

## 2024-03-20 DIAGNOSIS — W010XXA Fall on same level from slipping, tripping and stumbling without subsequent striking against object, initial encounter: Secondary | ICD-10-CM | POA: Diagnosis not present

## 2024-03-20 SURGERY — CLOSED REDUCTION, FRACTURE, NASAL BONE
Anesthesia: General

## 2024-03-20 MED ORDER — PROPOFOL 10 MG/ML IV BOLUS
INTRAVENOUS | Status: AC
  Filled 2024-03-20: qty 20

## 2024-03-20 MED ORDER — SUCCINYLCHOLINE CHLORIDE 200 MG/10ML IV SOSY
PREFILLED_SYRINGE | INTRAVENOUS | Status: AC
  Filled 2024-03-20: qty 10

## 2024-03-20 MED ORDER — PHENYLEPHRINE HCL 0.5 % NA SOLN
NASAL | Status: DC | PRN
Start: 2024-03-20 — End: 2024-03-20
  Administered 2024-03-20: 2 [drp]

## 2024-03-20 MED ORDER — ONDANSETRON HCL 4 MG/2ML IJ SOLN
4.0000 mg | Freq: Once | INTRAMUSCULAR | Status: DC
Start: 2024-03-20 — End: 2024-03-20

## 2024-03-20 MED ORDER — OXYCODONE HCL 5 MG PO TABS
ORAL_TABLET | ORAL | Status: AC
  Filled 2024-03-20: qty 2

## 2024-03-20 MED ORDER — FENTANYL CITRATE (PF) 100 MCG/2ML IJ SOLN
INTRAMUSCULAR | Status: DC | PRN
  Administered 2024-03-20: 100 ug via INTRAVENOUS

## 2024-03-20 MED ORDER — PROPOFOL 10 MG/ML IV BOLUS
INTRAVENOUS | Status: DC | PRN
  Administered 2024-03-20: 200 mg via INTRAVENOUS
  Administered 2024-03-20: 100 mg via INTRAVENOUS
  Administered 2024-03-20: 50 mg via INTRAVENOUS

## 2024-03-20 MED ORDER — OXYCODONE HCL 5 MG PO TABS
10.0000 mg | ORAL_TABLET | Freq: Once | ORAL | Status: AC
  Administered 2024-03-20: 10 mg via ORAL

## 2024-03-20 MED ORDER — ONDANSETRON HCL 4 MG/2ML IJ SOLN
INTRAMUSCULAR | Status: AC
Start: 2024-03-20 — End: ?
  Filled 2024-03-20: qty 2

## 2024-03-20 MED ORDER — DEXAMETHASONE SODIUM PHOSPHATE 4 MG/ML IJ SOLN
INTRAMUSCULAR | Status: AC
  Filled 2024-03-20: qty 2

## 2024-03-20 MED ORDER — FENTANYL CITRATE (PF) 100 MCG/2ML IJ SOLN
INTRAMUSCULAR | Status: AC
  Filled 2024-03-20: qty 2

## 2024-03-20 MED ORDER — LIDOCAINE HCL (PF) 2 % IJ SOLN
INTRAMUSCULAR | Status: AC
  Filled 2024-03-20: qty 5

## 2024-03-20 MED ORDER — OXYCODONE HCL 5 MG PO TABS: 5.0000 mg | ORAL_TABLET | ORAL | 0 refills | Status: AC | PRN

## 2024-03-20 MED ORDER — ACETAMINOPHEN 10 MG/ML IV SOLN
INTRAVENOUS | Status: DC | PRN
  Administered 2024-03-20: 1000 mg via INTRAVENOUS

## 2024-03-20 MED ORDER — ONDANSETRON HCL 4 MG/2ML IJ SOLN
INTRAMUSCULAR | Status: DC | PRN
Start: 2024-03-20 — End: 2024-03-20
  Administered 2024-03-20: 4 mg via INTRAVENOUS

## 2024-03-20 MED ORDER — ONDANSETRON HCL 4 MG/2ML IJ SOLN
INTRAMUSCULAR | Status: AC
  Filled 2024-03-20: qty 2

## 2024-03-20 MED ORDER — MIDAZOLAM HCL 5 MG/5ML IJ SOLN
INTRAMUSCULAR | Status: DC | PRN
  Administered 2024-03-20: 2 mg via INTRAVENOUS

## 2024-03-20 MED ORDER — ACETAMINOPHEN 10 MG/ML IV SOLN
INTRAVENOUS | Status: AC
  Filled 2024-03-20: qty 100

## 2024-03-20 MED ORDER — MIDAZOLAM HCL 2 MG/2ML IJ SOLN
INTRAMUSCULAR | Status: AC
  Filled 2024-03-20: qty 2

## 2024-03-20 MED ORDER — SCOPOLAMINE 1 MG/3DAYS TD PT72
MEDICATED_PATCH | TRANSDERMAL | Status: AC
  Filled 2024-03-20: qty 1

## 2024-03-20 MED ORDER — SCOPOLAMINE 1 MG/3DAYS TD PT72
1.0000 | MEDICATED_PATCH | Freq: Once | TRANSDERMAL | Status: AC
  Administered 2024-03-20: 1 via TRANSDERMAL

## 2024-03-20 MED ORDER — DEXAMETHASONE SODIUM PHOSPHATE 4 MG/ML IJ SOLN
INTRAMUSCULAR | Status: DC | PRN
  Administered 2024-03-20: 10 mg via INTRAVENOUS

## 2024-03-20 SURGICAL SUPPLY — 13 items
BASIN GRAD PLASTIC 32OZ STRL (MISCELLANEOUS) ×1 IMPLANT
CANISTER SUCT 1200ML W/VALVE (MISCELLANEOUS) ×1 IMPLANT
COVER MAYO STAND STRL (DRAPES) ×1 IMPLANT
CUP MEDICINE 2OZ PLAST GRAD ST (MISCELLANEOUS) ×1 IMPLANT
GAUZE PETROLATUM 1 X8 (GAUZE/BANDAGES/DRESSINGS) IMPLANT
GAUZE SPONGE 4X4 12PLY STRL (GAUZE/BANDAGES/DRESSINGS) ×1 IMPLANT
GLOVE PI ULTRA LF STRL 7.5 (GLOVE) ×1 IMPLANT
PAD ALCOHOL SWAB (MISCELLANEOUS) ×1 IMPLANT
SPONGE NEURO XRAY DETECT 1X3 (DISPOSABLE) ×1 IMPLANT
STRAP BODY AND KNEE 60X3 (MISCELLANEOUS) ×1 IMPLANT
STRIP CLOSURE SKIN 1/2X4 (GAUZE/BANDAGES/DRESSINGS) ×1 IMPLANT
TOWEL OR 17X26 4PK STRL BLUE (TOWEL DISPOSABLE) ×1 IMPLANT
TUBING SUCTION CONN 0.25 STRL (TUBING) ×1 IMPLANT

## 2024-03-20 NOTE — Transfer of Care (Signed)
 Immediate Anesthesia Transfer of Care Note  Patient: Kristen [name]  Procedure(s) Performed: CLOSED REDUCTION, FRACTURE, NASAL BONE  Patient Location: PACU  Anesthesia Type: General  Level of Consciousness: awake, alert  and patient cooperative  Airway and Oxygen Therapy: Patient Spontanous Breathing and Patient connected to supplemental oxygen  Post-op Assessment: Post-op Vital signs reviewed, Patient's Cardiovascular Status Stable, Respiratory Function Stable, Patent Airway and No signs of Nausea or vomiting  Post-op Vital Signs: Reviewed and stable  Complications: No notable events documented.

## 2024-03-20 NOTE — Anesthesia Procedure Notes (Signed)
 Procedure Name: Intubation Date/Time: 03/20/2024 12:50 PM  Performed by: Domenic Moras, CRNAPre-anesthesia Checklist: Patient identified, Emergency Drugs available, Suction available and Patient being monitored Patient Re-evaluated:Patient Re-evaluated prior to induction Oxygen Delivery Method: Circle system utilized Preoxygenation: Pre-oxygenation with 100% oxygen Induction Type: IV induction Ventilation: Mask ventilation without difficulty Laryngoscope Size: McGrath and 3 Grade View: Grade I Tube type: Oral Rae Tube size: 7.0 mm Number of attempts: 1 Airway Equipment and Method: Oral airway Placement Confirmation: ETT inserted through vocal cords under direct vision, positive ETCO2 and breath sounds checked- equal and bilateral Secured at: 22 cm Tube secured with: Tape Dental Injury: Teeth and Oropharynx as per pre-operative assessment

## 2024-03-20 NOTE — Interval H&P Note (Signed)
 History and Physical Interval Note:  03/20/2024 12:02 PM  Kristen [name]  has presented today for surgery, with the diagnosis of CLOSED REDUCTION NASAL FRACTURE.  The various methods of treatment have been discussed with the patient and family. After consideration of risks, benefits and other options for treatment, the patient has consented to  Procedure(s): CLOSED REDUCTION, FRACTURE, NASAL BONE (N/A) as a surgical intervention.  The patient's history has been reviewed, patient examined, no change in status, stable for surgery.  I have reviewed the patient's chart and labs.  Questions were answered to the patient's satisfaction.     Reola Mosher S  No changes to H&P.   Magnus Ivan. Okey Dupre, MD, MBA, [facility] Otolaryngology-Head & Neck Surgery [facility] [phone]

## 2024-03-20 NOTE — Op Note (Signed)
 OPERATIVE REPORT      Attending Physician:  Magnus Ivan. Okey Dupre, MD, MBA, FARS                                     Rhinology, Allergy & Sinus Surgery   Preoperative Diagnosis:  Closed nasal fracture Postoperative Diagnosis: Same   Procedure(s) Performed:    Closed reduction of nasal fracture, CPT 21320   Teaching Surgeon: Magnus Ivan. Okey Dupre, MD, MBA, FARS Assistants: None Dictated    Anesthesia:  General with cuffed regular ETT tube Specimens:  None Drains:  None Estimated Blood Loss: Less than 10 mL   Operative Findings:  Nasal dorsum deviation to the right due to traumatic, comminuted nasal bone fracture   Procedure: After informed consent was obtained, the patient was brought from the preoperative holding area to the operating room and placed supine on the operating room table.  After smooth induction of general anesthesia with a cuffed ET tube, a timeout was called and all parties were in agreement.   Bilateral nasal pledgets soaked in oxymetazoline were placed.  After 2-3 minutes, these were removed an a sayer elevator and Ash forceps used to lift and reduce the nasal dorsum, deviated to the right side, back toe midline.  Folling adequate reduction, steri-strips and an aqua-plast cast were applied to the nasal dorsum for stabilization.  Adequate hemostasis was assured.  A drips pad was also applied with a 4x4 gauze and tape.     The stomach was then suctioned, and the patient turned 90 degrees back towards anesthesia.    The patient's care was turned over to the Anesthesia team who successfully awakened the patient without event. The patient was transported to the PACU in stable condition. All instrument, sharp and lap counts were correct at the end of the case.    Teaching Surgeon Attestation:  I was present and performed the entire procedure.     Magnus Ivan. Okey Dupre, MD, MBA, FARS Otolaryngology-Head & Neck Surgery [facility]

## 2024-03-20 NOTE — Anesthesia Preprocedure Evaluation (Addendum)
 Anesthesia Evaluation  Patient identified by MRN, date of birth, ID band Patient awake    Reviewed: Allergy & Precautions, H&P , NPO status , Patient's Chart, lab work & pertinent test results  History of Anesthesia Complications (+) PONV and history of anesthetic complications  Airway Mallampati: I  TM Distance: >3 FB Neck ROM: Full    Dental no notable dental hx.    Pulmonary neg pulmonary ROS   Pulmonary exam normal breath sounds clear to auscultation       Cardiovascular negative cardio ROS Normal cardiovascular exam Rhythm:Regular Rate:Normal     Neuro/Psych  PSYCHIATRIC DISORDERS Anxiety  Bipolar Disorder   negative neurological ROS  negative psych ROS   GI/Hepatic negative GI ROS, Neg liver ROS,GERD  ,,  Endo/Other  negative endocrine ROS    Renal/GU Renal diseasenegative Renal ROS  negative genitourinary   Musculoskeletal negative musculoskeletal ROS (+)    Abdominal   Peds negative pediatric ROS (+)  Hematology negative hematology ROS (+) Blood dyscrasia, anemia   Anesthesia Other Findings  Pulmonary embolism (HCC) RLS (restless legs syndrome) Bipolar disorder (HCC) DVT (deep venous thrombosis)  Anxiety  GERD (gastroesophageal reflux disease) Cholelithiasis  Kidney stones Anemia  PONV (postoperative nausea and vomiting)    Reproductive/Obstetrics negative OB ROS                             Anesthesia Physical Anesthesia Plan  ASA: 3  Anesthesia Plan: General   Post-op Pain Management:    Induction: Intravenous  PONV Risk Score and Plan:   Airway Management Planned: Oral ETT  Additional Equipment:   Intra-op Plan:   Post-operative Plan: Extubation in OR  Informed Consent: I have reviewed the patients History and Physical, chart, labs and discussed the procedure including the risks, benefits and alternatives for the proposed anesthesia with the patient or  authorized representative who has indicated his/her understanding and acceptance.     Dental Advisory Given  Plan Discussed with: Anesthesiologist, CRNA and Surgeon  Anesthesia Plan Comments: (Patient consented for risks of anesthesia including but not limited to:  - adverse reactions to medications - damage to eyes, teeth, lips or other oral mucosa - nerve damage due to positioning  - sore throat or hoarseness - Damage to heart, brain, nerves, lungs, other parts of body or loss of life  Patient voiced understanding and assent.)       Anesthesia Quick Evaluation

## 2024-03-20 NOTE — Anesthesia Postprocedure Evaluation (Signed)
 Anesthesia Post Note  Patient: Kristen [name]  Procedure(s) Performed: CLOSED REDUCTION, FRACTURE, NASAL BONE  Patient location during evaluation: PACU Anesthesia Type: General Level of consciousness: awake and alert Pain management: pain level controlled Vital Signs Assessment: post-procedure vital signs reviewed and stable Respiratory status: spontaneous breathing, nonlabored ventilation, respiratory function stable and patient connected to nasal cannula oxygen Cardiovascular status: blood pressure returned to baseline and stable Postop Assessment: no apparent nausea or vomiting Anesthetic complications: no   No notable events documented.   Last Vitals:  Vitals:   03/20/24 1324 03/20/24 1330  BP: 121/77 126/85  Pulse: 73 72  Resp: 14 14  Temp: (!) 36.2 C   SpO2: 100% 100%    Last Pain:  Vitals:   03/20/24 1334  TempSrc:   PainSc: 8                  Ryleeann Urquiza C Syriana Croslin

## 2024-03-26 DIAGNOSIS — J3489 Other specified disorders of nose and nasal sinuses: Secondary | ICD-10-CM | POA: Diagnosis not present

## 2024-04-14 ENCOUNTER — Ambulatory Visit (INDEPENDENT_AMBULATORY_CARE_PROVIDER_SITE_OTHER): Admitting: Professional Counselor

## 2024-05-13 ENCOUNTER — Other Ambulatory Visit: Payer: Self-pay | Admitting: Psychiatry

## 2024-05-13 DIAGNOSIS — F319 Bipolar disorder, unspecified: Secondary | ICD-10-CM

## 2024-05-13 DIAGNOSIS — F515 Nightmare disorder: Secondary | ICD-10-CM

## 2024-05-15 ENCOUNTER — Ambulatory Visit (INDEPENDENT_AMBULATORY_CARE_PROVIDER_SITE_OTHER): Payer: Self-pay | Admitting: Professional Counselor

## 2024-05-15 DIAGNOSIS — Z0389 Encounter for observation for other suspected diseases and conditions ruled out: Secondary | ICD-10-CM

## 2024-05-15 NOTE — Progress Notes (Signed)
 Pt did not show for scheduled appointment. No message.  Bartolo Darter, [facility]

## 2024-05-19 ENCOUNTER — Ambulatory Visit: Admitting: Psychiatry

## 2024-06-17 ENCOUNTER — Other Ambulatory Visit: Payer: Self-pay | Admitting: Psychiatry

## 2024-06-17 DIAGNOSIS — F319 Bipolar disorder, unspecified: Secondary | ICD-10-CM

## 2024-06-17 DIAGNOSIS — F515 Nightmare disorder: Secondary | ICD-10-CM

## 2024-07-16 ENCOUNTER — Encounter: Payer: Self-pay | Admitting: Psychiatry

## 2024-07-16 ENCOUNTER — Ambulatory Visit (INDEPENDENT_AMBULATORY_CARE_PROVIDER_SITE_OTHER): Admitting: Psychiatry

## 2024-07-16 DIAGNOSIS — G2581 Restless legs syndrome: Secondary | ICD-10-CM | POA: Diagnosis not present

## 2024-07-16 DIAGNOSIS — F515 Nightmare disorder: Secondary | ICD-10-CM

## 2024-07-16 DIAGNOSIS — F422 Mixed obsessional thoughts and acts: Secondary | ICD-10-CM | POA: Diagnosis not present

## 2024-07-16 DIAGNOSIS — F4001 Agoraphobia with panic disorder: Secondary | ICD-10-CM

## 2024-07-16 DIAGNOSIS — E611 Iron deficiency: Secondary | ICD-10-CM | POA: Diagnosis not present

## 2024-07-16 DIAGNOSIS — F319 Bipolar disorder, unspecified: Secondary | ICD-10-CM

## 2024-07-16 DIAGNOSIS — E538 Deficiency of other specified B group vitamins: Secondary | ICD-10-CM

## 2024-07-16 DIAGNOSIS — F5105 Insomnia due to other mental disorder: Secondary | ICD-10-CM | POA: Diagnosis not present

## 2024-07-16 DIAGNOSIS — G4752 REM sleep behavior disorder: Secondary | ICD-10-CM

## 2024-07-16 DIAGNOSIS — G43009 Migraine without aura, not intractable, without status migrainosus: Secondary | ICD-10-CM

## 2024-07-16 DIAGNOSIS — F411 Generalized anxiety disorder: Secondary | ICD-10-CM

## 2024-07-16 DIAGNOSIS — R7989 Other specified abnormal findings of blood chemistry: Secondary | ICD-10-CM | POA: Diagnosis not present

## 2024-07-16 MED ORDER — BUPROPION HCL ER (XL) 150 MG PO TB24: 150.0000 mg | ORAL_TABLET | Freq: Every day | ORAL | 1 refills | Status: AC

## 2024-07-16 MED ORDER — LURASIDONE HCL 80 MG PO TABS: 80.0000 mg | ORAL_TABLET | Freq: Every day | ORAL | 1 refills | Status: DC

## 2024-07-16 MED ORDER — CLONAZEPAM 0.5 MG PO TABS
ORAL_TABLET | ORAL | 1 refills | Status: AC
Start: 2024-07-16 — End: ?

## 2024-07-16 MED ORDER — FLUVOXAMINE MALEATE 100 MG PO TABS: 300.0000 mg | ORAL_TABLET | Freq: Every day | ORAL | 1 refills | Status: DC

## 2024-07-16 MED ORDER — LAMOTRIGINE 200 MG PO TABS: 200.0000 mg | ORAL_TABLET | Freq: Every day | ORAL | 1 refills | Status: DC

## 2024-07-16 MED ORDER — TOPIRAMATE 50 MG PO TABS: ORAL_TABLET | ORAL | 1 refills | Status: DC

## 2024-07-16 MED ORDER — OXCARBAZEPINE 300 MG PO TABS: ORAL_TABLET | ORAL | 1 refills | Status: DC

## 2024-07-16 MED ORDER — DOXAZOSIN MESYLATE 4 MG PO TABS: 4.0000 mg | ORAL_TABLET | Freq: Every day | ORAL | 1 refills | Status: AC

## 2024-07-16 NOTE — Progress Notes (Signed)
 Kristen [name] [id number] [birth year] 53 y.o.     Subjective:   Patient ID:  Kristen [name] is a 53 y.o. (DOB 1971/07/12) female.  Chief Complaint:  Chief Complaint  Patient presents with   Follow-up   Depression   Anxiety   Sleeping Problem    HPI Kristen [name] presents to the office today for follow-up of bipolar disorder and anxiety   07/16/20 appt with the following noted: Thinks topiramate  makes her labile and impulsive in speech.  Not me at all.  Didn't start until taking that.  Taking it for migraine and tinnitus.  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  Balance issues. No tx options. Otherwise mood is stable. Staying up late.  Want to stay up all night. Mood lability and irritability and cycling into being too brave. SX started when increased topiramate  to current dose. OCD is a lot better. She thinks Wellbutrin  helps depression.  Pleased she's not depressed. Plan: Continue Trileptal  to 900 mg dayly.     fluvaxamine 300 mg nightly. Continue Wellbutrin  XL 150 every morning Reduce topiramate  to 50 mg AM and 100 PM bc she felt it contributed to manic sx  09/29/2020 appointment with the following noted: Better with reduced topiramate  with resolution of HA and manic subsided quite a bit. Not depressed.  Mood evened out. No SE meds. Needs refill fo Xanax  and takes it nightly.  Mistake and received 0.25 instead of 0.5 mg tablets.  Not needed much daytime for anxiety. Patient reports stable mood and denies depressed or irritable moods.  Patient denies any recent difficulty with anxiety.  Patient denies difficulty with sleep initiation or maintenance. With meds 6-7 hours of sleep.  Denies appetite disturbance.  Patient reports that energy and motivation have been good.  Patient denies any difficulty with concentration.  Patient denies any suicidal ideation. Plan: No med changes  02/28/2021 appointment with the following noted: OK overall.  Some other  issues from fall and consussion she had.  Including hearing affected.  Hearing aid.  Memory affected also by Covid per neuro.  Missed work for [employer].  Neuro took her out of work for a couple of months. Mood has been great and stable. Plan: No med changes  08/31/2021 appointment with the following noted: Hearing loss from concussion about 30% and has tinnitus.   Pretty good from mental health perspective.  No mood swings.  Not depressed.  Minimal OCD and anxiety.  No SE with meds. Sleep is weird, sort of sleep walk.  May or may not recognize it.  Talks in sleep.  May yell at [place].  Touches things beside her bed.  Happens 2-3 times per week. Has neuro but not seen since March. History of low B12 and was on shots. Hx G bypass 2010 Infrequent Xanax .  08/31/21 TC to pt: Her vitamin D  level is much too low and has been historically too low.  That can contribute to fatigue and depression and cognitive problems.  I sent in a prescription for once a week vitamin D  and high dose. Her B12 levels have historically been low as well and are currently borderline.  I sent in a prescription for vitamin B12 as well and that may help her energy, concentration, and mood. Please call and give her this information.  12/08/2021 appointment noted: Started B12 and vitamin D .   Overall doing ok.  A little bit of benefit noted with vitamins.  Severe osteoporsis in family. No SE with meds.  Patient reports stable mood and denies depressed or irritable moods.  Patient mild recent difficulty with anxiety.  Patient denies difficulty with sleep initiation or maintenance. Denies appetite disturbance.  Patient reports that energy and motivation have been good.  Patient denies any difficulty with concentration.  Patient denies any suicidal ideation. Bad NM once every 2-3 weeks and less than it was. 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 Plan: No med changes  03/14/2022 appointment with the following noted: Still taking Wellbutrin  XL 150  mg every morning, clonazepam  0.5 mg nightly as needed insomnia, fluvoxamine  300, lamotrigine  200, oxcarbazepine  300 every morning and 600 nightly, propranolol  20 mg 3 times daily as needed restlessness, topiramate  50 every morning and 100 nightly and vitamin B12 Doing ok overall.  Iron  really low. Causing RLS NM and talking in her sleep bothers H. Awaken her twice weekly.  Occ sleep walking. Clonazepam  can give a hangover.  12/28/22 appt noted: Still battles low iron  and had 6-8 iron  infusions.  And didn't make a big difference at the time.   And expensive and drove down BP. Hard time sleeping and doesn't know why and has NM  when sleeps.  Has gone 2-3 days wituot sleep.   OCD is out of control with death obsessions triggered by mother's carotid artery stent placement and out of anesthesia was agitated.  Scared her to death bc fear she would die.  She is somewhat recovered.    Family won't tell her things bc I get so upset.    Current meds. Still taking Wellbutrin  XL 150 mg every morning, clonazepam  0.5 mg nightly as needed insomnia, fluvoxamine  300, lamotrigine  200, oxcarbazepine  300 every morning and 600 nightly, propranolol  20 mg 3 times daily as needed restlessness, topiramate  50 every morning and 100 nightly and vitamin B12.  No doxazosin . She had sinus surgery better. Father in law with dementia and mother in law talking about death.   Even with clonazepam  1.0 mg HS can't sleep.  Don't want to go anywhere.  Sometimes feels hyped up briefly.  Just wants to sit.  Hard to concentrate.  Hard to read.   Checks doors , stove, locks, powerstrips, daughter's room.  Worse. Plan: Never tried doxazosin  for NM and sleep talking  2-4 mg HS If this doesnot work then will increase clonazepam  Disc dx RBSD.  TX of choice is clonazepam  0.5-1.0 mg HS Restart Latuda  80 mg daily.  02/28/23 appt noted: Still taking Wellbutrin  XL 150 mg every morning, clonazepam  0.5 mg nightly as needed insomnia, fluvoxamine  300,  lamotrigine  200, oxcarbazepine  300 every morning and 600 nightly, propranolol  20 mg 3 times daily as needed restlessness, topiramate  50 every morning and 100 nightly and vitamin B12.  Doxazosin  4 mg HS helped. Restarted Latuda  80 mg daily. Sleep is better.   Only issue is iron , D, C are low and has new PCP and will be taking supplements.  Doesn't like iron  infusions and they are expensive and only work for a couple of mos.   Muscle and bone pain from deficiencies.   Mood has been good and much better with Latuda .  Sleep got better with it too.  No NM now.   Now clonazepam  only prn sleep and not daily. Plan no changes   07/31/23 appt noted: No med changes.  No SE.  No clonazepam  needed. Doing well with meds.  Patient reports stable mood and denies depressed or irritable moods.  Patient denies any recent difficulty with anxiety.  Patient denies difficulty with sleep initiation or  maintenance. Denies appetite disturbance.  Patient reports that energy and motivation have been good.  Patient denies any difficulty with concentration.  Patient denies any suicidal ideation.  Sleep better helped energy.   No mood swings.   Taking supplements as above. Health is good.  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  07/16/24 appt noted:  Med; Wellbutrin  XL 150 mg every morning, clonazepam  0.5 mg nightly as needed insomnia, fluvoxamine  300, lamotrigine  200, oxcarbazepine  300 every morning and 600 nightly, propranolol  20 mg 3 times daily as needed restlessness, topiramate  50 every morning and 100 nightly, and vitamin B12.  Doxazosin  4 mg HS helped.  Latuda  80 mg daily. Doing ok overall.  A lot of changes.  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  Accepted it and 2 weeks later H fired from his job through no fault of his own.  Fired without cause.  He was bread winner.  She has health insurance and 401K.   Gets bonuses monthly.  Good pay now.  H Scott still unemployed.   Youngest son 23 yo now decided to go back to  school and just moved to [place] for [place] year.  Hard with empty nest.  Enjoys new job, been better.  No SE Sleep good.  No NM.  No actin out dreams.  Not dep.  No mood swings. Not disruptive OCD except some in driving but not dangerous.  Some HA but not much overall.     Past Psychiatric Medication Trials: Vraylar 3 restless, risperidone nonresponse, Seroquel weight gain, Abilify 10,  Latuda  80 good response,  oxcarbazepine  900 SE,    lamotrigine  300,  lithium,  topiramate   100 mg BID hypomania.  pramipexole ,  duloxetine, Lexapro, Wellbutrin , fluoxetine, venlafaxine, sertraline,  fluvoxamine  300 clonazepam , alprazolam ,  buspar NR Doxazosin  4   Review of Systems:  Review of Systems  Constitutional:  Negative for fatigue.  HENT:  Positive for hearing loss and tinnitus.   Neurological:  Positive for headaches. Negative for tremors and weakness.       RLS returned  Psychiatric/Behavioral:  Negative for confusion, decreased concentration and dysphoric mood. The patient is not nervous/anxious.     Medications: I have reviewed the patient's current medications.  Current Outpatient Medications  Medication Sig Dispense Refill   fluticasone  (FLONASE ) 50 MCG/ACT nasal spray PLACE 2 SPRAYS INTO BOTH NOSTRILS DAILY. 16 g 0   rivaroxaban  (XARELTO ) 10 MG TABS tablet Take 1 tablet (10 mg total) by mouth daily. 90 tablet 3   buPROPion  (WELLBUTRIN  XL) 150 MG 24 hr tablet Take 1 tablet (150 mg total) by mouth daily. 90 tablet 1   clonazePAM  (KLONOPIN ) 0.5 MG tablet TAKE ONE TO TWO TABLETS BY MOUTH AT BEDTIME AS NEEDED FOR SLEEP DISORDER 60 tablet 1   doxazosin  (CARDURA ) 4 MG tablet Take 1 tablet (4 mg total) by mouth at bedtime. 90 tablet 1   fluvoxaMINE  (LUVOX ) 100 MG tablet Take 3 tablets (300 mg total) by mouth at bedtime. 270 tablet 1   lamoTRIgine  (LAMICTAL ) 200 MG tablet Take 1 tablet (200 mg total) by mouth daily. 90 tablet 1   lurasidone  (LATUDA ) 80 MG TABS tablet Take 1 tablet (80 mg  total) by mouth daily at 12 noon. 90 tablet 1   Oxcarbazepine  (TRILEPTAL ) 300 MG tablet TAKE ONE TABLET BY MOUTH EVERY MORNING AND TAKE TWO TABLETS BY MOUTH AT BEDTIME 270 tablet 1   oxyCODONE  (ROXICODONE ) 5 MG immediate release tablet Take 1 tablet (5 mg total) by  mouth every 4 (four) hours as needed for up to 15 doses for severe pain (pain score 7-10). 15 tablet 0   topiramate  (TOPAMAX ) 50 MG tablet TAKE ONE TABLET (50 MG) BY MOUTH EVERY MORNING AND TAKE TWO TABLETS (100 MG) BYMOUTH EVERY EVENING 270 tablet 1   No current facility-administered medications for this visit.    Medication Side Effects: None  Allergies:  Allergies  Allergen Reactions   Codeine Nausea Only    Can tolerate medication when taken with food    Past Medical History:  Diagnosis Date   Anemia    Anxiety    Bipolar disorder (HCC)    Cholelithiasis    DVT (deep venous thrombosis) (HCC)    GERD (gastroesophageal reflux disease)    Kidney stones    PONV (postoperative nausea and vomiting)    Pulmonary embolism (HCC)    RLS (restless legs syndrome)     Family History  Problem Relation Age of Onset   Diabetes Father    Stroke Father    Heart attack Father    Kidney failure Father    Hypertension Father    Uterine cancer Maternal Grandmother    Ulcers Maternal Grandmother    Diabetes Maternal Grandfather    Stroke Maternal Grandfather    Breast cancer Paternal Grandmother    Colon cancer Paternal Grandfather    Hypertension Paternal Grandfather     Social History   Socioeconomic History   Marital status: Married    Spouse name: Not on file   Number of children: 3   Years of education: Not on file   Highest education level: Not on file  Occupational History   Occupation: Architectural technologist    Employer: [employer]  Tobacco Use   Smoking status: Never   Smokeless tobacco: Never  Vaping Use   Vaping status: Never Used  Substance and Sexual Activity   Alcohol use: No     Alcohol/week: 0.0 standard drinks of alcohol   Drug use: No   Sexual activity: Yes    Partners: Male    Birth control/protection: I.U.D.    Comment: mirena   Other Topics Concern   Not on file  Social History Narrative   Not on file   Social Drivers of Health   Financial Resource Strain: Not on file  Food Insecurity: Not on file  Transportation Needs: Not on file  Physical Activity: Not on file  Stress: Not on file  Social Connections: Not on file  Intimate Partner Violence: Not on file    Past Medical History, Surgical history, Social history, and Family history were reviewed and updated as appropriate.   Please see review of systems for further details on the patient's review from today.   Objective:   Physical Exam:  LMP 09/01/2017   Physical Exam Constitutional:      General: She is not in acute distress. Musculoskeletal:        General: No deformity.  Neurological:     Mental Status: She is alert and oriented to person, place, and time.     Cranial Nerves: No dysarthria.     Coordination: Coordination normal.  Psychiatric:        Attention and Perception: Attention and perception normal. She does not perceive auditory or visual hallucinations.        Mood and Affect: Mood normal. Mood is not anxious or depressed. Affect is not labile or angry.        Speech: Speech normal.  Behavior: Behavior normal. Behavior is cooperative.        Thought Content: Thought content normal. Thought content is not paranoid or delusional. Thought content does not include homicidal or suicidal ideation. Thought content does not include suicidal plan.        Cognition and Memory: Cognition is not impaired.        Judgment: Judgment normal.     Comments: Insight intact Minimal anxiety      Lab Review:     Component Value Date/Time   NA 144 07/25/2023 1113   NA 140 08/15/2012 1624   K 4.1 07/25/2023 1113   K 3.6 08/15/2012 1624   CL 111 (H) 07/25/2023 1113   CL 106  08/15/2012 1624   CO2 20 07/25/2023 1113   CO2 26 08/15/2012 1624   GLUCOSE 97 07/25/2023 1113   GLUCOSE 153 (H) 03/05/2017 2142   GLUCOSE 83 08/15/2012 1624   BUN 11 07/25/2023 1113   BUN 10 08/15/2012 1624   CREATININE 0.78 07/25/2023 1113   CREATININE 0.55 (L) 08/15/2012 1624   CALCIUM 8.9 07/25/2023 1113   CALCIUM 9.0 08/15/2012 1624   PROT 5.7 (L) 07/25/2023 1113   PROT 6.5 08/15/2012 1624   ALBUMIN 3.8 07/25/2023 1113   ALBUMIN 3.4 08/15/2012 1624   AST 21 07/25/2023 1113   AST 22 08/15/2012 1624   ALT 18 07/25/2023 1113   ALT 23 08/15/2012 1624   ALKPHOS 96 07/25/2023 1113   ALKPHOS 98 08/15/2012 1624   BILITOT 0.3 07/25/2023 1113   BILITOT 0.8 08/15/2012 1624   GFRNONAA 112 07/01/2018 1103   GFRNONAA >60 08/15/2012 1624   GFRAA 129 07/01/2018 1103   GFRAA >60 08/15/2012 1624       Component Value Date/Time   WBC 4.9 07/25/2023 1113   WBC 5.8 06/06/2022 1515   RBC 4.71 07/25/2023 1113   RBC 4.07 06/06/2022 1515   HGB 14.1 07/25/2023 1113   HCT 42.9 07/25/2023 1113   PLT 334 07/25/2023 1113   MCV 91 07/25/2023 1113   MCV 91 08/15/2012 1624   MCH 29.9 07/25/2023 1113   MCH 23.8 (L) 06/06/2022 1515   MCHC 32.9 07/25/2023 1113   MCHC 29.8 (L) 06/06/2022 1515   RDW 13.9 07/25/2023 1113   RDW 15.7 (H) 08/15/2012 1624   LYMPHSABS 1.2 06/06/2022 1515   LYMPHSABS 1.5 10/08/2015 1107   LYMPHSABS 1.9 08/15/2012 1624   MONOABS 0.6 06/06/2022 1515   MONOABS 0.8 08/15/2012 1624   EOSABS 0.6 (H) 06/06/2022 1515   EOSABS 0.1 10/08/2015 1107   EOSABS 0.2 08/15/2012 1624   BASOSABS 0.0 06/06/2022 1515   BASOSABS 0.0 10/08/2015 1107   BASOSABS 0.0 08/15/2012 1624    No results found for: POCLITH, LITHIUM   No results found for: PHENYTOIN, PHENOBARB, VALPROATE, CBMZ   11/01/2021 vitamin D  16, B12 295  02/09/22 Ferritin low 8  .res Assessment: Plan:    Kearstin was seen today for follow-up, depression, anxiety and sleeping problem.  Diagnoses and  all orders for this visit:  Bipolar I disorder (HCC) -     Oxcarbazepine  (TRILEPTAL ) 300 MG tablet; TAKE ONE TABLET BY MOUTH EVERY MORNING AND TAKE TWO TABLETS BY MOUTH AT BEDTIME -     lurasidone  (LATUDA ) 80 MG TABS tablet; Take 1 tablet (80 mg total) by mouth daily at 12 noon. -     lamoTRIgine  (LAMICTAL ) 200 MG tablet; Take 1 tablet (200 mg total) by mouth daily. -     buPROPion  (WELLBUTRIN  XL) 150  MG 24 hr tablet; Take 1 tablet (150 mg total) by mouth daily.  Generalized anxiety disorder  Mixed obsessional thoughts and acts -     lurasidone  (LATUDA ) 80 MG TABS tablet; Take 1 tablet (80 mg total) by mouth daily at 12 noon. -     fluvoxaMINE  (LUVOX ) 100 MG tablet; Take 3 tablets (300 mg total) by mouth at bedtime.  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   30-minute face to face time with patient  We discussed Good response and stability with psych meds when on Latuda .  However had to stop Latuda  DT cost.  This was very unfortunate.  Historically she has relapse with symptoms whenever she has stopped Latuda .  She did ok without it for awhile then had relapse of sx including: after stopping the Latuda  she had no marked depression or mania but had unexpected recurrence of obsessive-compulsive symptoms.  She had these types of symptoms as a teenager but has not had severe symptoms as an  adult and recent years.  The increase in Trileptal  to 900 mg daily did not help the OCD.   Latuda  80 mg and sx resolved again.  History of gastric bypass and secondary history low B12 and iron  She is taking oral B12  And added vitamin D    Continue Trileptal  to 900 mg daily.    Informed her that we may have to go much higher and to particularly let us  know if this insomnia persist.  Disc the off-label use of N-Acetylcysteine at 600 mg daily to help with mild cognitive problems.  It can be combined with a B-complex vitamin as the B-12 and folate have been shown to sometimes enhance the effect.   Discussed side effects of each med in detail.    fluvoxamine  300 mg nightly.  OCD betterl controlled right now. Disc DDI with Xanax  and use lower dosage.    Continue topiramate  50 mg in the morning and 100 mg in the evening.  The reduction resolved hypomania.  This is controlling headache.  Option reduce.   Continue Wellbutrin  XL 150 mg every morning because it helped with depression and she is tolerating it well.  tried doxazosin  for NM and sleep talking  2-4 mg HS and it worked. Disc dx RBSD.    We discussed the short-term risks associated with benzodiazepines including sedation and increased fall risk among others.  Discussed long-term side effect risk including dependence, potential withdrawal symptoms, and the potential eventual dose-related risk of dementia.  But recent studies from 2020 dispute this association between benzodiazepines and dementia risk. Newer studies in 2020 do not support an association with dementia. No clonazepam  needed since here  No med changes  Follow-up 6 mos  Lorene Macintosh MD, DFAPA  Please see After Visit Summary for patient specific instructions.  No future appointments.  No orders of the defined types were placed in this encounter.    -------------------------------

## 2024-10-01 ENCOUNTER — Encounter: Payer: Self-pay | Admitting: Family

## 2024-10-01 ENCOUNTER — Other Ambulatory Visit: Payer: Self-pay | Admitting: Family

## 2024-10-01 DIAGNOSIS — R7989 Other specified abnormal findings of blood chemistry: Secondary | ICD-10-CM

## 2024-10-06 ENCOUNTER — Emergency Department
Admission: EM | Admit: 2024-10-06 | Discharge: 2024-10-06 | Disposition: A | Discharge status: Home / Self Care | Source: Ambulatory Visit | Attending: Emergency Medicine | Admitting: Emergency Medicine

## 2024-10-06 ENCOUNTER — Emergency Department

## 2024-10-06 ENCOUNTER — Other Ambulatory Visit: Payer: Self-pay

## 2024-10-06 DIAGNOSIS — R059 Cough, unspecified: Secondary | ICD-10-CM | POA: Diagnosis not present

## 2024-10-06 DIAGNOSIS — R0789 Other chest pain: Secondary | ICD-10-CM | POA: Diagnosis not present

## 2024-10-06 DIAGNOSIS — Z7901 Long term (current) use of anticoagulants: Secondary | ICD-10-CM | POA: Diagnosis not present

## 2024-10-06 DIAGNOSIS — R0602 Shortness of breath: Secondary | ICD-10-CM

## 2024-10-06 DIAGNOSIS — R079 Chest pain, unspecified: Secondary | ICD-10-CM | POA: Diagnosis not present

## 2024-10-06 DIAGNOSIS — J069 Acute upper respiratory infection, unspecified: Secondary | ICD-10-CM | POA: Insufficient documentation

## 2024-10-06 DIAGNOSIS — I517 Cardiomegaly: Secondary | ICD-10-CM | POA: Diagnosis not present

## 2024-10-06 LAB — CBC
HCT: 42.9 % (ref 36.0–46.0)
Hemoglobin: 13 g/dL (ref 12.0–15.0)
MCH: 27.8 pg (ref 26.0–34.0)
MCHC: 30.3 g/dL (ref 30.0–36.0)
MCV: 91.7 fL (ref 80.0–100.0)
Platelets: 367 K/uL (ref 150–400)
RBC: 4.68 MIL/uL (ref 3.87–5.11)
RDW: 15 % (ref 11.5–15.5)
WBC: 9.4 K/uL (ref 4.0–10.5)
nRBC: 0 % (ref 0.0–0.2)

## 2024-10-06 LAB — TROPONIN I (HIGH SENSITIVITY)
Troponin I (High Sensitivity): 3 ng/L (ref <18)
Troponin I (High Sensitivity): 4 ng/L (ref <18)

## 2024-10-06 LAB — BASIC METABOLIC PANEL WITH GFR
Anion gap: 12 (ref 5–15)
BUN: 9 mg/dL (ref 6–20)
CO2: 21 mmol/L — ABNORMAL LOW (ref 22–32)
Calcium: 8.8 mg/dL — ABNORMAL LOW (ref 8.9–10.3)
Chloride: 109 mmol/L (ref 98–111)
Creatinine, Ser: 0.71 mg/dL (ref 0.44–1.00)
GFR, Estimated: >60 mL/min (ref >60)
Glucose, Bld: 87 mg/dL (ref 70–99)
Potassium: 3.7 mmol/L (ref 3.5–5.1)
Sodium: 142 mmol/L (ref 135–145)

## 2024-10-06 MED ORDER — FLUTICASONE PROPIONATE 50 MCG/ACT NA SUSP: 1.0000 | Freq: Every day | NASAL | 2 refills | Status: AC

## 2024-10-06 MED ORDER — IOHEXOL 350 MG/ML SOLN
75.0000 mL | Freq: Once | INTRAVENOUS | Status: AC | PRN
  Administered 2024-10-06: 75 mL via INTRAVENOUS

## 2024-10-06 MED ORDER — METHYLPREDNISOLONE 4 MG PO TBPK: ORAL_TABLET | ORAL | 0 refills | Status: AC

## 2024-10-06 NOTE — ED Triage Notes (Signed)
 First Nurse Note: Patient to ED from [facility] for chest pressure. Also having congestion and productive green cough. Completed Covid/flu/rsv test but awaiting the results. Hx PE

## 2024-10-06 NOTE — ED Triage Notes (Addendum)
 Pt to ED via POV from [facility]. Pt reports cough, chest congestion and chest pressure for the last few days. KC concerned due to pt's hx of blood clots in lungs and legs. Pt is on blood thinners. Some SOB.   Blue top sent if needed

## 2024-10-06 NOTE — ED Provider Notes (Signed)
-----------------------------------------   3:20 PM on 10/06/2024 -----------------------------------------  Blood pressure 105/78, pulse 77, temperature 98.3 F (36.8 C), temperature source Oral, resp. rate 19, last menstrual period 09/01/2017, SpO2 100%.  Assuming care from Devere Perry, PA-C.  In short, Kristen [name] is a 53 y.o. female with a chief complaint of Chest Pain and Cough .  Refer to the original H&P for additional details.  The current plan of care is to follow up on CT and treat as indicated.    Clinical Course as of 10/06/24 1732  Mon Oct 06, 2024  1400 ED EKG [MM]  1731 CTA negative for PE. Will treat with medrol dose pack and have her follow up with primary care in about a week. ER return precautions given. Discharged in stable condition. [CT]    Clinical Course User Index [CT] Herlinda Kirk NOVAK, FNP [MM] Darrelyn Ozell Jann Herlinda Kirk NOVAK, FNP 10/06/24 KATINA    Floy Roberts, MD 10/12/24 [phone]

## 2024-10-06 NOTE — Discharge Instructions (Addendum)
 Continue your medications as prescribed.  Follow-up with primary care provider in about a week.  Return to the ER for symptoms that change or worsen if you are unable to schedule an appointment.

## 2024-10-06 NOTE — ED Provider Notes (Signed)
 [facility] Provider Note    Event Date/Time   First MD Initiated Contact with Patient 10/06/24 1312     (approximate)   History   Chest Pain and Cough   HPI  Kristen [name] is a 53 y.o. female with history of PE, DVT, GERD presents emergency department from [place] [facility].  Concerns for PE.  Patient's had cough, congestion, shortness of breath.  Currently on Xarelto .  Has been consistent with this medication.  No recent travel etc.      Physical Exam   Triage Vital Signs: ED Triage Vitals  Encounter Vitals Group     BP 10/06/24 1155 (!) 122/90     Girls Systolic BP Percentile --      Girls Diastolic BP Percentile --      Boys Systolic BP Percentile --      Boys Diastolic BP Percentile --      Pulse Rate 10/06/24 1155 80     Resp 10/06/24 1155 18     Temp 10/06/24 1156 98.3 F (36.8 C)     Temp Source 10/06/24 1156 Oral     SpO2 10/06/24 1155 98 %     Weight --      Height --      Head Circumference --      Peak Flow --      Pain Score 10/06/24 1153 2     Pain Loc --      Pain Education --      Exclude from Growth Chart --     Most recent vital signs: Vitals:   10/06/24 1156 10/06/24 1500  BP:  105/78  Pulse:  77  Resp:  19  Temp: 98.3 F (36.8 C)   SpO2:  100%     General: Awake, no distress.   CV:  Good peripheral perfusion. regular rate and  rhythm Resp:  Normal effort. Lungs cta Abd:  No distention.   Other:     ED Results / Procedures / Treatments   Labs (all labs ordered are listed, but only abnormal results are displayed) Labs Reviewed  BASIC METABOLIC PANEL WITH GFR - Abnormal; Notable for the following components:      Result Value   CO2 21 (*)    Calcium 8.8 (*)    All other components within normal limits  CBC  TROPONIN I (HIGH SENSITIVITY)  TROPONIN I (HIGH SENSITIVITY)     EKG  EKG   RADIOLOGY Chest x-ray, CTA for PE    PROCEDURES:   Procedures  Critical Care: No Chief Complaint   Patient presents with   Chest Pain   Cough      MEDICATIONS ORDERED IN ED: Medications - No data to display   IMPRESSION / MDM / ASSESSMENT AND PLAN / ED COURSE  I reviewed the triage vital signs and the nursing notes.                              Differential diagnosis includes, but is not limited to, acute URI, bronchitis, CAP, COVID, RSV, influenza, PE  Patient's presentation is most consistent with acute illness / injury with system symptoms.   Labs reassuring Chest x-ray independently reviewed interpreted by me as being negative for acute abnormality EKG shows normal sinus rhythm, no STEMI, interpret this as being normal, see physician read  CTA for PE due to patient's shortness of breath and past medical history  Care  transferred to [name], FNP at shift change   FINAL CLINICAL IMPRESSION(S) / ED DIAGNOSES   Final diagnoses:  Shortness of breath     Rx / DC Orders   ED Discharge Orders     None        Note:  This document was prepared using Dragon voice recognition software and may include unintentional dictation errors.    [name] ORN, PA-C 10/06/24 1523    Floy Roberts, MD 10/12/24 [phone]

## 2024-10-07 ENCOUNTER — Other Ambulatory Visit

## 2024-10-14 ENCOUNTER — Ambulatory Visit
Admit: 2024-10-14 | Discharge: 2024-10-14 | Disposition: A | Discharge status: Home / Self Care | Attending: Family | Admitting: Family

## 2024-10-14 ENCOUNTER — Encounter: Payer: Self-pay | Admitting: Oncology

## 2024-10-14 DIAGNOSIS — R7989 Other specified abnormal findings of blood chemistry: Secondary | ICD-10-CM

## 2024-11-11 ENCOUNTER — Other Ambulatory Visit: Payer: Self-pay | Admitting: Family Medicine

## 2024-12-13 ENCOUNTER — Other Ambulatory Visit: Payer: Self-pay | Admitting: Family Medicine

## 2025-01-13 ENCOUNTER — Encounter: Payer: Self-pay | Admitting: Oncology

## 2025-01-19 ENCOUNTER — Encounter: Payer: Self-pay | Admitting: Oncology

## 2025-01-20 ENCOUNTER — Telehealth (INDEPENDENT_AMBULATORY_CARE_PROVIDER_SITE_OTHER): Admitting: Psychiatry

## 2025-01-20 ENCOUNTER — Encounter: Payer: Self-pay | Admitting: Psychiatry

## 2025-01-20 DIAGNOSIS — E538 Deficiency of other specified B group vitamins: Secondary | ICD-10-CM

## 2025-01-20 DIAGNOSIS — F422 Mixed obsessional thoughts and acts: Secondary | ICD-10-CM

## 2025-01-20 DIAGNOSIS — F4001 Agoraphobia with panic disorder: Secondary | ICD-10-CM

## 2025-01-20 DIAGNOSIS — E611 Iron deficiency: Secondary | ICD-10-CM

## 2025-01-20 DIAGNOSIS — G43009 Migraine without aura, not intractable, without status migrainosus: Secondary | ICD-10-CM

## 2025-01-20 DIAGNOSIS — F411 Generalized anxiety disorder: Secondary | ICD-10-CM

## 2025-01-20 DIAGNOSIS — G4752 REM sleep behavior disorder: Secondary | ICD-10-CM

## 2025-01-20 DIAGNOSIS — R7989 Other specified abnormal findings of blood chemistry: Secondary | ICD-10-CM

## 2025-01-20 DIAGNOSIS — F515 Nightmare disorder: Secondary | ICD-10-CM

## 2025-01-20 DIAGNOSIS — F319 Bipolar disorder, unspecified: Secondary | ICD-10-CM

## 2025-01-20 DIAGNOSIS — F5105 Insomnia due to other mental disorder: Secondary | ICD-10-CM

## 2025-01-20 MED ORDER — TOPIRAMATE 50 MG PO TABS: ORAL_TABLET | ORAL | 1 refills | Status: AC

## 2025-01-20 MED ORDER — OXCARBAZEPINE 300 MG PO TABS: ORAL_TABLET | ORAL | 1 refills | Status: AC

## 2025-01-20 MED ORDER — LURASIDONE HCL 80 MG PO TABS: 80.0000 mg | ORAL_TABLET | Freq: Every day | ORAL | 1 refills | Status: AC

## 2025-01-20 MED ORDER — LAMOTRIGINE 200 MG PO TABS: 200.0000 mg | ORAL_TABLET | Freq: Every day | ORAL | 1 refills | Status: AC

## 2025-01-20 MED ORDER — FLUVOXAMINE MALEATE 100 MG PO TABS: 300.0000 mg | ORAL_TABLET | Freq: Every day | ORAL | 1 refills | Status: AC
